# Patient Record
Sex: Male | Born: 1985 | Race: White | Hispanic: No | Marital: Married | Smoking: Current every day smoker
Health system: Southern US, Community
[De-identification: ages and names within clinical notes are randomized; demographics above are authoritative.]

## PROBLEM LIST (undated history)

## (undated) DIAGNOSIS — K56609 Unspecified intestinal obstruction, unspecified as to partial versus complete obstruction: Secondary | ICD-10-CM

## (undated) DIAGNOSIS — Z8739 Personal history of other diseases of the musculoskeletal system and connective tissue: Secondary | ICD-10-CM

## (undated) DIAGNOSIS — K529 Noninfective gastroenteritis and colitis, unspecified: Secondary | ICD-10-CM

## (undated) HISTORY — DX: Personal history of other diseases of the musculoskeletal system and connective tissue: Z87.39

## (undated) HISTORY — PX: CHOLECYSTECTOMY: SHX55

## (undated) HISTORY — PX: ABDOMINAL SURGERY: SHX537

## (undated) HISTORY — DX: Noninfective gastroenteritis and colitis, unspecified: K52.9

## (undated) HISTORY — PX: HERNIA REPAIR: SHX51

---

## 2013-06-30 ENCOUNTER — Other Ambulatory Visit (HOSPITAL_COMMUNITY): Payer: Self-pay | Admitting: Internal Medicine

## 2013-06-30 DIAGNOSIS — M549 Dorsalgia, unspecified: Secondary | ICD-10-CM

## 2013-07-02 ENCOUNTER — Ambulatory Visit (HOSPITAL_COMMUNITY): Payer: BC Managed Care – PPO

## 2013-08-26 ENCOUNTER — Ambulatory Visit (HOSPITAL_COMMUNITY): Payer: BC Managed Care – PPO

## 2013-10-27 ENCOUNTER — Encounter (HOSPITAL_COMMUNITY): Payer: Self-pay | Admitting: Emergency Medicine

## 2013-10-27 ENCOUNTER — Encounter (HOSPITAL_COMMUNITY): Payer: Self-pay

## 2013-10-27 ENCOUNTER — Inpatient Hospital Stay (HOSPITAL_COMMUNITY)
Admission: EM | Admit: 2013-10-27 | Discharge: 2013-10-31 | DRG: 390 | Disposition: A | Discharge status: Home / Self Care | Payer: BC Managed Care – PPO | Attending: Family Medicine | Admitting: Family Medicine

## 2013-10-27 ENCOUNTER — Other Ambulatory Visit (HOSPITAL_COMMUNITY): Payer: Self-pay | Admitting: Family Medicine

## 2013-10-27 ENCOUNTER — Ambulatory Visit (HOSPITAL_COMMUNITY)
Admission: RE | Admit: 2013-10-27 | Discharge: 2013-10-27 | Disposition: A | Discharge status: Home / Self Care | Payer: BC Managed Care – PPO | Source: Ambulatory Visit | Attending: Family Medicine | Admitting: Family Medicine

## 2013-10-27 DIAGNOSIS — D72829 Elevated white blood cell count, unspecified: Secondary | ICD-10-CM | POA: Diagnosis present

## 2013-10-27 DIAGNOSIS — M545 Low back pain, unspecified: Secondary | ICD-10-CM | POA: Diagnosis present

## 2013-10-27 DIAGNOSIS — R112 Nausea with vomiting, unspecified: Secondary | ICD-10-CM | POA: Diagnosis present

## 2013-10-27 DIAGNOSIS — R109 Unspecified abdominal pain: Secondary | ICD-10-CM

## 2013-10-27 DIAGNOSIS — Z9089 Acquired absence of other organs: Secondary | ICD-10-CM

## 2013-10-27 DIAGNOSIS — K56600 Partial intestinal obstruction, unspecified as to cause: Secondary | ICD-10-CM | POA: Diagnosis present

## 2013-10-27 DIAGNOSIS — G8929 Other chronic pain: Secondary | ICD-10-CM | POA: Diagnosis present

## 2013-10-27 DIAGNOSIS — K56609 Unspecified intestinal obstruction, unspecified as to partial versus complete obstruction: Principal | ICD-10-CM

## 2013-10-27 DIAGNOSIS — F17200 Nicotine dependence, unspecified, uncomplicated: Secondary | ICD-10-CM | POA: Diagnosis present

## 2013-10-27 LAB — CBC WITH DIFFERENTIAL/PLATELET
BASOS ABS: 0 10*3/uL (ref 0.0–0.1)
Basophils Relative: 0 % (ref 0–1)
Eosinophils Absolute: 0 10*3/uL (ref 0.0–0.7)
Eosinophils Relative: 0 % (ref 0–5)
HCT: 45.5 % (ref 39.0–52.0)
Hemoglobin: 15.5 g/dL (ref 13.0–17.0)
LYMPHS PCT: 4 % — AB (ref 12–46)
Lymphs Abs: 0.5 10*3/uL — ABNORMAL LOW (ref 0.7–4.0)
MCH: 30.2 pg (ref 26.0–34.0)
MCHC: 34.1 g/dL (ref 30.0–36.0)
MCV: 88.7 fL (ref 78.0–100.0)
Monocytes Absolute: 0.9 10*3/uL (ref 0.1–1.0)
Monocytes Relative: 7 % (ref 3–12)
NEUTROS ABS: 11.4 10*3/uL — AB (ref 1.7–7.7)
Neutrophils Relative %: 89 % — ABNORMAL HIGH (ref 43–77)
PLATELETS: 228 10*3/uL (ref 150–400)
RBC: 5.13 MIL/uL (ref 4.22–5.81)
RDW: 12.9 % (ref 11.5–15.5)
WBC: 12.8 10*3/uL — AB (ref 4.0–10.5)

## 2013-10-27 LAB — URINALYSIS, ROUTINE W REFLEX MICROSCOPIC
Bilirubin Urine: NEGATIVE
Glucose, UA: NEGATIVE mg/dL
KETONES UR: NEGATIVE mg/dL
Leukocytes, UA: NEGATIVE
NITRITE: NEGATIVE
Protein, ur: NEGATIVE mg/dL
Specific Gravity, Urine: 1.01 (ref 1.005–1.030)
Urobilinogen, UA: 0.2 mg/dL (ref 0.0–1.0)
pH: 6.5 (ref 5.0–8.0)

## 2013-10-27 LAB — BASIC METABOLIC PANEL
BUN: 19 mg/dL (ref 6–23)
CHLORIDE: 97 meq/L (ref 96–112)
CO2: 25 mEq/L (ref 19–32)
Calcium: 9.1 mg/dL (ref 8.4–10.5)
Creatinine, Ser: 0.98 mg/dL (ref 0.50–1.35)
GFR calc Af Amer: >90 mL/min (ref >90)
GFR calc non Af Amer: >90 mL/min (ref >90)
Glucose, Bld: 91 mg/dL (ref 70–99)
POTASSIUM: 4 meq/L (ref 3.7–5.3)
Sodium: 137 mEq/L (ref 137–147)

## 2013-10-27 LAB — URINE MICROSCOPIC-ADD ON

## 2013-10-27 LAB — LIPASE, BLOOD: LIPASE: 15 U/L (ref 11–59)

## 2013-10-27 MED ORDER — ONDANSETRON HCL 4 MG/2ML IJ SOLN
4.0000 mg | Freq: Four times a day (QID) | INTRAMUSCULAR | Status: DC | PRN
  Administered 2013-10-28 – 2013-10-29 (×5): 4 mg via INTRAVENOUS
  Filled 2013-10-27 (×5): qty 2

## 2013-10-27 MED ORDER — LORAZEPAM 2 MG/ML IJ SOLN
INTRAMUSCULAR | Status: AC
  Filled 2013-10-27: qty 1

## 2013-10-27 MED ORDER — HYDROMORPHONE HCL PF 1 MG/ML IJ SOLN
1.0000 mg | Freq: Once | INTRAMUSCULAR | Status: AC
  Administered 2013-10-27: 1 mg via INTRAVENOUS
  Filled 2013-10-27: qty 1

## 2013-10-27 MED ORDER — MORPHINE SULFATE 4 MG/ML IJ SOLN
4.0000 mg | INTRAMUSCULAR | Status: DC | PRN
  Administered 2013-10-27 – 2013-10-28 (×4): 4 mg via INTRAVENOUS
  Filled 2013-10-27 (×4): qty 1

## 2013-10-27 MED ORDER — METRONIDAZOLE IN NACL 5-0.79 MG/ML-% IV SOLN
500.0000 mg | Freq: Three times a day (TID) | INTRAVENOUS | Status: DC
  Administered 2013-10-27 – 2013-10-30 (×8): 500 mg via INTRAVENOUS
  Filled 2013-10-27 (×8): qty 100

## 2013-10-27 MED ORDER — HYDROMORPHONE HCL PF 1 MG/ML IJ SOLN: 1.0000 mg | INTRAMUSCULAR | Status: DC | PRN

## 2013-10-27 MED ORDER — KETOROLAC TROMETHAMINE 30 MG/ML IJ SOLN
30.0000 mg | Freq: Once | INTRAMUSCULAR | Status: AC
  Administered 2013-10-27: 30 mg via INTRAVENOUS
  Filled 2013-10-27: qty 1

## 2013-10-27 MED ORDER — ONDANSETRON HCL 4 MG PO TABS: 4.0000 mg | ORAL_TABLET | Freq: Four times a day (QID) | ORAL | Status: DC | PRN

## 2013-10-27 MED ORDER — SODIUM CHLORIDE 0.9 % IV SOLN
INTRAVENOUS | Status: AC
  Administered 2013-10-27: via INTRAVENOUS

## 2013-10-27 MED ORDER — IOHEXOL 300 MG/ML  SOLN
100.0000 mL | Freq: Once | INTRAMUSCULAR | Status: AC | PRN
  Administered 2013-10-27: 100 mL via INTRAVENOUS

## 2013-10-27 MED ORDER — HYDROMORPHONE HCL PF 1 MG/ML IJ SOLN
INTRAMUSCULAR | Status: AC
  Filled 2013-10-27: qty 1

## 2013-10-27 MED ORDER — SODIUM CHLORIDE 0.9 % IV BOLUS (SEPSIS)
1000.0000 mL | Freq: Once | INTRAVENOUS | Status: AC
  Administered 2013-10-27: 1000 mL via INTRAVENOUS

## 2013-10-27 MED ORDER — LORAZEPAM 2 MG/ML IJ SOLN: 1.0000 mg | Freq: Once | INTRAMUSCULAR | Status: DC

## 2013-10-27 MED ORDER — METRONIDAZOLE IN NACL 5-0.79 MG/ML-% IV SOLN: 500.0000 mg | Freq: Three times a day (TID) | INTRAVENOUS | Status: DC

## 2013-10-27 MED ORDER — HYDROMORPHONE HCL PF 1 MG/ML IJ SOLN
1.0000 mg | INTRAMUSCULAR | Status: DC | PRN
  Administered 2013-10-27 – 2013-10-28 (×3): 1 mg via INTRAVENOUS
  Filled 2013-10-27 (×2): qty 1

## 2013-10-27 MED ORDER — HEPARIN SODIUM (PORCINE) 5000 UNIT/ML IJ SOLN
5000.0000 [IU] | Freq: Three times a day (TID) | INTRAMUSCULAR | Status: DC
  Administered 2013-10-27 – 2013-10-29 (×5): 5000 [IU] via SUBCUTANEOUS
  Filled 2013-10-27 (×6): qty 1

## 2013-10-27 MED ORDER — ONDANSETRON HCL 4 MG/2ML IJ SOLN
4.0000 mg | Freq: Once | INTRAMUSCULAR | Status: AC
  Administered 2013-10-27: 4 mg via INTRAVENOUS
  Filled 2013-10-27: qty 2

## 2013-10-27 MED ORDER — CIPROFLOXACIN IN D5W 400 MG/200ML IV SOLN
400.0000 mg | Freq: Two times a day (BID) | INTRAVENOUS | Status: DC
  Administered 2013-10-27 – 2013-10-30 (×6): 400 mg via INTRAVENOUS
  Filled 2013-10-27 (×6): qty 200

## 2013-10-27 MED ORDER — SODIUM CHLORIDE 0.9 % IV SOLN
INTRAVENOUS | Status: DC
  Administered 2013-10-27 – 2013-10-29 (×4): via INTRAVENOUS
  Administered 2013-10-30: 50 mL/h via INTRAVENOUS
  Administered 2013-10-30 (×2): via INTRAVENOUS

## 2013-10-27 NOTE — ED Notes (Signed)
Patient reports waking with left flank pain and left abd pain. Per patient constant nausea and vomiting. Patient also reports diarrhea. Per patient was sent to hospital for CT. Per CT patient has partial small bowel obstruction and CT tech was instructed to check patient into ER. Patient given 100mg  demerol and 4mg  of zofran at PCP, per patient only brief relief from nausea and pain.

## 2013-10-27 NOTE — H&P (Addendum)
[facility] History and Physical  [name] IPJ:[id number] DOB: [birth year] DOA: 10/27/2013  Referring physician: ER. PCP: [name]., MD   Chief Complaint: Abdominal pain, nausea and vomiting.  HPI: [name] [name] is a 28 y.o. male  This is a 28 year old man who gives a 12-hour history of nausea, vomiting and abdominal pain mainly on the right side. This has also been associated with diarrhea today. He has had no fever. He describes that he had symptoms of the same approximately a month ago when he went to another hospital but was discharged from the emergency room. Since this time his bowels have not been opened on a regular basis and he still has had some degree of abdominal pain. He is a history of umbilical hernia repair as well as a cholecystectomy in the past.   Review of Systems:  Constitutional:  No weight loss, night sweats, Fevers, chills, fatigue.  HEENT:  No headaches, Difficulty swallowing,Tooth/dental problems,Sore throat,  No sneezing, itching, ear ache, nasal congestion, post nasal drip,  Cardio-vascular:  No chest pain, Orthopnea, PND, swelling in lower extremities, anasarca, dizziness, palpitations   Resp:  No shortness of breath with exertion or at rest. No excess mucus, no productive cough, No non-productive cough, No coughing up of blood.No change in color of mucus.No wheezing.No chest wall deformity  Skin:  no rash or lesions.  GU:  no dysuria, change in color of urine, no urgency or frequency. No flank pain.  Musculoskeletal:  No joint pain or swelling. No decreased range of motion. No back pain.  Psych:  No change in mood or affect. No depression or anxiety. No memory loss.   History reviewed. No pertinent past medical history. Past Surgical History  Procedure Laterality Date  . Cholecystectomy     Social History:  reports that he has been smoking Cigarettes.  He has a 7 pack-year smoking history. He has never used smokeless tobacco. He reports  that he does not drink alcohol or use illicit drugs.  No Known Allergies  History reviewed. No pertinent family history.   Prior to Admission medications   Medication Sig Start Date End Date Taking? Authorizing Provider  escitalopram (LEXAPRO) 20 MG tablet Take 20 mg by mouth daily.   Yes Historical Provider, MD  HYDROcodone-acetaminophen (NORCO) 10-325 MG per tablet Take 1 tablet by mouth every 6 (six) hours as needed for moderate pain or severe pain.   Yes Historical Provider, MD   Physical Exam: Filed Vitals:   10/27/13 2010  BP: 103/39  Pulse: 81  Temp:   Resp: 20    BP 103/39  Pulse 81  Temp(Src) 99 F (37.2 C) (Oral)  Resp 20  SpO2 97%  General:  Appears uncomfortable and in pain at rest. Eyes: PERRL, normal lids, irises & conjunctiva ENT: grossly normal hearing, lips & tongue Neck: no LAD, masses or thyromegaly Cardiovascular: RRR, no m/r/g. No LE edema. Telemetry: SR, no arrhythmias  Respiratory: CTA bilaterally, no w/r/r. Normal respiratory effort. Abdomen: Tender in the right mid and lower quadrants with percussion tenderness. The abdomen is slightly distended. Skin: no rash or induration seen on limited exam Musculoskeletal: grossly normal tone BUE/BLE Psychiatric: grossly normal mood and affect, speech fluent and appropriate Neurologic: grossly non-focal.          Labs on Admission:  Basic Metabolic Panel:  Recent Labs Lab 10/27/13 1837  NA 137  K 4.0  CL 97  CO2 25  GLUCOSE 91  BUN 19  CREATININE 0.98  CALCIUM  9.1      Recent Labs Lab 10/27/13 1837  LIPASE 15    CBC:  Recent Labs Lab 10/27/13 1837  WBC 12.8*  NEUTROABS 11.4*  HGB 15.5  HCT 45.5  MCV 88.7  PLT 228   :   Radiological Exams on Admission: Ct Abdomen Pelvis W Contrast  10/27/2013   CLINICAL DATA:  Right lower quadrant pain  EXAM: CT ABDOMEN AND PELVIS WITH CONTRAST  TECHNIQUE: Multidetector CT imaging of the abdomen and pelvis was performed using the standard  protocol following bolus administration of intravenous contrast.  CONTRAST:  147mL OMNIPAQUE IOHEXOL 300 MG/ML  SOLN  COMPARISON:  CT ABD-PELV W/ CM dated 01/03/2011; CT ABD-PELV W/ CM dated 04/18/2007  FINDINGS: The lung bases are clear.  The liver demonstrates no focal abnormality. There is no intrahepatic or extrahepatic biliary ductal dilatation. The gallbladder is surgically absent. The spleen demonstrates no focal abnormality. The kidneys, adrenal glands and pancreas are normal. The bladder is unremarkable.  There are air-fluid levels present within the small bowel and colon. There is the mild small bowel dilatation in the mid right abdomen measuring up to 3.4 cm in diameter involving the proximal jejunum. There is a kinked appearance of the small bowel loop which may be secondary to adhesions. There is gastric distension. There is a normal caliber appendix in the right lower quadrant without periappendiceal inflammatory changes. There is no pneumoperitoneum, pneumatosis, or portal venous gas. There is no abdominal or pelvic free fluid. There is no lymphadenopathy.  The abdominal aorta is normal in caliber.  There are no lytic or sclerotic osseous lesions.  IMPRESSION: 1. Air-fluid levels throughout the small bowel and colon with mild small bowel dilatation involving the proximal jejunum in the mid right abdomen measuring up to 3.4 cm with a kinked appearance. The appearance is concerning for a partial small bowel obstruction with possible concomitant ileus versus enterocolitis. .   Electronically Signed   By: Kathreen Devoid   On: 10/27/2013 17:04      Assessment/Plan   1. Abdominal pain probably related to his partial small bowel obstruction but this is concerning for an inflamed viscus. 2. Partial small bowel obstruction history of previous abdominal surgery.  Plan: 1. Admit to medical floor. 2. N.p.o. empirical intravenous antibiotics with ciprofloxacin and metronidazole. 3. Intravenous fluids and  intravenous analgesia as well as antiemetics as required. 4. Surgical consultation.  Further recommendations will depend on patient's hospital progress.  Code Status: Full code   Family Communication: Discuss plan with patient and patient's mother at the bedside.   Disposition Plan: Home in medically stable.  Time spent: 30 minutes.  Doree Albee [facility] [phone].

## 2013-10-27 NOTE — ED Provider Notes (Signed)
CSN: [id number]     Arrival date & time 10/27/13  1720 History   First MD Initiated Contact with Patient 10/27/13 1812     Chief Complaint  Patient presents with  . Flank Pain  . Abdominal Pain     (Consider location/radiation/quality/duration/timing/severity/associated sxs/prior Treatment) Patient is a 28 y.o. male presenting with flank pain and abdominal pain. The history is provided by the patient.  Flank Pain Associated symptoms include abdominal pain.  Abdominal Pain  He has had episodic pain for 3 weeks. The pain returned, this morning, and was associated with frequent episodes of vomiting. Emesis was yellow colored. He also has had frequent stooling today, brown in color, and thin. His abdominal pain is mostly right sided. He denies known sick contacts. He denies fever, chills, cough, shortness of breath, chest pain, weakness, or dizziness. He saw his PCP, earlier today, and was treated with IM medications. He was then sent to the hospital for a CT scan. The CT scan returned and was abnormal, so he was sent here for treatment. There are no other known modifying factors.  History reviewed. No pertinent past medical history. Past Surgical History  Procedure Laterality Date  . Cholecystectomy     History reviewed. No pertinent family history. History  Substance Use Topics  . Smoking status: Current Every Day Smoker -- 1.00 packs/day for 7 years    Types: Cigarettes  . Smokeless tobacco: Never Used  . Alcohol Use: No    Review of Systems  Gastrointestinal: Positive for abdominal pain.  Genitourinary: Positive for flank pain.  All other systems reviewed and are negative.      Allergies  Review of patient's allergies indicates no known allergies.  Home Medications   Current Outpatient Rx  Name  Route  Sig  Dispense  Refill  . escitalopram (LEXAPRO) 20 MG tablet   Oral   Take 20 mg by mouth daily.         Marland Kitchen HYDROcodone-acetaminophen (NORCO) 10-325 MG per tablet    Oral   Take 1 tablet by mouth every 6 (six) hours as needed for moderate pain or severe pain.          BP 103/39  Pulse 81  Temp(Src) 99 F (37.2 C) (Oral)  Resp 20  SpO2 97% Physical Exam  Nursing note and vitals reviewed. Constitutional: He is oriented to person, place, and time. He appears well-developed and well-nourished. He appears distressed (He is uncomfortable).  HENT:  Head: Normocephalic and atraumatic.  Right Ear: External ear normal.  Left Ear: External ear normal.  Eyes: Conjunctivae and EOM are normal. Pupils are equal, round, and reactive to light.  Neck: Normal range of motion and phonation normal. Neck supple.  Cardiovascular: Normal rate, regular rhythm, normal heart sounds and intact distal pulses.   Pulmonary/Chest: Effort normal and breath sounds normal. He exhibits no bony tenderness.  Abdominal: Soft. Normal appearance. He exhibits no mass. There is tenderness (Diffuse tenderness, increased in the right upper quadrant). There is no rebound and no guarding.  Hyperactive bowel sounds  Musculoskeletal: Normal range of motion.  Neurological: He is alert and oriented to person, place, and time. No cranial nerve deficit or sensory deficit. He exhibits normal muscle tone. Coordination normal.  Skin: Skin is warm, dry and intact.  Psychiatric: He has a normal mood and affect. His behavior is normal. Judgment and thought content normal.    ED Course  Procedures (including critical care time)  Medications  0.9 %  sodium  chloride infusion ( Intravenous New Bag/Given 10/27/13 1946)  sodium chloride 0.9 % bolus 1,000 mL (0 mLs Intravenous Stopped 10/27/13 1944)  ondansetron (ZOFRAN) injection 4 mg (4 mg Intravenous Given 10/27/13 1850)  HYDROmorphone (DILAUDID) injection 1 mg (1 mg Intravenous Given 10/27/13 1851)  HYDROmorphone (DILAUDID) injection 1 mg (1 mg Intravenous Given 10/27/13 1944)    Patient Vitals for the past 24 hrs:  BP Temp Temp src Pulse Resp SpO2   10/27/13 2010 103/39 mmHg - Other 81 20 97 %  10/27/13 1924 102/47 mmHg 99 F (37.2 C) Oral 95 - 100 %  10/27/13 1906 119/55 mmHg - - 79 18 100 %  10/27/13 1807 126/50 mmHg 98.1 F (36.7 C) Oral 91 20 100 %   1833- consultation with general surgery; Dr. Arnoldo Morale. He feels that since the patient is stooling that he will not require aggressive treatment, including surgery, at this time. He will see the patient on an as-needed basis.   8:33 PM Reevaluation with update and discussion. After initial assessment and treatment, an updated evaluation reveals he has required a second dose of analgesia. He only has transient relief with this. He has continued to have frequent stools here in the emergency department. His nausea is improved with Zofran, and he has not vomited since 3:30 today. Findings discussed with patient and his mother, all questions answered. Vidur Knust L   8:35 PM-Consult complete with hospitalist, Dr.  Anastasio Champion . Patient case explained and discussed. He agrees to admit patient for further evaluation and treatment. Call ended at [place] Reviewed  CBC WITH DIFFERENTIAL - Abnormal; Notable for the following:    WBC 12.8 (*)    Neutrophils Relative % 89 (*)    Neutro Abs 11.4 (*)    Lymphocytes Relative 4 (*)    Lymphs Abs 0.5 (*)    All other components within normal limits  URINALYSIS, ROUTINE W REFLEX MICROSCOPIC - Abnormal; Notable for the following:    Hgb urine dipstick SMALL (*)    All other components within normal limits  BASIC METABOLIC PANEL  LIPASE, BLOOD  URINE MICROSCOPIC-ADD ON   Imaging Review Ct Abdomen Pelvis W Contrast  10/27/2013   CLINICAL DATA:  Right lower quadrant pain  EXAM: CT ABDOMEN AND PELVIS WITH CONTRAST  TECHNIQUE: Multidetector CT imaging of the abdomen and pelvis was performed using the standard protocol following bolus administration of intravenous contrast.  CONTRAST:  187mL OMNIPAQUE IOHEXOL 300 MG/ML  SOLN  COMPARISON:  CT  ABD-PELV W/ CM dated 01/03/2011; CT ABD-PELV W/ CM dated 04/18/2007  FINDINGS: The lung bases are clear.  The liver demonstrates no focal abnormality. There is no intrahepatic or extrahepatic biliary ductal dilatation. The gallbladder is surgically absent. The spleen demonstrates no focal abnormality. The kidneys, adrenal glands and pancreas are normal. The bladder is unremarkable.  There are air-fluid levels present within the small bowel and colon. There is the mild small bowel dilatation in the mid right abdomen measuring up to 3.4 cm in diameter involving the proximal jejunum. There is a kinked appearance of the small bowel loop which may be secondary to adhesions. There is gastric distension. There is a normal caliber appendix in the right lower quadrant without periappendiceal inflammatory changes. There is no pneumoperitoneum, pneumatosis, or portal venous gas. There is no abdominal or pelvic free fluid. There is no lymphadenopathy.  The abdominal aorta is normal in caliber.  There are no lytic or sclerotic osseous lesions.  IMPRESSION: 1. Air-fluid levels  throughout the small bowel and colon with mild small bowel dilatation involving the proximal jejunum in the mid right abdomen measuring up to 3.4 cm with a kinked appearance. The appearance is concerning for a partial small bowel obstruction with possible concomitant ileus versus enterocolitis. .   Electronically Signed   By: Kathreen Devoid   On: 10/27/2013 17:04      MDM   Final diagnoses:  Partial small bowel obstruction  Abdominal pain    Abdominal pain, with vomiting and diarrhea; and CT evidence for partial SBO. No current indication for NG drainage. Persistent pain will require admission for further treatment in an observed setting; hospital.  Nursing Notes Reviewed/ Care Coordinated, and agree without changes. Applicable Imaging Reviewed.  Interpretation of Laboratory Data incorporated into ED treatment  Plan: admit    Richarda Blade, MD 10/28/13 1110

## 2013-10-28 ENCOUNTER — Observation Stay (HOSPITAL_COMMUNITY): Payer: BC Managed Care – PPO

## 2013-10-28 DIAGNOSIS — R112 Nausea with vomiting, unspecified: Secondary | ICD-10-CM

## 2013-10-28 DIAGNOSIS — R109 Unspecified abdominal pain: Secondary | ICD-10-CM

## 2013-10-28 DIAGNOSIS — K56609 Unspecified intestinal obstruction, unspecified as to partial versus complete obstruction: Secondary | ICD-10-CM

## 2013-10-28 LAB — COMPREHENSIVE METABOLIC PANEL
ALBUMIN: 3.1 g/dL — AB (ref 3.5–5.2)
ALK PHOS: 41 U/L (ref 39–117)
ALT: 27 U/L (ref 0–53)
AST: 26 U/L (ref 0–37)
BUN: 14 mg/dL (ref 6–23)
CO2: 28 mEq/L (ref 19–32)
Calcium: 7.5 mg/dL — ABNORMAL LOW (ref 8.4–10.5)
Chloride: 102 mEq/L (ref 96–112)
Creatinine, Ser: 0.98 mg/dL (ref 0.50–1.35)
GFR calc Af Amer: >90 mL/min (ref >90)
GFR calc non Af Amer: >90 mL/min (ref >90)
GLUCOSE: 89 mg/dL (ref 70–99)
POTASSIUM: 3.4 meq/L — AB (ref 3.7–5.3)
SODIUM: 139 meq/L (ref 137–147)
Total Bilirubin: 1.6 mg/dL — ABNORMAL HIGH (ref 0.3–1.2)
Total Protein: 6.2 g/dL (ref 6.0–8.3)

## 2013-10-28 LAB — CBC
HEMATOCRIT: 39.9 % (ref 39.0–52.0)
Hemoglobin: 13.3 g/dL (ref 13.0–17.0)
MCH: 29.9 pg (ref 26.0–34.0)
MCHC: 33.3 g/dL (ref 30.0–36.0)
MCV: 89.7 fL (ref 78.0–100.0)
Platelets: 187 10*3/uL (ref 150–400)
RBC: 4.45 MIL/uL (ref 4.22–5.81)
RDW: 13 % (ref 11.5–15.5)
WBC: 6.5 10*3/uL (ref 4.0–10.5)

## 2013-10-28 MED ORDER — DIPHENHYDRAMINE HCL 50 MG/ML IJ SOLN
25.0000 mg | Freq: Once | INTRAMUSCULAR | Status: AC
  Administered 2013-10-28: 25 mg via INTRAVENOUS
  Filled 2013-10-28: qty 1

## 2013-10-28 MED ORDER — HYDROMORPHONE HCL PF 1 MG/ML IJ SOLN
1.0000 mg | INTRAMUSCULAR | Status: DC | PRN
  Administered 2013-10-28 – 2013-10-29 (×18): 2 mg via INTRAVENOUS
  Administered 2013-10-29: 1 mg via INTRAVENOUS
  Administered 2013-10-30 (×3): 2 mg via INTRAVENOUS
  Administered 2013-10-30: 1 mg via INTRAVENOUS
  Filled 2013-10-28 (×2): qty 2
  Filled 2013-10-28: qty 1
  Filled 2013-10-28 (×17): qty 2
  Filled 2013-10-28: qty 1
  Filled 2013-10-28 (×2): qty 2

## 2013-10-28 MED ORDER — PANTOPRAZOLE SODIUM 40 MG IV SOLR
40.0000 mg | INTRAVENOUS | Status: DC
Start: 2013-10-28 — End: 2013-10-30
  Administered 2013-10-28 – 2013-10-29 (×2): 40 mg via INTRAVENOUS
  Filled 2013-10-28 (×2): qty 40

## 2013-10-28 MED ORDER — PHENOL 1.4 % MT LIQD
1.0000 | OROMUCOSAL | Status: DC | PRN
  Administered 2013-10-28: 1 via OROMUCOSAL
  Filled 2013-10-28: qty 177

## 2013-10-28 MED ORDER — INFLUENZA VAC SPLIT QUAD 0.5 ML IM SUSP
0.5000 mL | INTRAMUSCULAR | Status: DC
  Filled 2013-10-28: qty 0.5

## 2013-10-28 NOTE — Consult Note (Signed)
Referring Provider: No ref. provider found Primary Care Physician:  [name]., MD Primary Gastroenterologist:  Dr. Laural Golden  Reason for Consultation:  Abdominal pain nausea vomiting and abnormal abdominopelvic CT.  HPI:  History is provided by the patient and his wife and mother. Patient is 28 year old Caucasian male who was in usual state of health about a month ago when he developed diarrhea nausea and vomiting while at work. He went to the emergency room at [facility] in [facility]. He he got frustrated after having to wait for 6 hours and left without being seen. Over the next day or 2 he felt better and return to work. He has had intermittent diarrhea but no nausea vomiting abdominal pain melena or rectal bleeding. Yesterday morning he developed severe abdominal pain primarily in the right upper quadrant associated with nausea vomiting and diarrhea. He states pain is worse when he takes a deep breath or coughs. He had 3 loose stools. He did vomit scant amount of dark blood. He did not experience fever.  Initial blood work revealed mild leukocytosis with WBC of 12.8 and 89% neutrophils. Metabolic 7 and serum lipase were normal. Abdominal pelvic CT revealed air fluid levels involving small and large bowel and dilation of proximal small bowel; radiologist raised concern for a kink. Patient was begun on IV fluids analgesia antibiotics and IV antibiotics. However he has continued to complain of pain which is not controlled with pain medications. He feels the pain has gotten worse. NG tube was placed today so far he has not felt any better. His bowels have not moved since 10 PM last night and he also has not passed any flatus. Review of the systems is as follows ; He has not lost any weight since he had an episode one month ago. He does not take OTC NSAIDs. He has chronic low back pain for which she takes pain medications but not on daily basis. He has had a rash involving proximal thighs which  is being treated with topical medication.  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 He drinks alcohol occasionally and smokes cigarettes about a pack a day. History reviewed. No pertinent past medical history.  Past Surgical History  Procedure Laterality Date  . Cholecystectomy at age 57 for poorly functioning gallbladder by Dr. Anthony Sar.    Umbilical herniorrhaphy at age 57. He has chronic low back pain which started after the fell off the roof 5 or 6 years ago. His mother states he has spondylosis.  Prior to Admission medications   Medication Sig Start Date End Date Taking? Authorizing Provider  escitalopram (LEXAPRO) 20 MG tablet Take 20 mg by mouth daily.   Yes Historical Provider, MD  HYDROcodone-acetaminophen (NORCO) 10-325 MG per tablet Take 1 tablet by mouth every 6 (six) hours as needed for moderate pain or severe pain.   Yes Historical Provider, MD    Current Facility-Administered Medications  Medication Dose Route Frequency Provider Last Rate Last Dose  . 0.9 %  sodium chloride infusion   Intravenous Continuous Richarda Blade, MD 125 mL/hr at 10/27/13 1946    . ciprofloxacin (CIPRO) IVPB 400 mg  400 mg Intravenous Q12H Nimish C Gosrani, MD   400 mg at 10/28/13 0937  . heparin injection 5,000 Units  5,000 Units Subcutaneous 3 times per day Doree Albee, MD   5,000 Units at 10/28/13 1414  . HYDROmorphone (DILAUDID) injection 1-2 mg  1-2 mg Intravenous Q2H PRN Nita Sells, MD  2 mg at 10/28/13 1739  . LORazepam (ATIVAN) injection 1 mg  1 mg Intravenous Once Jeryl Columbia, NP      . metroNIDAZOLE (FLAGYL) IVPB 500 mg  500 mg Intravenous Q8H Nimish C Gosrani, MD   500 mg at 10/28/13 1415  . ondansetron (ZOFRAN) tablet 4 mg  4 mg Oral Q6H PRN Nimish C Gosrani, MD       Or  . ondansetron (ZOFRAN) injection 4 mg  4 mg Intravenous Q6H PRN Doree Albee, MD   4 mg at 10/28/13 0934  . phenol (CHLORASEPTIC) mouth spray 1 spray  1 spray Mouth/Throat PRN  Nita Sells, MD   1 spray at 10/28/13 1215    Allergies as of 10/27/2013  . (No Known Allergies)    History reviewed. No pertinent family history.  History   Social History  . Marital Status: Married    Spouse Name: N/A    Number of Children: N/A  . Years of Education: N/A   Occupational History  . Not on file.   Social History Main Topics  . Smoking status: Current Every Day Smoker -- 1.00 packs/day for 7 years    Types: Cigarettes  . Smokeless tobacco: Never Used  . Alcohol Use: No  . Drug Use: No  . Sexual Activity: Not on file   Other Topics Concern  . Not on file   Social History Narrative  . No narrative on file    Review of Systems: See HPI, otherwise normal ROS  Physical Exam: Temp:  [97.4 F (36.3 C)-99 F (37.2 C)] 97.4 F (36.3 C) (03/18 1427) Pulse Rate:  [66-95] 70 (03/18 1427) Resp:  [15-20] 20 (03/18 1427) BP: (99-126)/(39-55) 105/45 mmHg (03/18 1427) SpO2:  [95 %-100 %] 95 % (03/18 1427) Weight:  [200 lb 11.2 oz (91.037 kg)] 200 lb 11.2 oz (91.037 kg) (03/18 1057) Last BM Date: 10/27/13 Well-developed well-nourished Caucasian male who is alert and appears to be uncomfortable. He is coughing and spitting up thick saliva. NG tube is in place draining bilious fluid. Conjunctiva is pink. Sclerae nonicteric. Oropharyngeal mucosa is normal. No neck masses or thyromegaly noted. Cardiac exam with regular rhythm normal S1 and S2. No murmur or gallop noted. Lungs are clear to auscultation. Abdomen is full and symmetrical. Bowel sounds are hypoactive. Abdomen is soft with mild tenderness across cross lower abdomen and at LUQ. He has moderate tenderness with guarding in the right upper quadrant. No organomegaly or masses noted. He does not have clubbing or peripheral edema but he had maculopapular rash involving the medial aspect of both thighs.  Intake/Output from previous day: 03/17 0701 - 03/18 0700 In: 1000 [I.V.:1000] Out: -   Intake/Output this shift: Total I/O In: 300 [IV Piggyback:300] Out: 550 [Urine:550]  Lab Results:  Recent Labs  10/27/13 1837 10/28/13 0515  WBC 12.8* 6.5  HGB 15.5 13.3  HCT 45.5 39.9  PLT 228 187   BMET  Recent Labs  10/27/13 1837 10/28/13 0515  NA 137 139  K 4.0 3.4*  CL 97 102  CO2 25 28  GLUCOSE 91 89  BUN 19 14  CREATININE 0.98 0.98  CALCIUM 9.1 7.5*   LFT  Recent Labs  10/28/13 0515  PROT 6.2  ALBUMIN 3.1*  AST 26  ALT 27  ALKPHOS 41  BILITOT 1.6*   PT/INR No results found for this basename: LABPROT, INR,  in the last 72 hours Hepatitis Panel No results found for this basename: HEPBSAG, [place], HEPAIGM,  HEPBIGM,  in the last 72 hours  Studies/Results: Ct Abdomen Pelvis W Contrast  10/27/2013   CLINICAL DATA:  Right lower quadrant pain  EXAM: CT ABDOMEN AND PELVIS WITH CONTRAST  TECHNIQUE: Multidetector CT imaging of the abdomen and pelvis was performed using the standard protocol following bolus administration of intravenous contrast.  CONTRAST:  13mL OMNIPAQUE IOHEXOL 300 MG/ML  SOLN  COMPARISON:  CT ABD-PELV W/ CM dated 01/03/2011; CT ABD-PELV W/ CM dated 04/18/2007  FINDINGS: The lung bases are clear.  The liver demonstrates no focal abnormality. There is no intrahepatic or extrahepatic biliary ductal dilatation. The gallbladder is surgically absent. The spleen demonstrates no focal abnormality. The kidneys, adrenal glands and pancreas are normal. The bladder is unremarkable.  There are air-fluid levels present within the small bowel and colon. There is the mild small bowel dilatation in the mid right abdomen measuring up to 3.4 cm in diameter involving the proximal jejunum. There is a kinked appearance of the small bowel loop which may be secondary to adhesions. There is gastric distension. There is a normal caliber appendix in the right lower quadrant without periappendiceal inflammatory changes. There is no pneumoperitoneum, pneumatosis, or portal venous  gas. There is no abdominal or pelvic free fluid. There is no lymphadenopathy.  The abdominal aorta is normal in caliber.  There are no lytic or sclerotic osseous lesions.  IMPRESSION: 1. Air-fluid levels throughout the small bowel and colon with mild small bowel dilatation involving the proximal jejunum in the mid right abdomen measuring up to 3.4 cm with a kinked appearance. The appearance is concerning for a partial small bowel obstruction with possible concomitant ileus versus enterocolitis. .   Electronically Signed   By: Kathreen Devoid   On: 10/27/2013 17:04   Dg Abd 2 Views  10/28/2013   CLINICAL DATA:  Abdominal pain with nausea and vomiting  EXAM: ABDOMEN - 2 VIEW  COMPARISON:  October 27, 2013  FINDINGS: Supine and upright images were obtained. There remains multiple loops of mildly dilated small bowel with scattered air-fluid levels. Contrast does reach the colon and is largely within the colon currently. There is no free air. Liver is prominent. There are surgical clips in the gallbladder fossa region.  IMPRESSION: Bowel gas pattern remains concerning for a degree of bowel obstruction. Contrast reaches the colon, indicating that bowel obstruction is partial as opposed to complete. Liver is prominent. No free air.   Electronically Signed   By: Lowella Grip M.D.   On: 10/28/2013 08:40    Assessment; Patient is 28 year old Caucasian male who presents with acute onset of abdominal pain centered in the right upper quadrant associated with nausea and vomiting. He had an episode of nausea vomiting and diarrhea about a month ago for which he went to emergency room at [facility] and left after waiting for 6 hours. He has had intermittent diarrhea since then. Current symptoms began with pain yesterday morning leading to admission last night. Workup revealed dilated loops of small bowel centered in the right upper quadrant along with air-fluid levels in the colon and abdominal series from today reveal contrast in  the colon and dilated loops of small intestine. NG tube was placed earlier today but he has not experienced relief of pain. Abdominal exam reveals hypoactive bowel sounds soft abdomen with generalized tenderness but more so in the right upper quadrant with guarding. His presentation is suspicious for partial small bowel obstruction and I doubt that we're dealing with enterocolitis. I am concerned that  he has partial obstruction secondary to adhesions or could have internal herniation. Therefore surgical consultation will be requested. He also  experienced hematemesis yesterday which would appear to be most likely due to Mallory-Weiss tear.   Recommendations; Surgical consultation. I have contacted Dr. Aviva Signs and he will evaluate patient later this evening. Pantoprazole 40 mg IV every 24 hours. CBC and metabolic 7 in am.   LOS: 1 day   [name],NAJEEB U  10/28/2013, 5:42 PM

## 2013-10-28 NOTE — Consult Note (Signed)
Reason for Consult: Abdominal pain Referring Physician: [facility]  [name] [name] is an 28 y.o. male.  HPI: Patient is a 28 year old white male who presents with worsening abdominal pain and intermittent diarrhea. Please see Dr. Olevia Perches note concerning his history. I have been asked to see him as he continues to have abdominal pain requiring frequent narcotic dosing. An NG tube has been placed, but he states that his pain has not significantly improved. He has not had a bowel movement or flatus since being admitted to the hospital. He did have a followup KUB today which showed contrast from his previous CT scan to be within the colon. There was some air within the small bowel, but no worsening dilatation as compared to the CT scan done yesterday evening. His white blood cell count has normalized. He has had a previous laparoscopic cholecystectomy and umbilical herniorrhaphy in the remote past. He points to discomfort in the right upper quadrant of the abdomen.  History reviewed. No pertinent past medical history.  Past Surgical History  Procedure Laterality Date  . Cholecystectomy      History reviewed. No pertinent family history.  Social History:  reports that he has been smoking Cigarettes.  He has a 7 pack-year smoking history. He has never used smokeless tobacco. He reports that he does not drink alcohol or use illicit drugs.  Allergies: No Known Allergies  Medications: I have reviewed the patient's current medications.  Results for orders placed during the hospital encounter of 10/27/13 (from the past 48 hour(s))  CBC WITH DIFFERENTIAL     Status: Abnormal   Collection Time    10/27/13  6:37 PM      Result Value Ref Range   WBC 12.8 (*) 4.0 - 10.5 K/uL   RBC 5.13  4.22 - 5.81 MIL/uL   Hemoglobin 15.5  13.0 - 17.0 g/dL   HCT 45.5  39.0 - 52.0 %   MCV 88.7  78.0 - 100.0 fL   MCH 30.2  26.0 - 34.0 pg   MCHC 34.1  30.0 - 36.0 g/dL   RDW 12.9  11.5 - 15.5 %   Platelets 228   150 - 400 K/uL   Neutrophils Relative % 89 (*) 43 - 77 %   Neutro Abs 11.4 (*) 1.7 - 7.7 K/uL   Lymphocytes Relative 4 (*) 12 - 46 %   Lymphs Abs 0.5 (*) 0.7 - 4.0 K/uL   Monocytes Relative 7  3 - 12 %   Monocytes Absolute 0.9  0.1 - 1.0 K/uL   Eosinophils Relative 0  0 - 5 %   Eosinophils Absolute 0.0  0.0 - 0.7 K/uL   Basophils Relative 0  0 - 1 %   Basophils Absolute 0.0  0.0 - 0.1 K/uL  BASIC METABOLIC PANEL     Status: None   Collection Time    10/27/13  6:37 PM      Result Value Ref Range   Sodium 137  137 - 147 mEq/L   Potassium 4.0  3.7 - 5.3 mEq/L   Chloride 97  96 - 112 mEq/L   CO2 25  19 - 32 mEq/L   Glucose, Bld 91  70 - 99 mg/dL   BUN 19  6 - 23 mg/dL   Creatinine, Ser 0.98  0.50 - 1.35 mg/dL   Calcium 9.1  8.4 - 10.5 mg/dL   GFR calc non Af Amer >90  >90 mL/min   GFR calc Af Amer >90  >90 mL/min  Comment: (NOTE)     The eGFR has been calculated using the CKD EPI equation.     This calculation has not been validated in all clinical situations.     eGFR's persistently <90 mL/min signify possible Chronic Kidney     Disease.  LIPASE, BLOOD     Status: None   Collection Time    10/27/13  6:37 PM      Result Value Ref Range   Lipase 15  11 - 59 U/L  URINALYSIS, ROUTINE W REFLEX MICROSCOPIC     Status: Abnormal   Collection Time    10/27/13  7:28 PM      Result Value Ref Range   Color, Urine YELLOW  YELLOW   APPearance CLEAR  CLEAR   Specific Gravity, Urine 1.010  1.005 - 1.030   pH 6.5  5.0 - 8.0   Glucose, UA NEGATIVE  NEGATIVE mg/dL   Hgb urine dipstick SMALL (*) NEGATIVE   Bilirubin Urine NEGATIVE  NEGATIVE   Ketones, ur NEGATIVE  NEGATIVE mg/dL   Protein, ur NEGATIVE  NEGATIVE mg/dL   Urobilinogen, UA 0.2  0.0 - 1.0 mg/dL   Nitrite NEGATIVE  NEGATIVE   Leukocytes, UA NEGATIVE  NEGATIVE  URINE MICROSCOPIC-ADD ON     Status: None   Collection Time    10/27/13  7:28 PM      Result Value Ref Range   Squamous Epithelial / LPF RARE  RARE   WBC, UA 0-2   <3 WBC/hpf   RBC / HPF 0-2  <3 RBC/hpf   Bacteria, UA RARE  RARE  COMPREHENSIVE METABOLIC PANEL     Status: Abnormal   Collection Time    10/28/13  5:15 AM      Result Value Ref Range   Sodium 139  137 - 147 mEq/L   Potassium 3.4 (*) 3.7 - 5.3 mEq/L   Chloride 102  96 - 112 mEq/L   CO2 28  19 - 32 mEq/L   Glucose, Bld 89  70 - 99 mg/dL   BUN 14  6 - 23 mg/dL   Creatinine, Ser 0.98  0.50 - 1.35 mg/dL   Calcium 7.5 (*) 8.4 - 10.5 mg/dL   Total Protein 6.2  6.0 - 8.3 g/dL   Albumin 3.1 (*) 3.5 - 5.2 g/dL   AST 26  0 - 37 U/L   ALT 27  0 - 53 U/L   Alkaline Phosphatase 41  39 - 117 U/L   Total Bilirubin 1.6 (*) 0.3 - 1.2 mg/dL   GFR calc non Af Amer >90  >90 mL/min   GFR calc Af Amer >90  >90 mL/min   Comment: (NOTE)     The eGFR has been calculated using the CKD EPI equation.     This calculation has not been validated in all clinical situations.     eGFR's persistently <90 mL/min signify possible Chronic Kidney     Disease.  CBC     Status: None   Collection Time    10/28/13  5:15 AM      Result Value Ref Range   WBC 6.5  4.0 - 10.5 K/uL   RBC 4.45  4.22 - 5.81 MIL/uL   Hemoglobin 13.3  13.0 - 17.0 g/dL   HCT 39.9  39.0 - 52.0 %   MCV 89.7  78.0 - 100.0 fL   MCH 29.9  26.0 - 34.0 pg   MCHC 33.3  30.0 - 36.0 g/dL   RDW 13.0  11.5 - 15.5 %   Platelets 187  150 - 400 K/uL    Ct Abdomen Pelvis W Contrast  10/27/2013   CLINICAL DATA:  Right lower quadrant pain  EXAM: CT ABDOMEN AND PELVIS WITH CONTRAST  TECHNIQUE: Multidetector CT imaging of the abdomen and pelvis was performed using the standard protocol following bolus administration of intravenous contrast.  CONTRAST:  185m OMNIPAQUE IOHEXOL 300 MG/ML  SOLN  COMPARISON:  CT ABD-PELV W/ CM dated 01/03/2011; CT ABD-PELV W/ CM dated 04/18/2007  FINDINGS: The lung bases are clear.  The liver demonstrates no focal abnormality. There is no intrahepatic or extrahepatic biliary ductal dilatation. The gallbladder is surgically absent.  The spleen demonstrates no focal abnormality. The kidneys, adrenal glands and pancreas are normal. The bladder is unremarkable.  There are air-fluid levels present within the small bowel and colon. There is the mild small bowel dilatation in the mid right abdomen measuring up to 3.4 cm in diameter involving the proximal jejunum. There is a kinked appearance of the small bowel loop which may be secondary to adhesions. There is gastric distension. There is a normal caliber appendix in the right lower quadrant without periappendiceal inflammatory changes. There is no pneumoperitoneum, pneumatosis, or portal venous gas. There is no abdominal or pelvic free fluid. There is no lymphadenopathy.  The abdominal aorta is normal in caliber.  There are no lytic or sclerotic osseous lesions.  IMPRESSION: 1. Air-fluid levels throughout the small bowel and colon with mild small bowel dilatation involving the proximal jejunum in the mid right abdomen measuring up to 3.4 cm with a kinked appearance. The appearance is concerning for a partial small bowel obstruction with possible concomitant ileus versus enterocolitis. .   Electronically Signed   By: HKathreen Devoid  On: 10/27/2013 17:04   Dg Abd 2 Views  10/28/2013   CLINICAL DATA:  Abdominal pain with nausea and vomiting  EXAM: ABDOMEN - 2 VIEW  COMPARISON:  October 27, 2013  FINDINGS: Supine and upright images were obtained. There remains multiple loops of mildly dilated small bowel with scattered air-fluid levels. Contrast does reach the colon and is largely within the colon currently. There is no free air. Liver is prominent. There are surgical clips in the gallbladder fossa region.  IMPRESSION: Bowel gas pattern remains concerning for a degree of bowel obstruction. Contrast reaches the colon, indicating that bowel obstruction is partial as opposed to complete. Liver is prominent. No free air.   Electronically Signed   By: WLowella GripM.D.   On: 10/28/2013 08:40    ROS:  See chart Blood pressure 104/51, pulse 93, temperature 98.1 F (36.7 C), temperature source Oral, resp. rate 20, height 5' 10"  (1.778 m), weight 91.037 kg (200 lb 11.2 oz), SpO2 93.00%. Physical Exam: Pleasant white male who is resting comfortably but frequently will sleep do to narcotics. Abdomen is soft with nonspecific tenderness throughout the abdomen. I do not appreciate any distention. It was difficult to feel for any hepatosplenomegaly, masses, or hernias due to patient discomfort. He did not have a rigid abdomen to my examination.  Assessment/Plan: Impression: Abdominal pain of unknown etiology. He is not acting like a bowel obstruction secondary to adhesive disease. Contrast has gone through to the colon and even a partial bowel obstruction would not have ongoing abdominal pain with an NG tube in place in the bowel starting to decompress. Bowel compromise was not seen on the CT scan the abdomen and his white blood cell count has  normalized. This still appears to me to be an inflammatory process of the bowel. I did discuss his care with his family and Dr. Laural Golden. Should he not improve over the next 24-36 hours, an exploratory laparotomy may be indicated. He does not need acute surgical invention right now. I would like to see how his course progresses.  Sarkis Rhines A 10/28/2013, 9:22 PM

## 2013-10-28 NOTE — Progress Notes (Signed)
Note: This document was prepared with digital dictation and possible smart phrase technology. Any transcriptional errors that result from this process are unintentional.   [name] [name] TOI:[id number] DOB: Sep 03, 1985 DOA: 10/27/2013 PCP: [name]., MD  Brief narrative: 28 y/o ?, admitted 10/27/13 with nausea vomiting abdominal pain occurring since 10/26/13. States that he had an episode of this about a month ago at [facility] but state 8 hours in the emergency room went home and had no recurrence of nausea vomiting or diarrhea which he had initially. Known prior history of cholecystectomy 8 years ago 8338, and umbilical hernia surgery 2009 both at [facility]  Past medical history-As per Problem list Chart reviewed as  Consultants:  Conferred  with the general surgeon  Gastroenterology consult 3/18  Procedures:  CT scan abdomen and pelvis 3/17  Abdominal x-ray 3/18  Antibiotics:  Ciprofloxacin 3/17  Flagyl 3/17   Subjective  States 10/10 pain, however looks comfortable otherwise. Multiple family members at bedside insistent that he "be transfred for better care" and concerned about this being cancerous in origin as maternal GM had breast, colon, ovarian  NO CP but "cannot breathe' Wife relates has chronic LBP still under work-up and takes Percoet for this only occasionally     Objective    Interim History: None  Telemetry: None   Objective: Filed Vitals:   10/27/13 1924 10/27/13 2010 10/27/13 2201 10/28/13 0500  BP: 102/47 103/39 102/41 99/41  Pulse: 95 81 79 66  Temp: 99 F (37.2 C)  98.1 F (36.7 C) 98.1 F (36.7 C)  TempSrc: Oral Other (Comment)  Oral  Resp:  20 20 15   SpO2: 100% 97% 97% 100%    Intake/Output Summary (Last 24 hours) at 10/28/13 2505 Last data filed at 10/27/13 1944  Gross per 24 hour  Intake   1000 ml  Output      0 ml  Net   1000 ml    Exam:  General: EOMI, NCAT Cardiovascular: s1 s2 no m/r/g Respiratory: clear, no  added sound Abdomen: soft, BS+, Generlized tenderness in Lower quadrants of abdomen Skin no LE edemka Neuro Intact  Data Reviewed: Basic Metabolic Panel:  Recent Labs Lab 10/27/13 1837 10/28/13 0515  NA 137 139  K 4.0 3.4*  CL 97 102  CO2 25 28  GLUCOSE 91 89  BUN 19 14  CREATININE 0.98 0.98  CALCIUM 9.1 7.5*   Liver Function Tests:  Recent Labs Lab 10/28/13 0515  AST 26  ALT 27  ALKPHOS 41  BILITOT 1.6*  PROT 6.2  ALBUMIN 3.1*    Recent Labs Lab 10/27/13 1837  LIPASE 15   No results found for this basename: AMMONIA,  in the last 168 hours CBC:  Recent Labs Lab 10/27/13 1837 10/28/13 0515  WBC 12.8* 6.5  NEUTROABS 11.4*  --   HGB 15.5 13.3  HCT 45.5 39.9  MCV 88.7 89.7  PLT 228 187   Cardiac Enzymes: No results found for this basename: CKTOTAL, CKMB, CKMBINDEX, TROPONINI,  in the last 168 hours BNP: No components found with this basename: POCBNP,  CBG: No results found for this basename: GLUCAP,  in the last 168 hours  No results found for this or any previous visit (from the past 240 hour(s)).   Studies:              All Imaging reviewed and is as per above notation   Scheduled Meds: . sodium chloride   Intravenous STAT  . ciprofloxacin  400  mg Intravenous Q12H  . heparin  5,000 Units Subcutaneous 3 times per day  . LORazepam      . LORazepam  1 mg Intravenous Once  . metronidazole  500 mg Intravenous Q8H   Continuous Infusions: . sodium chloride 125 mL/hr at 10/27/13 1946     Assessment/Plan: 1. Partial SBO-confirmed on abdominal x-ray 3/18. Will need NG tube to decompress abdomen. Have explained this clearly to family. Differential diagnosis equal size potential Crohn's stricture vs. ulcerative colitis causing bimodal diarrhea and nausea vomiting. Will ask gastroenterology for assistance. I have discussed extensively with the general surgeon Dr. Arnoldo Morale patient's films and scans and currently there is no role for surgical intervention  at this stage. I have also clearly explained in detail to family [mother, aunt, wif at bedside that small bowel obstructions] may take 24-96 hours to resolve and if no symptom at resolution, only then would we consider surgical palliation.  I very much appreciate general surgeon input and appreciate in advance gastroenterology input-for now I will continue empiric ciprofloxacin + Flagyl, but without further white count this morning, feel that this may be overkill and may discontinue it tomorrow-labs in a.m. ordered, abdominal x-ray a.m. ordered 2. Chronic low back pain-I mentioned that there is a role for narcotic discuss the R/b/a opiates in terms of slowing down peristalsis -patient clearly understands and I will change his morphine to get out to see if this helps with   Code Status: Full  Family Communication: Discussed in detail at bedside with family , greater than 20 minutes  Disposition Plan: Inpatient   Verneita Griffes, MD  [facility] Pager [phone] 10/28/2013, 9:22 AM    LOS: 1 day

## 2013-10-28 NOTE — Care Management Utilization Note (Signed)
UR completed 

## 2013-10-28 NOTE — Care Management Note (Signed)
    Page 1 of 1   10/28/2013     1:17:30 PM   CARE MANAGEMENT NOTE 10/28/2013  Patient:  [name] [name]   Account Number:  [id number]  Date Initiated:  10/28/2013  Documentation initiated by:  Claretha Cooper  Subjective/Objective Assessment:   Pt admitted from home where he lives with his girlfriend. At DC, plans to return home. No HH/DME needs identified or anticipated.,     Action/Plan:   Anticipated DC Date:     Anticipated DC Plan:  HOME/SELF CARE         Choice offered to / List presented to:             Status of service:  Completed, signed off Medicare Important Message given?   (If response is "NO", the following Medicare IM given date fields will be blank) Date Medicare IM given:   Date Additional Medicare IM given:    Discharge Disposition:    Per UR Regulation:    If discussed at Long Length of Stay Meetings, dates discussed:    Comments:  10/28/13 Claretha Cooper RN BSN CM

## 2013-10-29 ENCOUNTER — Inpatient Hospital Stay (HOSPITAL_COMMUNITY): Payer: BC Managed Care – PPO

## 2013-10-29 LAB — CBC
HEMATOCRIT: 40.1 % (ref 39.0–52.0)
Hemoglobin: 13.3 g/dL (ref 13.0–17.0)
MCH: 30 pg (ref 26.0–34.0)
MCHC: 33.2 g/dL (ref 30.0–36.0)
MCV: 90.5 fL (ref 78.0–100.0)
Platelets: 203 10*3/uL (ref 150–400)
RBC: 4.43 MIL/uL (ref 4.22–5.81)
RDW: 12.7 % (ref 11.5–15.5)
WBC: 7.2 10*3/uL (ref 4.0–10.5)

## 2013-10-29 LAB — BASIC METABOLIC PANEL
BUN: 9 mg/dL (ref 6–23)
CO2: 27 meq/L (ref 19–32)
CREATININE: 1.06 mg/dL (ref 0.50–1.35)
Calcium: 8.3 mg/dL — ABNORMAL LOW (ref 8.4–10.5)
Chloride: 102 mEq/L (ref 96–112)
GFR calc Af Amer: >90 mL/min (ref >90)
GFR calc non Af Amer: >90 mL/min (ref >90)
GLUCOSE: 82 mg/dL (ref 70–99)
Potassium: 3.7 mEq/L (ref 3.7–5.3)
Sodium: 141 mEq/L (ref 137–147)

## 2013-10-29 MED ORDER — DIPHENHYDRAMINE HCL 25 MG PO CAPS
25.0000 mg | ORAL_CAPSULE | Freq: Once | ORAL | Status: AC
  Administered 2013-10-29: 25 mg via ORAL
  Filled 2013-10-29: qty 1

## 2013-10-29 MED ORDER — ACETAMINOPHEN 500 MG PO TABS
500.0000 mg | ORAL_TABLET | Freq: Four times a day (QID) | ORAL | Status: DC | PRN
  Administered 2013-10-29: 500 mg via ORAL
  Filled 2013-10-29: qty 1

## 2013-10-29 MED ORDER — NICOTINE 14 MG/24HR TD PT24
14.0000 mg | MEDICATED_PATCH | Freq: Every day | TRANSDERMAL | Status: DC
  Administered 2013-10-29 – 2013-10-31 (×3): 14 mg via TRANSDERMAL
  Filled 2013-10-29 (×3): qty 1

## 2013-10-29 NOTE — Progress Notes (Signed)
Subjective: Patient slightly more alert this morning. Nursing is recorded a bowel movement, although he does not remember. He complains about his NG tube hurting his throat.  Objective: Vital signs in last 24 hours: Temp:  [97.4 F (36.3 C)-98.1 F (36.7 C)] 97.9 F (36.6 C) (03/19 0611) Pulse Rate:  [70-95] 95 (03/19 0611) Resp:  [20] 20 (03/19 0611) BP: (104-116)/(42-51) 116/42 mmHg (03/19 0611) SpO2:  [93 %-95 %] 93 % (03/19 0611) Weight:  [91.037 kg (200 lb 11.2 oz)] 91.037 kg (200 lb 11.2 oz) (03/18 1057) Last BM Date: 10/27/13  Intake/Output from previous day: 03/18 0701 - 03/19 0700 In: 1612.5 [I.V.:1312.5; IV Piggyback:300] Out: 6644 [Urine:550; Emesis/NG output:975] Intake/Output this shift:    General appearance: cooperative GI: Soft, nondistended. No rigidity noted. While nonspecific tenderness to palpation along the right side of the abdomen.  Lab Results:   Recent Labs  10/28/13 0515 10/29/13 0519  WBC 6.5 7.2  HGB 13.3 13.3  HCT 39.9 40.1  PLT 187 203   BMET  Recent Labs  10/28/13 0515 10/29/13 0519  NA 139 141  K 3.4* 3.7  CL 102 102  CO2 28 27  GLUCOSE 89 82  BUN 14 9  CREATININE 0.98 1.06  CALCIUM 7.5* 8.3*   PT/INR No results found for this basename: LABPROT, INR,  in the last 72 hours  Studies/Results: Ct Abdomen Pelvis W Contrast  10/27/2013   CLINICAL DATA:  Right lower quadrant pain  EXAM: CT ABDOMEN AND PELVIS WITH CONTRAST  TECHNIQUE: Multidetector CT imaging of the abdomen and pelvis was performed using the standard protocol following bolus administration of intravenous contrast.  CONTRAST:  18mL OMNIPAQUE IOHEXOL 300 MG/ML  SOLN  COMPARISON:  CT ABD-PELV W/ CM dated 01/03/2011; CT ABD-PELV W/ CM dated 04/18/2007  FINDINGS: The lung bases are clear.  The liver demonstrates no focal abnormality. There is no intrahepatic or extrahepatic biliary ductal dilatation. The gallbladder is surgically absent. The spleen demonstrates no focal  abnormality. The kidneys, adrenal glands and pancreas are normal. The bladder is unremarkable.  There are air-fluid levels present within the small bowel and colon. There is the mild small bowel dilatation in the mid right abdomen measuring up to 3.4 cm in diameter involving the proximal jejunum. There is a kinked appearance of the small bowel loop which may be secondary to adhesions. There is gastric distension. There is a normal caliber appendix in the right lower quadrant without periappendiceal inflammatory changes. There is no pneumoperitoneum, pneumatosis, or portal venous gas. There is no abdominal or pelvic free fluid. There is no lymphadenopathy.  The abdominal aorta is normal in caliber.  There are no lytic or sclerotic osseous lesions.  IMPRESSION: 1. Air-fluid levels throughout the small bowel and colon with mild small bowel dilatation involving the proximal jejunum in the mid right abdomen measuring up to 3.4 cm with a kinked appearance. The appearance is concerning for a partial small bowel obstruction with possible concomitant ileus versus enterocolitis. .   Electronically Signed   By: Kathreen Devoid   On: 10/27/2013 17:04   Dg Abd 2 Views  10/28/2013   CLINICAL DATA:  Abdominal pain with nausea and vomiting  EXAM: ABDOMEN - 2 VIEW  COMPARISON:  October 27, 2013  FINDINGS: Supine and upright images were obtained. There remains multiple loops of mildly dilated small bowel with scattered air-fluid levels. Contrast does reach the colon and is largely within the colon currently. There is no free air. Liver is prominent. There  are surgical clips in the gallbladder fossa region.  IMPRESSION: Bowel gas pattern remains concerning for a degree of bowel obstruction. Contrast reaches the colon, indicating that bowel obstruction is partial as opposed to complete. Liver is prominent. No free air.   Electronically Signed   By: Lowella Grip M.D.   On: 10/28/2013 08:40   Dg Abd Acute W/chest  10/29/2013    CLINICAL DATA:  Followup small bowel obstruction  EXAM: ACUTE ABDOMEN SERIES (ABDOMEN 2 VIEW & CHEST 1 VIEW)  COMPARISON:  10/27/2013 and 10/28/2013  FINDINGS: NG tube in place with tip in the region of distal stomach or proximal duodenum. Cardiomediastinal silhouette is unremarkable. No acute infiltrate or pulmonary edema. Contrast material noted throughout the colon. Persistent mild gaseous distended small bowel loops mid abdomen suspicious for ileus or partial small bowel obstruction.  IMPRESSION: NG tube in place. Contrast material throughout the colon. Postcholecystectomy surgical clips. Persistent gaseous distended small bowel loops mid abdomen suspicious for ileus or partial bowel obstruction.   Electronically Signed   By: Lahoma Crocker M.D.   On: 10/29/2013 08:26    Anti-infectives: Anti-infectives   Start     Dose/Rate Route Frequency Ordered Stop   10/27/13 2230  metroNIDAZOLE (FLAGYL) IVPB 500 mg     500 mg 100 mL/hr over 60 Minutes Intravenous Every 8 hours 10/27/13 2157     10/27/13 2130  ciprofloxacin (CIPRO) IVPB 400 mg     400 mg 200 mL/hr over 60 Minutes Intravenous Every 12 hours 10/27/13 2128     10/27/13 2130  metroNIDAZOLE (FLAGYL) IVPB 500 mg  Status:  Discontinued     500 mg 100 mL/hr over 60 Minutes Intravenous Every 8 hours 10/27/13 2128 10/27/13 2157      Assessment/Plan: Impression: Abdominal pain of unknown etiology, slightly improved. Plan: Will remove NG tube and see how patient progresses. We'll reevaluate in next 24 hours as to whether he needs exploratory laparotomy.  LOS: 2 days    Lavante Toso A 10/29/2013

## 2013-10-29 NOTE — Progress Notes (Signed)
Pt ambulated in hallway with standby assist from wife. Pt ambulated approximately 100 feet. Tolerated well. Will continue to monitor and encourage ambulation.

## 2013-10-29 NOTE — Progress Notes (Signed)
Pt ambulated in hallway with standby assist from wife. Pt ambulated approximately 250 feet. Tolerated well. No complaints at this time.

## 2013-10-29 NOTE — Plan of Care (Signed)
Problem: Phase II Progression Outcomes Goal: Discharge plan established Outcome: Completed/Met Date Met:  10/29/13 D/C home at discharge with spouse.

## 2013-10-29 NOTE — Progress Notes (Signed)
Subjective; Patient feels better. He is tolerating sips of liquids. He has passed flatus multiple times. He also had BM this morning. He continues to complain of pain which is centered in the right upper quadrant. He states pain is not as intense as it was yesterday but it still hurts when he takes a deep breath or coughs. NG tube was removed earlier today.  Objective; BP 102/57  Pulse 83  Temp(Src) 98.3 F (36.8 C) (Oral)  Resp 20  Ht 5\' 10"  (1.778 m)  Wt 200 lb 11.2 oz (91.037 kg)  BMI 28.80 kg/m2  SpO2 93% Patient is alert and does not appear to be in acute distress like yesterday. Abdominal exam reveals 2 areas of ecchymosis one in each iliac fossa. Bowel sounds are normal. Abdomen is soft with generalized tenderness which is more pronounced in the right mid abdomen with guarding. No organomegaly or masses. No LE edema noted.  Lab data; WBC 7.2, H&H 13.3 and 40.1 and platelet count 203K. Serum sodium 141, potassium 3.7, protein 102, CO2 27, BUN 9, creatinine 1.06, glucose 82 and calcium 8.3. Acute abdominal series revealed contrast in the colon and prominent loops of proximal small bowel; he also appears to have left colonic diverticula.  Assessment; Abdominal pain felt to be secondary to partial small bowel obstruction. Sensation not typical for acute enteritis. He is on empiric antibiotic therapy and appears to be feeling better although he is still requiring pain medication frequently. He is also being followed by Dr. Aviva Signs of surgical service.  Recommendations; Repeat acute abdominal series in a.m.

## 2013-10-29 NOTE — Progress Notes (Signed)
Pt states [name] [name] does not want Dr. Arnoldo Morale to come back to see him or come to his room any more. Also states if [name] [name] does have to have surgery [name] [name] wants Dr. Romona Curls to perform it. William Hamburger RN

## 2013-10-29 NOTE — Plan of Care (Signed)
Problem: Phase II Progression Outcomes Goal: Progress activity as tolerated unless otherwise ordered Outcome: Completed/Met Date Met:  10/29/13 Pt ambulating in hallway with spouse multiple times this morning.

## 2013-10-29 NOTE — Progress Notes (Signed)
Pt ambulated in hallway, independently. Pt ambulated approximately 200 feet. No complaints.

## 2013-10-29 NOTE — Progress Notes (Signed)
Note: This document was prepared with digital dictation and possible smart phrase technology. Any transcriptional errors that result from this process are unintentional.   Hutch Rhett ZDG:[id number] DOB: 05/01/1986 DOA: 10/27/2013 PCP: [name]., MD  Brief narrative: 28 y/o ?, admitted 10/27/13 with nausea vomiting abdominal pain occurring since 10/26/13. States that he had an episode of this about a month ago at [facility] but state 8 hours in the emergency room went home and had no recurrence of nausea vomiting or diarrhea which he had initially. Known prior history of cholecystectomy 8 years ago 9518, and umbilical hernia surgery 2009 both at [facility]  Past medical history-As per Problem list Chart reviewed as  Consultants:  Surgery 3/18  Gastroenterology consult 3/18  Procedures:  CT scan abdomen and pelvis 3/17  Abdominal x-ray 3/18  Antibiotics:  Ciprofloxacin 3/17  Flagyl 3/17   Subjective   Doing fair. Last flatus. Ambulance No stool Tolerating sips and clears General surgery discontinued NG tube Patient feels better subsequently   Objective    Interim History: None  Telemetry: None   Objective: Filed Vitals:   10/28/13 1427 10/28/13 2036 10/29/13 0611 10/29/13 1441  BP: 105/45 104/51 116/42 102/57  Pulse: 70 93 95 83  Temp: 97.4 F (36.3 C) 98.1 F (36.7 C) 97.9 F (36.6 C) 98.3 F (36.8 C)  TempSrc:  Oral Oral Oral  Resp: 20 20 20 20   Height:      Weight:      SpO2: 95% 93% 93% 93%    Intake/Output Summary (Last 24 hours) at 10/29/13 1627 Last data filed at 10/29/13 1300  Gross per 24 hour  Intake 1752.5 ml  Output    975 ml  Net  777.5 ml    Exam:  General: EOMI, NCAT Cardiovascular: s1 s2 no m/r/g Respiratory: clear, no added sound Abdomen: soft, BS+, Generlized tenderness in Lower quadrants of abdomen Skin no LE edemka Neuro Intact  Data Reviewed: Basic Metabolic Panel:  Recent Labs Lab 10/27/13 1837  10/28/13 0515 10/29/13 0519  NA 137 139 141  K 4.0 3.4* 3.7  CL 97 102 102  CO2 25 28 27   GLUCOSE 91 89 82  BUN 19 14 9   CREATININE 0.98 0.98 1.06  CALCIUM 9.1 7.5* 8.3*   Liver Function Tests:  Recent Labs Lab 10/28/13 0515  AST 26  ALT 27  ALKPHOS 41  BILITOT 1.6*  PROT 6.2  ALBUMIN 3.1*    Recent Labs Lab 10/27/13 1837  LIPASE 15   No results found for this basename: AMMONIA,  in the last 168 hours CBC:  Recent Labs Lab 10/27/13 1837 10/28/13 0515 10/29/13 0519  WBC 12.8* 6.5 7.2  NEUTROABS 11.4*  --   --   HGB 15.5 13.3 13.3  HCT 45.5 39.9 40.1  MCV 88.7 89.7 90.5  PLT 228 187 203   Cardiac Enzymes: No results found for this basename: CKTOTAL, CKMB, CKMBINDEX, TROPONINI,  in the last 168 hours BNP: No components found with this basename: POCBNP,  CBG: No results found for this basename: GLUCAP,  in the last 168 hours  Recent Results (from the past 240 hour(s))  STOOL CULTURE     Status: None   Collection Time    10/27/13 10:25 PM      Result Value Ref Range Status   Specimen Description STOOL   Final   Special Requests NONE   Final   Culture     Final   Value: Culture reincubated for better  growth     Performed at [facility]   Report Status PENDING   Incomplete     Studies:              All Imaging reviewed and is as per above notation   Scheduled Meds: . ciprofloxacin  400 mg Intravenous Q12H  . heparin  5,000 Units Subcutaneous 3 times per day  . LORazepam  1 mg Intravenous Once  . metronidazole  500 mg Intravenous Q8H  . pantoprazole (PROTONIX) IV  40 mg Intravenous Q24H   Continuous Infusions: . sodium chloride 125 mL/hr at 10/29/13 0554     Assessment/Plan: 1. Partial SBO-confirmed on abdominal x-ray 3/18. Appreciate general surgery/gastroenterology input -hopefully this will improve without need for surgical intervention. continue empiric ciprofloxacin + Flagyl, overnight 2. Chronic low back pain-I mentioned that  there is a role for narcotic discuss the R/b/a opiates in terms of slowing down peristalsis -patient clearly understands and I will change his morphine to get out to see if this helps with   Code Status: Full  Family Communication: Discussed in detail at bedside with family Disposition Plan: Inpatient  15 minutes  Verneita Griffes, MD  [facility] Pager [phone] 10/29/2013, 4:27 PM    LOS: 2 days

## 2013-10-29 NOTE — Progress Notes (Signed)
Dr. Arnoldo Morale called me asking what the problem was and why the patient requested not to have him back on his case, I explained i was told it was due to him being too rough removing his NG tube. I then went in the room and asked the patient to clarify his issue with Dr. Arnoldo Morale and the patient stated last night when Dr. Arnoldo Morale came to see him he had said something's that upset his mother but could not remember what was said. This morning he said Dr. Arnoldo Morale was just irritating and sarcastic when asking how he felt and if he wanted his NG tube out and that he was rough and did not even let the patient know when he was pulling it out.These are all the the things the patient said he felt and his reason he would prefer Dr. Romona Curls if surgery has to take place. Elmo Putt RN

## 2013-10-30 ENCOUNTER — Inpatient Hospital Stay (HOSPITAL_COMMUNITY): Payer: BC Managed Care – PPO

## 2013-10-30 LAB — COMPREHENSIVE METABOLIC PANEL
ALT: 25 U/L (ref 0–53)
AST: 19 U/L (ref 0–37)
Albumin: 3.1 g/dL — ABNORMAL LOW (ref 3.5–5.2)
Alkaline Phosphatase: 40 U/L (ref 39–117)
BUN: 3 mg/dL — ABNORMAL LOW (ref 6–23)
CALCIUM: 8.2 mg/dL — AB (ref 8.4–10.5)
CO2: 30 meq/L (ref 19–32)
CREATININE: 0.83 mg/dL (ref 0.50–1.35)
Chloride: 106 mEq/L (ref 96–112)
GFR calc Af Amer: >90 mL/min (ref >90)
Glucose, Bld: 106 mg/dL — ABNORMAL HIGH (ref 70–99)
Potassium: 3.8 mEq/L (ref 3.7–5.3)
Sodium: 143 mEq/L (ref 137–147)
Total Bilirubin: 0.6 mg/dL (ref 0.3–1.2)
Total Protein: 6.2 g/dL (ref 6.0–8.3)

## 2013-10-30 LAB — CBC
HCT: 37.3 % — ABNORMAL LOW (ref 39.0–52.0)
Hemoglobin: 12.6 g/dL — ABNORMAL LOW (ref 13.0–17.0)
MCH: 30.1 pg (ref 26.0–34.0)
MCHC: 33.8 g/dL (ref 30.0–36.0)
MCV: 89 fL (ref 78.0–100.0)
Platelets: 203 10*3/uL (ref 150–400)
RBC: 4.19 MIL/uL — AB (ref 4.22–5.81)
RDW: 12.6 % (ref 11.5–15.5)
WBC: 4.5 10*3/uL (ref 4.0–10.5)

## 2013-10-30 LAB — DIFFERENTIAL
BASOS ABS: 0 10*3/uL (ref 0.0–0.1)
BASOS PCT: 0 % (ref 0–1)
EOS ABS: 0.1 10*3/uL (ref 0.0–0.7)
EOS PCT: 2 % (ref 0–5)
LYMPHS ABS: 0.9 10*3/uL (ref 0.7–4.0)
Lymphocytes Relative: 23 % (ref 12–46)
Monocytes Absolute: 0.5 10*3/uL (ref 0.1–1.0)
Monocytes Relative: 12 % (ref 3–12)
Neutro Abs: 2.6 10*3/uL (ref 1.7–7.7)
Neutrophils Relative %: 64 % (ref 43–77)

## 2013-10-30 MED ORDER — PANTOPRAZOLE SODIUM 40 MG PO TBEC
40.0000 mg | DELAYED_RELEASE_TABLET | Freq: Every day | ORAL | Status: DC
  Administered 2013-10-30 – 2013-10-31 (×2): 40 mg via ORAL
  Filled 2013-10-30 (×2): qty 1

## 2013-10-30 MED ORDER — HYDROMORPHONE HCL PF 1 MG/ML IJ SOLN
1.0000 mg | INTRAMUSCULAR | Status: DC | PRN
  Administered 2013-10-30: 1 mg via INTRAVENOUS
  Filled 2013-10-30: qty 1

## 2013-10-30 MED ORDER — AMOXICILLIN-POT CLAVULANATE 875-125 MG PO TABS
1.0000 | ORAL_TABLET | Freq: Two times a day (BID) | ORAL | Status: DC
  Administered 2013-10-30 – 2013-10-31 (×2): 1 via ORAL
  Filled 2013-10-30 (×2): qty 1

## 2013-10-30 MED ORDER — ESCITALOPRAM OXALATE 10 MG PO TABS
20.0000 mg | ORAL_TABLET | Freq: Every day | ORAL | Status: DC
  Administered 2013-10-30 – 2013-10-31 (×2): 20 mg via ORAL
  Filled 2013-10-30 (×2): qty 2

## 2013-10-30 MED ORDER — HYDROCODONE-ACETAMINOPHEN 10-325 MG PO TABS
1.0000 | ORAL_TABLET | Freq: Four times a day (QID) | ORAL | Status: DC | PRN
  Administered 2013-10-30: 1 via ORAL
  Filled 2013-10-30: qty 1

## 2013-10-30 NOTE — Progress Notes (Signed)
Subjective; Patient states he feels better. He continues to complain of constant pain but it has decreased in intensity. He he states he still needs pain medication which he is taking every 2-3 hours. He is passing flatus and his bowels have moved. He is not nauseated and is hungry. His diet has been advanced. He is waiting for his lunch.  Objective; BP 121/58  Pulse 72  Temp(Src) 98.2 F (36.8 C) (Oral)  Resp 20  Ht 5\' 10"  (1.778 m)  Wt 200 lb 11.2 oz (91.037 kg)  BMI 28.80 kg/m2  SpO2 94% Patient appears comfortable. Abdomen is full with normal bowel sounds. It is soft with mild generalized tenderness but no guarding noted today.  Lab data; WBC 4.5, H&H 12.6 and 37.3 platelet count 203K. Comprehensive chemistry panel is within normal limits except serum albumin of 3.1. Acute abdominal series reviewed. Some contrast is still present in the colon but small bowel is nondilated. Stool culture reveals no pathogens so far.  Assessment; Abdominal pain has improved with resolution of nausea and vomiting. Etiology felt to be partial small bowel obstruction or enteritis with atypical presentation. He remains on empiric IV antibiotic therapy. If he tolerates diet he will be switched to oral antibiotics which should be continued for a total of 7 days.  Recommendations; Change pantoprazole to oral route. Change antibiotics to oral route later today or in a.m. Will ask lab to do differential on CBC from this morning. Patient will need small bowel 4 through at a later date when he has fully recovered.

## 2013-10-30 NOTE — Progress Notes (Signed)
Note: This document was prepared with digital dictation and possible smart phrase technology. Any transcriptional errors that result from this process are unintentional.   [name] [name] PFX:[id number] DOB: 03/06/1986 DOA: 10/27/2013 PCP: [name]., MD  Brief narrative: 28 y/o ?, admitted 10/27/13 with nausea vomiting abdominal pain occurring since 10/26/13. States that he had an episode of this about a month ago at [facility] but state 8 hours in the emergency room went home and had no recurrence of nausea vomiting or diarrhea which he had initially. Known prior history of cholecystectomy 8 years ago 3299, and umbilical hernia surgery 2009 both at [facility] General surgery as well as gastroenterology were consulted to see the patient and patient did start passing flatus on 3/18 His diet was cautiously graduated 3/20 given resolution of symptoms on KUB DG acute abdominal series and we await full resolution of her symptoms  Past medical history-As per Problem list Chart reviewed as  Consultants:  Surgery 3/18  Gastroenterology consult 3/18  Procedures:  CT scan abdomen and pelvis 3/17  Abdominal x-ray 3/18  Antibiotics:  Ciprofloxacin 3/17  Flagyl 3/17   Subjective   Doing fair. Passing flatus No stool Tolerating sips and clears, chewing gum Encouraged to ambulate   Objective    Interim History: None  Telemetry: None   Objective: Filed Vitals:   10/29/13 0611 10/29/13 1441 10/29/13 2027 10/30/13 0417  BP: 116/42 102/57 117/47 121/58  Pulse: 95 83 69 72  Temp: 97.9 F (36.6 C) 98.3 F (36.8 C) 98.1 F (36.7 C) 98.2 F (36.8 C)  TempSrc: Oral Oral Axillary Oral  Resp: 20 20 20 20   Height:      Weight:      SpO2: 93% 93% 97% 94%    Intake/Output Summary (Last 24 hours) at 10/30/13 0959 Last data filed at 10/29/13 2029  Gross per 24 hour  Intake   2070 ml  Output      0 ml  Net   2070 ml    Exam:  General: EOMI, NCAT Cardiovascular: s1  s2 no m/r/g Respiratory: clear, no added sound Abdomen: soft, BS+, Generlized tenderness in Lower quadrants of abdomen Skin no LE edemka Neuro Intact  Data Reviewed: Basic Metabolic Panel:  Recent Labs Lab 10/27/13 1837 10/28/13 0515 10/29/13 0519 10/30/13 0528  NA 137 139 141 143  K 4.0 3.4* 3.7 3.8  CL 97 102 102 106  CO2 25 28 27 30   GLUCOSE 91 89 82 106*  BUN 19 14 9  3*  CREATININE 0.98 0.98 1.06 0.83  CALCIUM 9.1 7.5* 8.3* 8.2*   Liver Function Tests:  Recent Labs Lab 10/28/13 0515 10/30/13 0528  AST 26 19  ALT 27 25  ALKPHOS 41 40  BILITOT 1.6* 0.6  PROT 6.2 6.2  ALBUMIN 3.1* 3.1*    Recent Labs Lab 10/27/13 1837  LIPASE 15   No results found for this basename: AMMONIA,  in the last 168 hours CBC:  Recent Labs Lab 10/27/13 1837 10/28/13 0515 10/29/13 0519 10/30/13 0528  WBC 12.8* 6.5 7.2 4.5  NEUTROABS 11.4*  --   --   --   HGB 15.5 13.3 13.3 12.6*  HCT 45.5 39.9 40.1 37.3*  MCV 88.7 89.7 90.5 89.0  PLT 228 187 203 203   Cardiac Enzymes: No results found for this basename: CKTOTAL, CKMB, CKMBINDEX, TROPONINI,  in the last 168 hours BNP: No components found with this basename: POCBNP,  CBG: No results found for this basename: GLUCAP,  in  the last 168 hours  Recent Results (from the past 240 hour(s))  STOOL CULTURE     Status: None   Collection Time    10/27/13 10:25 PM      Result Value Ref Range Status   Specimen Description STOOL   Final   Special Requests NONE   Final   Culture     Final   Value: NO SUSPICIOUS COLONIES, CONTINUING TO HOLD     Note: REDUCED NORMAL FLORA PRESENT     Performed at [facility]   Report Status PENDING   Incomplete     Studies:              All Imaging reviewed and is as per above notation   Scheduled Meds: . ciprofloxacin  400 mg Intravenous Q12H  . heparin  5,000 Units Subcutaneous 3 times per day  . LORazepam  1 mg Intravenous Once  . metronidazole  500 mg Intravenous Q8H  . nicotine   14 mg Transdermal Daily  . pantoprazole (PROTONIX) IV  40 mg Intravenous Q24H   Continuous Infusions: . sodium chloride 125 mL/hr at 10/30/13 0414     Assessment/Plan: 1. Partial SBO-confirmed on abdominal x-ray 3/18. Appreciate general surgery/gastroenterology input -hopefully this will improve without need for surgical intervention. Discontinue empiric GI coverage for now given no fever no chills no other symptoms or signs. Graduated to full liquid diet and now subsequently healed may benefit from regular diet if he tolerates that. I have cut back his IV fluids to 50 cc an hour to promote diet 2. Chronic low back pain-I mentioned that there is a role for narcotic discuss the R/b/a opiates in terms of slowing down peristalsis -patient clearly understands my discussion this morning with them regarding scaling back his Dilantin to 3 hourly to see this promote peristalsis. He is encouraged to ambulate  Code Status: Full  Family Communication: Discussed in detail at bedside with family Disposition Plan: Inpatient  15 minutes  Verneita Griffes, MD  [facility] Pager [phone] 10/30/2013, 9:59 AM    LOS: 3 days

## 2013-10-30 NOTE — Progress Notes (Signed)
Dr. Verlon Au was notified of the patients request per night nurse report that he did not want Dr. Arnoldo Morale to be his attending surgeon/MD.  He request bradford for his services.  MD stated that he was aware of the night note documented and he would notify Dr. Romona Curls.  He also stated that he would only need the abdominal xray.

## 2013-10-30 NOTE — Progress Notes (Signed)
Diet was increased to mechanical soft diet due to he stated that he was able to tolerate the clears and was ready for actual food.  He had no c/o n/v or abdominal pain that was voiced after the clear liquid tray.

## 2013-10-30 NOTE — Progress Notes (Signed)
Notified Dr. Romona Curls of the consult.  He was currently in surgery.

## 2013-10-31 MED ORDER — AMOXICILLIN-POT CLAVULANATE 875-125 MG PO TABS: 1.0000 | ORAL_TABLET | Freq: Two times a day (BID) | ORAL | Status: DC

## 2013-10-31 MED ORDER — NICOTINE 14 MG/24HR TD PT24: 14.0000 mg | MEDICATED_PATCH | Freq: Every day | TRANSDERMAL | Status: DC

## 2013-10-31 MED ORDER — HYDROCODONE-ACETAMINOPHEN 10-325 MG PO TABS: 1.0000 | ORAL_TABLET | Freq: Four times a day (QID) | ORAL | Status: DC | PRN

## 2013-10-31 NOTE — Discharge Summary (Signed)
Physician Discharge Summary  [name] [name] WEX:[id number] DOB: [birth year] DOA: 10/27/2013  PCP: [name]., MD  Admit date: 10/27/2013 Discharge date: 10/31/2013  Time spent: 30 minutes  Recommendations for Outpatient Follow-up:  1. Please follow up with gastroenterology as has been recommended  2. Very limited amount of pain medicine prescribed-this will need followup with his primary care physician   Discharge Diagnoses:  Active Problems:   Partial small bowel obstruction   Abdominal pain   Discharge Condition: Good  Diet recommendation: Regular  Filed Weights   10/28/13 1057 10/31/13 0549  Weight: 91.037 kg (200 lb 11.2 oz) 89.812 kg (198 lb)    History of present illness:  28 y/o ?, admitted 10/27/13 with nausea vomiting abdominal pain occurring since 10/26/13. States that he had an episode of this about a month ago at [facility] but state 8 hours in the emergency room went home and had no recurrence of nausea vomiting or diarrhea which he had initially.  Known prior history of cholecystectomy 8 years ago 9381, and umbilical hernia surgery 2009 both at [facility].  General surgery as well as gastroenterology were consulted to see the patient and patient did start passing flatus on 3/18  His diet was cautiously graduated 3/20 given resolution of symptoms on KUB DG acute abdominal series and we await full resolution of her symptoms   Hospital Course:   1. Partial SBO-confirmed on abdominal x-ray 3/18. Appreciate general surgery/gastroenterology input .  Patient was slow to resolve but on discharge was tolerating full diet-opinion of GI was H&E need further workup in terms of why this occurred and outpatient appointment will be scheduled through Dr. Olevia Perches office. It was recommended that he continue a short course of X. for empiric coverage as he might have had an colitis as per GI and we will continue Augmentin until 3/26 [previously on Cipro Flagyl] 2. Chronic low back  pain-I mentioned that there is limited role for narcotic discuss the R/b/a opiates in terms of slowing down peristalsis -patient clearly understands my discussion with him.Marland Kitchen He was prescribed only limited amount of opiates on discharge and will need them prescribed by his primary care physician as an outpatient 3. Tobacco use-mentioned to quit-prescribed a patch on discharge 4. Possible bipolar-continue Celexa as an outpatient   Surgery 3/18  Gastroenterology consult 3/18 Procedures:  CT scan abdomen and pelvis 3/17  Abdominal x-ray 3/18 Antibiotics:  Ciprofloxacin 3/17 -3/20 Flagyl 3/17-3/20 Augmentin 3/21-3/26 stop date  Discharge Exam: Filed Vitals:   10/31/13 0549  BP: 106/50  Pulse: 52  Temp: 97.2 F (36.2 C)  Resp: 20    alert pleasant oriented  General:  EOMI, NCAT  Cardiovascular:  S1-S2 no murmur gallop  Respiratory:  clinically clear   Discharge Instructions  Discharge Orders   Future Orders Complete By Expires   Diet - low sodium heart healthy  As directed    Discharge instructions  As directed    Comments:     Follow-up c Dr. Laural Golden as an OP.  He might want to do some tests on you. Make sure you finish the Antibiotics given to you please Continue pain management with your regular MD-small amounts of this have been Rx to you   Increase activity slowly  As directed        Medication List         amoxicillin-clavulanate 875-125 MG per tablet  Commonly known as:  AUGMENTIN  Take 1 tablet by mouth every 12 (twelve) hours.  escitalopram 20 MG tablet  Commonly known as:  LEXAPRO  Take 20 mg by mouth daily.     HYDROcodone-acetaminophen 10-325 MG per tablet  Commonly known as:  NORCO  Take 1 tablet by mouth every 6 (six) hours as needed for moderate pain or severe pain.     nicotine 14 mg/24hr patch  Commonly known as:  NICODERM CQ - dosed in mg/24 hours  Place 1 patch (14 mg total) onto the skin daily.       No Known Allergies    The results  of significant diagnostics from this hospitalization (including imaging, microbiology, ancillary and laboratory) are listed below for reference.    Significant Diagnostic Studies: Ct Abdomen Pelvis W Contrast  10/27/2013   CLINICAL DATA:  Right lower quadrant pain  EXAM: CT ABDOMEN AND PELVIS WITH CONTRAST  TECHNIQUE: Multidetector CT imaging of the abdomen and pelvis was performed using the standard protocol following bolus administration of intravenous contrast.  CONTRAST:  150mL OMNIPAQUE IOHEXOL 300 MG/ML  SOLN  COMPARISON:  CT ABD-PELV W/ CM dated 01/03/2011; CT ABD-PELV W/ CM dated 04/18/2007  FINDINGS: The lung bases are clear.  The liver demonstrates no focal abnormality. There is no intrahepatic or extrahepatic biliary ductal dilatation. The gallbladder is surgically absent. The spleen demonstrates no focal abnormality. The kidneys, adrenal glands and pancreas are normal. The bladder is unremarkable.  There are air-fluid levels present within the small bowel and colon. There is the mild small bowel dilatation in the mid right abdomen measuring up to 3.4 cm in diameter involving the proximal jejunum. There is a kinked appearance of the small bowel loop which may be secondary to adhesions. There is gastric distension. There is a normal caliber appendix in the right lower quadrant without periappendiceal inflammatory changes. There is no pneumoperitoneum, pneumatosis, or portal venous gas. There is no abdominal or pelvic free fluid. There is no lymphadenopathy.  The abdominal aorta is normal in caliber.  There are no lytic or sclerotic osseous lesions.  IMPRESSION: 1. Air-fluid levels throughout the small bowel and colon with mild small bowel dilatation involving the proximal jejunum in the mid right abdomen measuring up to 3.4 cm with a kinked appearance. The appearance is concerning for a partial small bowel obstruction with possible concomitant ileus versus enterocolitis. .   Electronically Signed   By:  Kathreen Devoid   On: 10/27/2013 17:04   Dg Abd 2 Views  10/30/2013   CLINICAL DATA:  Abdominal pain  EXAM: ABDOMEN - 2 VIEW  COMPARISON:  10/29/2013  FINDINGS: A nasogastric catheter is been removed. Contrast material again is within the colon from prior CT examination. No obstructive changes are seen. No free air is noted. No abnormal mass is seen. Postsurgical changes are noted.  IMPRESSION: No acute abnormality seen.   Electronically Signed   By: Inez Catalina M.D.   On: 10/30/2013 08:32   Dg Abd 2 Views  10/28/2013   CLINICAL DATA:  Abdominal pain with nausea and vomiting  EXAM: ABDOMEN - 2 VIEW  COMPARISON:  October 27, 2013  FINDINGS: Supine and upright images were obtained. There remains multiple loops of mildly dilated small bowel with scattered air-fluid levels. Contrast does reach the colon and is largely within the colon currently. There is no free air. Liver is prominent. There are surgical clips in the gallbladder fossa region.  IMPRESSION: Bowel gas pattern remains concerning for a degree of bowel obstruction. Contrast reaches the colon, indicating that bowel obstruction is partial  as opposed to complete. Liver is prominent. No free air.   Electronically Signed   By: Lowella Grip M.D.   On: 10/28/2013 08:40   Dg Abd Acute W/chest  10/29/2013   CLINICAL DATA:  Followup small bowel obstruction  EXAM: ACUTE ABDOMEN SERIES (ABDOMEN 2 VIEW & CHEST 1 VIEW)  COMPARISON:  10/27/2013 and 10/28/2013  FINDINGS: NG tube in place with tip in the region of distal stomach or proximal duodenum. Cardiomediastinal silhouette is unremarkable. No acute infiltrate or pulmonary edema. Contrast material noted throughout the colon. Persistent mild gaseous distended small bowel loops mid abdomen suspicious for ileus or partial small bowel obstruction.  IMPRESSION: NG tube in place. Contrast material throughout the colon. Postcholecystectomy surgical clips. Persistent gaseous distended small bowel loops mid abdomen  suspicious for ileus or partial bowel obstruction.   Electronically Signed   By: Lahoma Crocker M.D.   On: 10/29/2013 08:26    Microbiology: Recent Results (from the past 240 hour(s))  STOOL CULTURE     Status: None   Collection Time    10/27/13 10:25 PM      Result Value Ref Range Status   Specimen Description STOOL   Final   Special Requests NONE   Final   Culture     Final   Value: NO SUSPICIOUS COLONIES, CONTINUING TO HOLD     Note: REDUCED NORMAL FLORA PRESENT     Performed at [facility]   Report Status PENDING   Incomplete     Labs: Basic Metabolic Panel:  Recent Labs Lab 10/27/13 1837 10/28/13 0515 10/29/13 0519 10/30/13 0528  NA 137 139 141 143  K 4.0 3.4* 3.7 3.8  CL 97 102 102 106  CO2 25 28 27 30   GLUCOSE 91 89 82 106*  BUN 19 14 9  3*  CREATININE 0.98 0.98 1.06 0.83  CALCIUM 9.1 7.5* 8.3* 8.2*   Liver Function Tests:  Recent Labs Lab 10/28/13 0515 10/30/13 0528  AST 26 19  ALT 27 25  ALKPHOS 41 40  BILITOT 1.6* 0.6  PROT 6.2 6.2  ALBUMIN 3.1* 3.1*    Recent Labs Lab 10/27/13 1837  LIPASE 15   No results found for this basename: AMMONIA,  in the last 168 hours CBC:  Recent Labs Lab 10/27/13 1837 10/28/13 0515 10/29/13 0519 10/30/13 0528  WBC 12.8* 6.5 7.2 4.5  NEUTROABS 11.4*  --   --  2.6  HGB 15.5 13.3 13.3 12.6*  HCT 45.5 39.9 40.1 37.3*  MCV 88.7 89.7 90.5 89.0  PLT 228 187 203 203   Cardiac Enzymes: No results found for this basename: CKTOTAL, CKMB, CKMBINDEX, TROPONINI,  in the last 168 hours BNP: BNP (last 3 results) No results found for this basename: PROBNP,  in the last 8760 hours CBG: No results found for this basename: GLUCAP,  in the last 168 hours     Signed:  Nita Sells  [facility] 10/31/2013, 10:31 AM

## 2013-10-31 NOTE — Progress Notes (Signed)
Discharge instructions and prescriptions given, verbalized understanding, out in stable condition ambulatory with staff. 

## 2013-11-01 LAB — STOOL CULTURE

## 2013-11-02 ENCOUNTER — Ambulatory Visit (HOSPITAL_COMMUNITY)
Admission: RE | Admit: 2013-11-02 | Discharge: 2013-11-02 | Disposition: A | Discharge status: Home / Self Care | Payer: BC Managed Care – PPO | Source: Ambulatory Visit | Attending: Family Medicine | Admitting: Family Medicine

## 2013-11-02 ENCOUNTER — Other Ambulatory Visit (HOSPITAL_COMMUNITY): Payer: Self-pay | Admitting: Family Medicine

## 2013-11-02 DIAGNOSIS — R109 Unspecified abdominal pain: Secondary | ICD-10-CM

## 2013-11-02 NOTE — Progress Notes (Signed)
UR chart review completed.  

## 2013-11-05 ENCOUNTER — Ambulatory Visit (INDEPENDENT_AMBULATORY_CARE_PROVIDER_SITE_OTHER): Payer: BC Managed Care – PPO | Admitting: Internal Medicine

## 2014-02-16 ENCOUNTER — Other Ambulatory Visit (HOSPITAL_COMMUNITY): Payer: Self-pay | Admitting: Family Medicine

## 2014-02-16 DIAGNOSIS — M25551 Pain in right hip: Secondary | ICD-10-CM

## 2015-02-06 ENCOUNTER — Inpatient Hospital Stay (HOSPITAL_COMMUNITY)
Admission: EM | Admit: 2015-02-06 | Discharge: 2015-02-09 | DRG: 390 | Disposition: A | Discharge status: Home / Self Care | Payer: 59 | Attending: Internal Medicine | Admitting: Internal Medicine

## 2015-02-06 ENCOUNTER — Emergency Department (HOSPITAL_COMMUNITY): Payer: 59

## 2015-02-06 ENCOUNTER — Encounter (HOSPITAL_COMMUNITY): Payer: Self-pay

## 2015-02-06 DIAGNOSIS — M545 Low back pain: Secondary | ICD-10-CM | POA: Diagnosis not present

## 2015-02-06 DIAGNOSIS — K56600 Partial intestinal obstruction, unspecified as to cause: Secondary | ICD-10-CM

## 2015-02-06 DIAGNOSIS — G8929 Other chronic pain: Secondary | ICD-10-CM | POA: Diagnosis present

## 2015-02-06 DIAGNOSIS — R109 Unspecified abdominal pain: Secondary | ICD-10-CM | POA: Diagnosis present

## 2015-02-06 DIAGNOSIS — K5669 Other intestinal obstruction: Secondary | ICD-10-CM | POA: Diagnosis not present

## 2015-02-06 DIAGNOSIS — R112 Nausea with vomiting, unspecified: Secondary | ICD-10-CM

## 2015-02-06 DIAGNOSIS — F17210 Nicotine dependence, cigarettes, uncomplicated: Secondary | ICD-10-CM | POA: Diagnosis present

## 2015-02-06 DIAGNOSIS — R197 Diarrhea, unspecified: Secondary | ICD-10-CM | POA: Diagnosis not present

## 2015-02-06 DIAGNOSIS — Z23 Encounter for immunization: Secondary | ICD-10-CM | POA: Diagnosis not present

## 2015-02-06 DIAGNOSIS — R1084 Generalized abdominal pain: Secondary | ICD-10-CM | POA: Diagnosis not present

## 2015-02-06 DIAGNOSIS — E860 Dehydration: Secondary | ICD-10-CM | POA: Diagnosis present

## 2015-02-06 DIAGNOSIS — K56609 Unspecified intestinal obstruction, unspecified as to partial versus complete obstruction: Secondary | ICD-10-CM

## 2015-02-06 DIAGNOSIS — I959 Hypotension, unspecified: Secondary | ICD-10-CM | POA: Diagnosis not present

## 2015-02-06 DIAGNOSIS — R1012 Left upper quadrant pain: Secondary | ICD-10-CM | POA: Diagnosis present

## 2015-02-06 DIAGNOSIS — K567 Ileus, unspecified: Secondary | ICD-10-CM | POA: Diagnosis present

## 2015-02-06 LAB — COMPREHENSIVE METABOLIC PANEL
ALBUMIN: 4.1 g/dL (ref 3.5–5.0)
ALT: 17 U/L (ref 17–63)
ANION GAP: 9 (ref 5–15)
AST: 20 U/L (ref 15–41)
Alkaline Phosphatase: 46 U/L (ref 38–126)
BUN: 14 mg/dL (ref 6–20)
CO2: 23 mmol/L (ref 22–32)
Calcium: 8 mg/dL — ABNORMAL LOW (ref 8.9–10.3)
Chloride: 107 mmol/L (ref 101–111)
Creatinine, Ser: 0.86 mg/dL (ref 0.61–1.24)
GFR calc Af Amer: >60 mL/min (ref >60)
GFR calc non Af Amer: >60 mL/min (ref >60)
Glucose, Bld: 95 mg/dL (ref 65–99)
Potassium: 3.6 mmol/L (ref 3.5–5.1)
Sodium: 139 mmol/L (ref 135–145)
Total Bilirubin: 1.5 mg/dL — ABNORMAL HIGH (ref 0.3–1.2)
Total Protein: 6.7 g/dL (ref 6.5–8.1)

## 2015-02-06 LAB — URINE MICROSCOPIC-ADD ON

## 2015-02-06 LAB — CBC WITH DIFFERENTIAL/PLATELET
BASOS ABS: 0 10*3/uL (ref 0.0–0.1)
Basophils Relative: 0 % (ref 0–1)
EOS ABS: 0 10*3/uL (ref 0.0–0.7)
Eosinophils Relative: 0 % (ref 0–5)
HCT: 44.7 % (ref 39.0–52.0)
HEMOGLOBIN: 15.3 g/dL (ref 13.0–17.0)
LYMPHS PCT: 3 % — AB (ref 12–46)
Lymphs Abs: 0.4 10*3/uL — ABNORMAL LOW (ref 0.7–4.0)
MCH: 30.4 pg (ref 26.0–34.0)
MCHC: 34.2 g/dL (ref 30.0–36.0)
MCV: 88.9 fL (ref 78.0–100.0)
MONOS PCT: 7 % (ref 3–12)
Monocytes Absolute: 0.9 10*3/uL (ref 0.1–1.0)
Neutro Abs: 11.1 10*3/uL — ABNORMAL HIGH (ref 1.7–7.7)
Neutrophils Relative %: 90 % — ABNORMAL HIGH (ref 43–77)
PLATELETS: 232 10*3/uL (ref 150–400)
RBC: 5.03 MIL/uL (ref 4.22–5.81)
RDW: 12.6 % (ref 11.5–15.5)
WBC: 12.4 10*3/uL — ABNORMAL HIGH (ref 4.0–10.5)

## 2015-02-06 LAB — URINALYSIS, ROUTINE W REFLEX MICROSCOPIC
Bilirubin Urine: NEGATIVE
GLUCOSE, UA: NEGATIVE mg/dL
Ketones, ur: NEGATIVE mg/dL
Leukocytes, UA: NEGATIVE
Nitrite: NEGATIVE
Protein, ur: NEGATIVE mg/dL
Urobilinogen, UA: 0.2 mg/dL (ref 0.0–1.0)
pH: 5 (ref 5.0–8.0)

## 2015-02-06 LAB — MRSA PCR SCREENING: MRSA BY PCR: POSITIVE — AB

## 2015-02-06 LAB — LACTIC ACID, PLASMA
LACTIC ACID, VENOUS: 0.6 mmol/L (ref 0.5–2.0)
Lactic Acid, Venous: 0.9 mmol/L (ref 0.5–2.0)
Lactic Acid, Venous: 0.9 mmol/L (ref 0.5–2.0)

## 2015-02-06 LAB — PROTIME-INR
INR: 1.16 (ref 0.00–1.49)
Prothrombin Time: 15 seconds (ref 11.6–15.2)

## 2015-02-06 LAB — CORTISOL: CORTISOL PLASMA: 13.7 ug/dL

## 2015-02-06 LAB — APTT: aPTT: 31 seconds (ref 24–37)

## 2015-02-06 LAB — LIPASE, BLOOD: Lipase: 20 U/L — ABNORMAL LOW (ref 22–51)

## 2015-02-06 LAB — PROCALCITONIN

## 2015-02-06 MED ORDER — SODIUM CHLORIDE 0.9 % IJ SOLN
3.0000 mL | Freq: Two times a day (BID) | INTRAMUSCULAR | Status: DC
  Administered 2015-02-06 – 2015-02-07 (×2): 3 mL via INTRAVENOUS

## 2015-02-06 MED ORDER — SODIUM CHLORIDE 0.9 % IV BOLUS (SEPSIS)
1000.0000 mL | Freq: Once | INTRAVENOUS | Status: AC
  Administered 2015-02-06: 1000 mL via INTRAVENOUS

## 2015-02-06 MED ORDER — ONDANSETRON HCL 4 MG/2ML IJ SOLN
4.0000 mg | Freq: Once | INTRAMUSCULAR | Status: AC
  Administered 2015-02-06: 4 mg via INTRAVENOUS

## 2015-02-06 MED ORDER — ONDANSETRON HCL 4 MG/2ML IJ SOLN
INTRAMUSCULAR | Status: AC
  Filled 2015-02-06: qty 2

## 2015-02-06 MED ORDER — PNEUMOCOCCAL VAC POLYVALENT 25 MCG/0.5ML IJ INJ
0.5000 mL | INJECTION | INTRAMUSCULAR | Status: AC
  Administered 2015-02-07: 0.5 mL via INTRAMUSCULAR
  Filled 2015-02-06: qty 0.5

## 2015-02-06 MED ORDER — PIPERACILLIN-TAZOBACTAM 3.375 G IVPB 30 MIN
3.3750 g | Freq: Once | INTRAVENOUS | Status: AC
  Administered 2015-02-06: 3.375 g via INTRAVENOUS
  Filled 2015-02-06: qty 50

## 2015-02-06 MED ORDER — ONDANSETRON HCL 4 MG/2ML IJ SOLN
4.0000 mg | Freq: Four times a day (QID) | INTRAMUSCULAR | Status: DC | PRN
  Administered 2015-02-06 – 2015-02-07 (×2): 4 mg via INTRAVENOUS
  Filled 2015-02-06 (×2): qty 2

## 2015-02-06 MED ORDER — SODIUM CHLORIDE 0.9 % IV SOLN
INTRAVENOUS | Status: DC
  Administered 2015-02-07 – 2015-02-08 (×3): via INTRAVENOUS

## 2015-02-06 MED ORDER — MORPHINE SULFATE 2 MG/ML IJ SOLN
2.0000 mg | INTRAMUSCULAR | Status: DC | PRN
  Administered 2015-02-06 (×2): 2 mg via INTRAVENOUS
  Filled 2015-02-06 (×2): qty 1

## 2015-02-06 MED ORDER — FENTANYL CITRATE (PF) 100 MCG/2ML IJ SOLN
INTRAMUSCULAR | Status: AC
  Administered 2015-02-06: 75 ug via INTRAVENOUS
  Filled 2015-02-06: qty 2

## 2015-02-06 MED ORDER — GERHARDT'S BUTT CREAM
TOPICAL_CREAM | Freq: Once | CUTANEOUS | Status: AC
  Administered 2015-02-06: 1 via TOPICAL
  Filled 2015-02-06: qty 1

## 2015-02-06 MED ORDER — FENTANYL CITRATE (PF) 100 MCG/2ML IJ SOLN
75.0000 ug | Freq: Once | INTRAMUSCULAR | Status: AC
Start: 2015-02-06 — End: 2015-02-06
  Administered 2015-02-06: 75 ug via INTRAVENOUS

## 2015-02-06 MED ORDER — CHLORHEXIDINE GLUCONATE CLOTH 2 % EX PADS
6.0000 | MEDICATED_PAD | Freq: Every day | CUTANEOUS | Status: DC
  Administered 2015-02-07 – 2015-02-08 (×2): 6 via TOPICAL

## 2015-02-06 MED ORDER — LIDOCAINE VISCOUS 2 % MT SOLN
15.0000 mL | Freq: Once | OROMUCOSAL | Status: AC
  Administered 2015-02-06: 15 mL via OROMUCOSAL
  Filled 2015-02-06: qty 15

## 2015-02-06 MED ORDER — FENTANYL CITRATE (PF) 100 MCG/2ML IJ SOLN
75.0000 ug | Freq: Once | INTRAMUSCULAR | Status: AC
  Administered 2015-02-06: 75 ug via INTRAVENOUS
  Filled 2015-02-06: qty 2

## 2015-02-06 MED ORDER — SODIUM CHLORIDE 0.9 % IV BOLUS (SEPSIS)
500.0000 mL | Freq: Once | INTRAVENOUS | Status: AC
  Administered 2015-02-07: 500 mL via INTRAVENOUS

## 2015-02-06 MED ORDER — ACETAMINOPHEN 650 MG RE SUPP: 650.0000 mg | Freq: Four times a day (QID) | RECTAL | Status: DC | PRN

## 2015-02-06 MED ORDER — ACETAMINOPHEN 325 MG PO TABS: 650.0000 mg | ORAL_TABLET | Freq: Four times a day (QID) | ORAL | Status: DC | PRN

## 2015-02-06 MED ORDER — ONDANSETRON HCL 4 MG PO TABS: 4.0000 mg | ORAL_TABLET | Freq: Four times a day (QID) | ORAL | Status: DC | PRN

## 2015-02-06 MED ORDER — FENTANYL CITRATE (PF) 100 MCG/2ML IJ SOLN
75.0000 ug | Freq: Once | INTRAMUSCULAR | Status: AC
  Administered 2015-02-06: 75 ug via INTRAVENOUS

## 2015-02-06 MED ORDER — KETOROLAC TROMETHAMINE 30 MG/ML IJ SOLN
30.0000 mg | Freq: Once | INTRAMUSCULAR | Status: AC
  Administered 2015-02-06: 30 mg via INTRAVENOUS
  Filled 2015-02-06: qty 1

## 2015-02-06 MED ORDER — FENTANYL CITRATE (PF) 100 MCG/2ML IJ SOLN
INTRAMUSCULAR | Status: AC
  Filled 2015-02-06: qty 2

## 2015-02-06 MED ORDER — MUPIROCIN 2 % EX OINT
1.0000 "application " | TOPICAL_OINTMENT | Freq: Two times a day (BID) | CUTANEOUS | Status: DC
  Administered 2015-02-07 – 2015-02-09 (×5): 1 via NASAL
  Filled 2015-02-06 (×2): qty 22

## 2015-02-06 MED ORDER — IOHEXOL 300 MG/ML  SOLN
100.0000 mL | Freq: Once | INTRAMUSCULAR | Status: AC | PRN
  Administered 2015-02-06: 100 mL via INTRAVENOUS

## 2015-02-06 MED ORDER — PIPERACILLIN-TAZOBACTAM 3.375 G IVPB
3.3750 g | Freq: Three times a day (TID) | INTRAVENOUS | Status: DC
  Administered 2015-02-06 – 2015-02-08 (×6): 3.375 g via INTRAVENOUS
  Filled 2015-02-06 (×9): qty 50

## 2015-02-06 MED ORDER — IOHEXOL 300 MG/ML  SOLN
25.0000 mL | Freq: Once | INTRAMUSCULAR | Status: AC | PRN
  Administered 2015-02-06: 25 mL via ORAL

## 2015-02-06 MED ORDER — ONDANSETRON HCL 4 MG/2ML IJ SOLN
4.0000 mg | Freq: Once | INTRAMUSCULAR | Status: DC
  Filled 2015-02-06: qty 2

## 2015-02-06 MED ORDER — MORPHINE SULFATE 2 MG/ML IJ SOLN
2.0000 mg | INTRAMUSCULAR | Status: DC | PRN
  Administered 2015-02-07 (×2): 2 mg via INTRAVENOUS
  Filled 2015-02-06 (×2): qty 1
  Filled 2015-02-06: qty 2

## 2015-02-06 NOTE — Progress Notes (Addendum)
ANTIBIOTIC CONSULT NOTE  Pharmacy Consult for Zosyn Indication: intra-abdominal infection  No Known Allergies  Patient Measurements: Height: 5\' 7"  (170.2 cm) Weight: 170 lb (77.111 kg) IBW/kg (Calculated) : 66.1  Vital Signs: Temp: 99.4 F (37.4 C) (06/26 1333) Temp Source: Oral (06/26 1333) BP: 101/43 mmHg (06/26 1307) Pulse Rate: 88 (06/26 1307) Intake/Output from previous day:   Intake/Output from this shift:    Labs:  Recent Labs  02/06/15 0950  WBC 12.4*  HGB 15.3  PLT 232  CREATININE 0.86   Estimated Creatinine Clearance: 119.6 mL/min (by C-G formula based on Cr of 0.86). No results for input(s): VANCOTROUGH, VANCOPEAK, VANCORANDOM, GENTTROUGH, GENTPEAK, GENTRANDOM, TOBRATROUGH, TOBRAPEAK, TOBRARND, AMIKACINPEAK, AMIKACINTROU, AMIKACIN in the last 72 hours.   Microbiology: No results found for this or any previous visit (from the past 720 hour(s)).  Anti-infectives    Start     Dose/Rate Route Frequency Ordered Stop   02/06/15 1415  piperacillin-tazobactam (ZOSYN) IVPB 3.375 g     3.375 g 100 mL/hr over 30 Minutes Intravenous  Once 02/06/15 1403        Assessment: 29 yo M with hx PSBO in March 2015 developed vomiting & diarrhea last night.  Concern for sepsis so empiric Zosyn was initiated.  Transferring to [facility] for [facility] consult.  He is afebrile with mild leukocytosis.  Lactic acid is elevated. Renal function is at patient's baseline.   Goal of Therapy:  Eradicate infection.  Plan:  Zosyn 3.375gm IV Q8h to be infused over 4hrs Monitor renal function and cx data   Biagio Borg 02/06/2015,2:09 PM  Addendum:  Patient has excellent renal function and it is unlikely that Zosyn dose will need further adjustment.  Rx will sign off.  V[employer] monitoring software will alert of a change in renal function that requires dose change in the event that this does occur.  Thank you, [employer], Pharm.D., BCPS Clinical Pharmacist Pager  [phone] 02/07/2015 1:58 PM

## 2015-02-06 NOTE — Progress Notes (Signed)
Pt is hypotensive systolic in the 57'Q. Rogue Bussing notified and gave order for a 1L bolus. Will give and continue to monitor.

## 2015-02-06 NOTE — ED Notes (Signed)
Dr Goodrich at bedside

## 2015-02-06 NOTE — Consult Note (Signed)
Reason for Consult:SBO diarrhea N/V since last night  Referring Physician: Thereasa Solo MD  [name] [name] is an 29 y.o. male.  HPI: Pt seen this AM att [facility] for diarrhea,  Nausea  and vomiting with left sided abdominal pain since last night.  Has a pSBO last year managed non operatively and lap chole in the past.  He has had 50 BM since last night according to the patient. No blood in his stool.  He states the diarrhea is predominant symptom.  Denies any sick contact or bad food.  CT report states possible ileus vs early SBO.  Less than 100 cc of NGT output recorded. Sent from [facility] due to soft BP.  N o blood in emesis now blood tinged with NGT.   History reviewed. No pertinent past medical history.  Past Surgical History  Procedure Laterality Date  . Cholecystectomy    . Abdominal surgery      Family History  Problem Relation Age of Onset  . Colon cancer      grandmother    Social History:  reports that he has been smoking Cigarettes.  He has a 3.5 pack-year smoking history. He has never used smokeless tobacco. He reports that he does not drink alcohol or use illicit drugs.  Allergies: No Known Allergies  Medications: I have reviewed the patient's current medications.  Results for orders placed or performed during the hospital encounter of 02/06/15 (from the past 48 hour(s))  CBC with Differential     Status: Abnormal   Collection Time: 02/06/15  9:50 AM  Result Value Ref Range   WBC 12.4 (H) 4.0 - 10.5 K/uL   RBC 5.03 4.22 - 5.81 MIL/uL   Hemoglobin 15.3 13.0 - 17.0 g/dL   HCT 44.7 39.0 - 52.0 %   MCV 88.9 78.0 - 100.0 fL   MCH 30.4 26.0 - 34.0 pg   MCHC 34.2 30.0 - 36.0 g/dL   RDW 12.6 11.5 - 15.5 %   Platelets 232 150 - 400 K/uL   Neutrophils Relative % 90 (H) 43 - 77 %   Neutro Abs 11.1 (H) 1.7 - 7.7 K/uL   Lymphocytes Relative 3 (L) 12 - 46 %   Lymphs Abs 0.4 (L) 0.7 - 4.0 K/uL   Monocytes Relative 7 3 - 12 %   Monocytes Absolute 0.9 0.1 - 1.0 K/uL   Eosinophils Relative 0 0 - 5  %   Eosinophils Absolute 0.0 0.0 - 0.7 K/uL   Basophils Relative 0 0 - 1 %   Basophils Absolute 0.0 0.0 - 0.1 K/uL  Comprehensive metabolic panel     Status: Abnormal   Collection Time: 02/06/15  9:50 AM  Result Value Ref Range   Sodium 139 135 - 145 mmol/L   Potassium 3.6 3.5 - 5.1 mmol/L   Chloride 107 101 - 111 mmol/L   CO2 23 22 - 32 mmol/L   Glucose, Bld 95 65 - 99 mg/dL   BUN 14 6 - 20 mg/dL   Creatinine, Ser 0.86 0.61 - 1.24 mg/dL   Calcium 8.0 (L) 8.9 - 10.3 mg/dL   Total Protein 6.7 6.5 - 8.1 g/dL   Albumin 4.1 3.5 - 5.0 g/dL   AST 20 15 - 41 U/L   ALT 17 17 - 63 U/L   Alkaline Phosphatase 46 38 - 126 U/L   Total Bilirubin 1.5 (H) 0.3 - 1.2 mg/dL   GFR calc non Af Amer >60 >60 mL/min   GFR calc Af Amer >60 >  60 mL/min    Comment: (NOTE) The eGFR has been calculated using the CKD EPI equation. This calculation has not been validated in all clinical situations. eGFR's persistently <60 mL/min signify possible Chronic Kidney Disease.    Anion gap 9 5 - 15  Lipase, blood     Status: Abnormal   Collection Time: 02/06/15  9:50 AM  Result Value Ref Range   Lipase 20 (L) 22 - 51 U/L  Urinalysis, Routine w reflex microscopic (not at [facility])     Status: Abnormal   Collection Time: 02/06/15 11:29 AM  Result Value Ref Range   Color, Urine YELLOW YELLOW   APPearance CLEAR CLEAR   Specific Gravity, Urine <1.005 (L) 1.005 - 1.030   pH 5.0 5.0 - 8.0   Glucose, UA NEGATIVE NEGATIVE mg/dL   Hgb urine dipstick SMALL (A) NEGATIVE   Bilirubin Urine NEGATIVE NEGATIVE   Ketones, ur NEGATIVE NEGATIVE mg/dL   Protein, ur NEGATIVE NEGATIVE mg/dL   Urobilinogen, UA 0.2 0.0 - 1.0 mg/dL   Nitrite NEGATIVE NEGATIVE   Leukocytes, UA NEGATIVE NEGATIVE  Urine microscopic-add on     Status: None   Collection Time: 02/06/15 11:29 AM  Result Value Ref Range   WBC, UA 0-2 <3 WBC/hpf   RBC / HPF 3-6 <3 RBC/hpf  Lactic acid, plasma     Status: None   Collection Time: 02/06/15  1:26 PM  Result  Value Ref Range   Lactic Acid, Venous 0.9 0.5 - 2.0 mmol/L  Procalcitonin     Status: None   Collection Time: 02/06/15  2:20 PM  Result Value Ref Range   Procalcitonin <0.10 ng/mL    Comment:        Interpretation: PCT (Procalcitonin) <= 0.5 ng/mL: Systemic infection (sepsis) is not likely. Local bacterial infection is possible. (NOTE)         ICU PCT Algorithm               Non ICU PCT Algorithm    ----------------------------     ------------------------------         PCT < 0.25 ng/mL                 PCT < 0.1 ng/mL     Stopping of antibiotics            Stopping of antibiotics       strongly encouraged.               strongly encouraged.    ----------------------------     ------------------------------       PCT level decrease by               PCT < 0.25 ng/mL       >= 80% from peak PCT       OR PCT 0.25 - 0.5 ng/mL          Stopping of antibiotics                                             encouraged.     Stopping of antibiotics           encouraged.    ----------------------------     ------------------------------       PCT level decrease by              PCT >= 0.25 ng/mL       <  80% from peak PCT        AND PCT >= 0.5 ng/mL            Continuin g antibiotics                                              encouraged.       Continuing antibiotics            encouraged.    ----------------------------     ------------------------------     PCT level increase compared          PCT > 0.5 ng/mL         with peak PCT AND          PCT >= 0.5 ng/mL             Escalation of antibiotics                                          strongly encouraged.      Escalation of antibiotics        strongly encouraged.   Protime-INR     Status: None   Collection Time: 02/06/15  2:20 PM  Result Value Ref Range   Prothrombin Time 15.0 11.6 - 15.2 seconds   INR 1.16 0.00 - 1.49  APTT     Status: None   Collection Time: 02/06/15  2:20 PM  Result Value Ref Range   aPTT 31 24 - 37 seconds     Dg Abd 1 View  02/06/2015   CLINICAL DATA:  NG tube placement  EXAM: ABDOMEN - 1 VIEW  COMPARISON:  Earlier same day; CT abdomen pelvis - earlier same day  FINDINGS: Interval placement of enteric tube with side port projecting over the expected location of the gastroesophageal junction.  Paucity of bowel gas without evidence of obstruction. No supine evidence of pneumoperitoneum. No definite pneumatosis or portal venous gas.  Excreted contrast is seen within the bilateral renal collecting systems and superior aspect of the bilateral ureters. Post cholecystectomy.  Limited visualization adjacent thorax is normal.  IMPRESSION: Side port projects over the expected location the gastroesophageal junction. Advancement at least 10 cm is recommended.   Electronically Signed   By: Sandi Mariscal M.D.   On: 02/06/2015 13:36   Ct Abdomen Pelvis W Contrast  02/06/2015   CLINICAL DATA:  Abdominal pain, nausea and vomiting.  Diarrhea  EXAM: CT ABDOMEN AND PELVIS WITH CONTRAST  TECHNIQUE: Multidetector CT imaging of the abdomen and pelvis was performed using the standard protocol following bolus administration of intravenous contrast.  CONTRAST:  26m OMNIPAQUE IOHEXOL 300 MG/ML SOLN, 1044mOMNIPAQUE IOHEXOL 300 MG/ML SOLN  COMPARISON:  10/27/2013  FINDINGS: Lower chest: The lung bases are clear. No pleural or pericardial effusion.  Hepatobiliary: There is no suspicious liver abnormality identified. Previous coli cystectomy. No biliary dilatation.  Pancreas: Negative  Spleen: Normal appearance of the spleen.  Adrenals/Urinary Tract: The adrenal glands are normal. Normal appearance of the kidneys. The urinary bladder appears within normal limits.  Stomach/Bowel: The stomach is within normal limits. Within the left upper quadrant there are several loops of small bowel that appear increased in caliber measuring up to 2.8 cm. There does not appear to be progression of enteric  contrast material beyond these bowel loops. The mid  and distal small bowel loops appear normal. No obstruction. Normal appearance of the colon. The appendix is visualized and appears normal.  Vascular/Lymphatic: Normal appearance of the abdominal aorta. No enlarged retroperitoneal or mesenteric adenopathy. No enlarged pelvic or inguinal lymph nodes.  Reproductive: Prostate gland and seminal vesicles are unremarkable.  Other: There is no ascites or focal fluid collections within the abdomen or pelvis. No areas of inflammation noted.  Musculoskeletal: Review of the visualized osseous structures is negative. No aggressive lytic or sclerotic bone lesions identified.  IMPRESSION: 1. Within the left upper quadrant of the abdomen there are several dilated loops of small bowel with fluid levels. Findings may reflect small bowel obstruction or focal ileus. 2. The appendix is visualized and appears normal.   Electronically Signed   By: Kerby Moors M.D.   On: 02/06/2015 11:20   Dg Abd Acute W/chest  02/06/2015   CLINICAL DATA:  Abdominal pain. Vomiting, diarrhea since last night.  EXAM: DG ABDOMEN ACUTE W/ 1V CHEST  COMPARISON:  11/02/2013  FINDINGS: Cardiomediastinal silhouette is within normal limits. Lungs are clear. There is no free intraperitoneal air beneath the diaphragm.  Supine erect views of the abdomen shows surgical clips in the right upper quadrant. There are mildly prominent loops of small bowel which contain air-fluid levels on the erect view. The small bowel folds appear slightly thickened. Gas within scratched of the visualized large bowel loops appear unremarkable.  IMPRESSION: 1.  No evidence for acute cardiopulmonary abnormality. 2. Small bowel obstruction with question of thickened small bowel folds. Consider further evaluation CT of the abdomen and pelvis.   Electronically Signed   By: Nolon Nations M.D.   On: 02/06/2015 10:00    Review of Systems  Constitutional: Positive for fever.  HENT: Negative.   Eyes: Negative.   Cardiovascular:  Negative for chest pain and palpitations.  Gastrointestinal: Positive for nausea, vomiting, abdominal pain and diarrhea. Negative for blood in stool.  Genitourinary: Negative.   Musculoskeletal: Negative for myalgias.  Skin: Negative for itching and rash.  Neurological: Negative.   Psychiatric/Behavioral: Negative.    Blood pressure 109/51, pulse 87, temperature 100.2 F (37.9 C), temperature source Oral, resp. rate 0, height $RemoveB'5\' 7"'TuQgPOFA$  (1.702 m), weight 82.2 kg (181 lb 3.5 oz), SpO2 98 %. Physical Exam  Constitutional: He is oriented to person, place, and time. He appears well-developed.  HENT:  Head: Normocephalic and atraumatic.  NGT in place   Eyes: Pupils are equal, round, and reactive to light. No scleral icterus.  Neck: Normal range of motion.  Cardiovascular: Normal rate.   Respiratory: Effort normal.  GI: Soft. He exhibits distension. He exhibits no mass. There is tenderness in the left upper quadrant. There is no rebound and no guarding.    Musculoskeletal: Normal range of motion.  Neurological: He is alert and oriented to person, place, and time.  Skin: Skin is warm and dry.  Psychiatric: He has a normal mood and affect. His behavior is normal. Judgment and thought content normal.    Assessment/Plan: Abdominal pain Diarrhea/ nausea / vomiting Doubt clinically he has a SBO at this point since diarrhea predominant symptom and after reviewing CT images this is a very soft call  Nothing much out NGT so will D/C.  Normal lactate and procalcitonin so I think he is more dehydrated from multiple BM 'S.  Would ask GI to see.  Will repeat films/  labs in AM, Would keep NPO  for now. No hx of bloody BM according to wife and patient.  Non acute surgical abdomen at this point. Will follow along.   Amberlin Utke A. 02/06/2015, 7:19 PM

## 2015-02-06 NOTE — ED Notes (Signed)
Patient left ED at this time with [facility]. 

## 2015-02-06 NOTE — ED Provider Notes (Signed)
CSN: [id number]     Arrival date & time 02/06/15  0913 History   First MD Initiated Contact with Patient 02/06/15 0920     Chief Complaint  Patient presents with  . Abdominal Pain     (Consider location/radiation/quality/duration/timing/severity/associated sxs/prior Treatment) The history is provided by the patient and the EMS personnel.   [name] [name] is a 29 y.o. male with a prior history of abdominal pain, nausea and vomiting requiring hospitalization last year with presumptive diagnosis of either colitis or small bowel obstruction which resolved without need for surgical intervention.  Last night he developed periumbilical pain which now radiates upward and toward the left quadrants, diarrhea described as bright green, non bloody, and 50+ episodes and now vomiting and worsened pain which woke him around 4 am today.  He reports generalized fatigue and weakness.  He denies fevers, chest pain, but does endorse deep inspiration makes his abdominal pain worse, making it difficult to breath.  He has had no medicines prior to arrival, until ems arrived and gave zofran per IV with improvement in vomiting.  He has had no recent antibiotics, no exposures to persons with similar symptoms.  He has no family history of IBD, but states Crohns was mentioned last year as a possible problem with no definitive diagnosis.  He has not followed with GI since his last episode described above.       History reviewed. No pertinent past medical history. Past Surgical History  Procedure Laterality Date  . Cholecystectomy    . Abdominal surgery     Family History  Problem Relation Age of Onset  . Colon cancer      grandmother   History  Substance Use Topics  . Smoking status: Current Every Day Smoker -- 0.50 packs/day for 7 years    Types: Cigarettes  . Smokeless tobacco: Never Used  . Alcohol Use: No    Review of Systems  Constitutional: Negative for fever.  HENT: Negative for congestion and sore  throat.   Eyes: Negative.   Respiratory: Negative for chest tightness and shortness of breath.   Cardiovascular: Negative for chest pain.  Gastrointestinal: Positive for nausea, vomiting, abdominal pain, diarrhea and abdominal distention. Negative for blood in stool.  Genitourinary: Negative.   Musculoskeletal: Negative for joint swelling, arthralgias and neck pain.  Skin: Negative.  Negative for rash and wound.  Neurological: Negative for dizziness, weakness, light-headedness, numbness and headaches.  Psychiatric/Behavioral: Negative.       Allergies  Review of patient's allergies indicates no known allergies.  Home Medications   Prior to Admission medications   Medication Sig Start Date End Date Taking? Authorizing Provider  amoxicillin-clavulanate (AUGMENTIN) 875-125 MG per tablet Take 1 tablet by mouth every 12 (twelve) hours. Patient not taking: Reported on 02/06/2015 10/31/13   Nita Sells, MD  HYDROcodone-acetaminophen (NORCO) 10-325 MG per tablet Take 1 tablet by mouth every 6 (six) hours as needed for moderate pain or severe pain. Patient not taking: Reported on 02/06/2015 10/31/13   Nita Sells, MD  nicotine (NICODERM CQ - DOSED IN MG/24 HOURS) 14 mg/24hr patch Place 1 patch (14 mg total) onto the skin daily. Patient not taking: Reported on 02/06/2015 10/31/13   Nita Sells, MD   BP 101/43 mmHg  Pulse 88  Temp(Src) 99.4 F (37.4 C) (Oral)  Resp 17  Ht 5\' 7"  (1.702 m)  Wt 170 lb (77.111 kg)  BMI 26.62 kg/m2  SpO2 97% Physical Exam  Constitutional: He appears well-developed and well-nourished.  HENT:  Head: Normocephalic and atraumatic.  Eyes: Conjunctivae are normal.  Neck: Normal range of motion.  Cardiovascular: Normal rate, regular rhythm, normal heart sounds and intact distal pulses.   Pulmonary/Chest: Effort normal and breath sounds normal. He has no wheezes.  Abdominal: Soft. He exhibits distension. Bowel sounds are decreased. There is  generalized tenderness. There is guarding. There is no CVA tenderness.  Musculoskeletal: Normal range of motion.  Neurological: He is alert.  Skin: Skin is warm and dry.  Psychiatric: He has a normal mood and affect.  Nursing note and vitals reviewed.   ED Course  Procedures (including critical care time) Labs Review Labs Reviewed  CBC WITH DIFFERENTIAL/PLATELET - Abnormal; Notable for the following:    WBC 12.4 (*)    Neutrophils Relative % 90 (*)    Neutro Abs 11.1 (*)    Lymphocytes Relative 3 (*)    Lymphs Abs 0.4 (*)    All other components within normal limits  COMPREHENSIVE METABOLIC PANEL - Abnormal; Notable for the following:    Calcium 8.0 (*)    Total Bilirubin 1.5 (*)    All other components within normal limits  LIPASE, BLOOD - Abnormal; Notable for the following:    Lipase 20 (*)    All other components within normal limits  URINALYSIS, ROUTINE W REFLEX MICROSCOPIC (NOT AT [facility]) - Abnormal; Notable for the following:    Specific Gravity, Urine <1.005 (*)    Hgb urine dipstick SMALL (*)    All other components within normal limits  CULTURE, BLOOD (ROUTINE X 2)  CULTURE, BLOOD (ROUTINE X 2)  CLOSTRIDIUM DIFFICILE BY PCR (NOT AT [facility])  URINE MICROSCOPIC-ADD ON  LACTIC ACID, PLASMA  LACTIC ACID, PLASMA  LACTIC ACID, PLASMA  PROCALCITONIN  PROTIME-INR  APTT  CORTISOL  GI PATHOGEN PANEL BY PCR, STOOL    Imaging Review Dg Abd 1 View  02/06/2015   CLINICAL DATA:  NG tube placement  EXAM: ABDOMEN - 1 VIEW  COMPARISON:  Earlier same day; CT abdomen pelvis - earlier same day  FINDINGS: Interval placement of enteric tube with side port projecting over the expected location of the gastroesophageal junction.  Paucity of bowel gas without evidence of obstruction. No supine evidence of pneumoperitoneum. No definite pneumatosis or portal venous gas.  Excreted contrast is seen within the bilateral renal collecting systems and superior aspect of the bilateral ureters. Post  cholecystectomy.  Limited visualization adjacent thorax is normal.  IMPRESSION: Side port projects over the expected location the gastroesophageal junction. Advancement at least 10 cm is recommended.   Electronically Signed   By: Sandi Mariscal M.D.   On: 02/06/2015 13:36   Ct Abdomen Pelvis W Contrast  02/06/2015   CLINICAL DATA:  Abdominal pain, nausea and vomiting.  Diarrhea  EXAM: CT ABDOMEN AND PELVIS WITH CONTRAST  TECHNIQUE: Multidetector CT imaging of the abdomen and pelvis was performed using the standard protocol following bolus administration of intravenous contrast.  CONTRAST:  42mL OMNIPAQUE IOHEXOL 300 MG/ML SOLN, 18mL OMNIPAQUE IOHEXOL 300 MG/ML SOLN  COMPARISON:  10/27/2013  FINDINGS: Lower chest: The lung bases are clear. No pleural or pericardial effusion.  Hepatobiliary: There is no suspicious liver abnormality identified. Previous coli cystectomy. No biliary dilatation.  Pancreas: Negative  Spleen: Normal appearance of the spleen.  Adrenals/Urinary Tract: The adrenal glands are normal. Normal appearance of the kidneys. The urinary bladder appears within normal limits.  Stomach/Bowel: The stomach is within normal limits. Within the left upper quadrant there are several loops of  small bowel that appear increased in caliber measuring up to 2.8 cm. There does not appear to be progression of enteric contrast material beyond these bowel loops. The mid and distal small bowel loops appear normal. No obstruction. Normal appearance of the colon. The appendix is visualized and appears normal.  Vascular/Lymphatic: Normal appearance of the abdominal aorta. No enlarged retroperitoneal or mesenteric adenopathy. No enlarged pelvic or inguinal lymph nodes.  Reproductive: Prostate gland and seminal vesicles are unremarkable.  Other: There is no ascites or focal fluid collections within the abdomen or pelvis. No areas of inflammation noted.  Musculoskeletal: Review of the visualized osseous structures is negative.  No aggressive lytic or sclerotic bone lesions identified.  IMPRESSION: 1. Within the left upper quadrant of the abdomen there are several dilated loops of small bowel with fluid levels. Findings may reflect small bowel obstruction or focal ileus. 2. The appendix is visualized and appears normal.   Electronically Signed   By: Kerby Moors M.D.   On: 02/06/2015 11:20   Dg Abd Acute W/chest  02/06/2015   CLINICAL DATA:  Abdominal pain. Vomiting, diarrhea since last night.  EXAM: DG ABDOMEN ACUTE W/ 1V CHEST  COMPARISON:  11/02/2013  FINDINGS: Cardiomediastinal silhouette is within normal limits. Lungs are clear. There is no free intraperitoneal air beneath the diaphragm.  Supine erect views of the abdomen shows surgical clips in the right upper quadrant. There are mildly prominent loops of small bowel which contain air-fluid levels on the erect view. The small bowel folds appear slightly thickened. Gas within scratched of the visualized large bowel loops appear unremarkable.  IMPRESSION: 1.  No evidence for acute cardiopulmonary abnormality. 2. Small bowel obstruction with question of thickened small bowel folds. Consider further evaluation CT of the abdomen and pelvis.   Electronically Signed   By: Nolon Nations M.D.   On: 02/06/2015 10:00     EKG Interpretation None       CRITICAL CARE Performed by: Evalee Jefferson Total critical care time: 45 Critical care time was exclusive of separately billable procedures and treating other patients. Critical care was necessary to treat or prevent imminent or life-threatening deterioration. Critical care was time spent personally by me on the following activities: development of treatment plan with patient and/or surrogate as well as nursing, discussions with consultants, evaluation of patient's response to treatment, examination of patient, obtaining history from patient or surrogate, ordering and performing treatments and interventions, ordering and review of  laboratory studies, ordering and review of radiographic studies, pulse oximetry and re-evaluation of patient's condition.  MDM   Final diagnoses:  Abdominal pain  SBO (small bowel obstruction)   Patients labs and/or radiological studies were reviewed and considered during the medical decision making and disposition process.  Results were also discussed with patient.   2:19 PM Spoke with Dr. Sarajane Jews, requests general surg consult prior to decision for admitting pt here at [facility].  Discussed with Dr. Marlou Starks, gen surg in [place], will be happy to consult in transfer to [facility], although may be appropriate for medical admission here.  Discussed with Dr. Sarajane Jews who will eval in ed and decide if will admit here vs transfer to [facility].   Evalee Jefferson, PA-C 02/06/15 [place], PA-C 02/06/15 1451

## 2015-02-06 NOTE — Progress Notes (Signed)
UR COMPLETED  

## 2015-02-06 NOTE — ED Notes (Signed)
Attempted to call report to [facility] 3S. RN to call back.

## 2015-02-06 NOTE — H&P (Signed)
History and Physical  [name] [name] [id number]:[id number] DOB: [birth year] DOA: 02/06/2015  Referring physician: Joanell Rising, PA in ED PCP: [name]., MD  Primary Gastroenterologist: Dr. Laural Golden  Chief Complaint: diarrhea  HPI:  29yom with no PMH presented with sudden onset voluminous diarrhea and marked abdominal pain. Initial evaluation notable for CT evidence of SBO vs ileus, hypotension despite volume resuscitation. EDP d/w with surgeon who recommended transfer to [facility].  History obtained from pt, wife and mother. 10/2013 hospitalized for partial SBO, seen by GI and surgery, treated with conservative management. No recurrent GI issues until yesterday.  Felt well yesterday, went golfing, ate normally. Suddenly developed non-bloody diarrhea 6/25 about 10 pm, voluminous, >30 times, several episodes of vomiting. Marked left-sided abd pain. No apparent aggravating or alleviating factors. Felt hot at home. No sick contacts.  In the emergency department SBP 80-110s after 3 liters NS. HR, RR WNL. No hypoxia. CMP with normal AG, modest elevation of T bili 1.5. WBC 12.4. CXR no acute disease, independently reviewed. AXR SBO. CT abd/pelvis with dilated SB loops, SBO vs ileus.  Review of Systems:  Negative for fever, visual changes, sore throat, rash, new muscle aches, chest pain, SOB, dysuria, bleeding.   History reviewed. No pertinent past medical history.  No medical problems  Past Surgical History  Procedure Laterality Date  . Cholecystectomy    . Abdominal surgery      Social History:  reports that he has been smoking Cigarettes.  He has a 7 pack-year smoking history. He has never used smokeless tobacco. He reports that he does not drink alcohol or use illicit drugs. lives with their spouse Self-care  No Known Allergies  Family History  Problem Relation Age of Onset  . Colon cancer      grandmother     Prior to Admission medications   Medication Sig Start Date End Date Taking?  Authorizing Provider  amoxicillin-clavulanate (AUGMENTIN) 875-125 MG per tablet Take 1 tablet by mouth every 12 (twelve) hours. Patient not taking: Reported on 02/06/2015 10/31/13   Nita Sells, MD  HYDROcodone-acetaminophen (NORCO) 10-325 MG per tablet Take 1 tablet by mouth every 6 (six) hours as needed for moderate pain or severe pain. Patient not taking: Reported on 02/06/2015 10/31/13   Nita Sells, MD  nicotine (NICODERM CQ - DOSED IN MG/24 HOURS) 14 mg/24hr patch Place 1 patch (14 mg total) onto the skin daily. Patient not taking: Reported on 02/06/2015 10/31/13   Nita Sells, MD   Physical Exam: Filed Vitals:   02/06/15 1033 02/06/15 1120 02/06/15 1128 02/06/15 1237  BP: 90/46  117/53 85/59  Pulse: 99 100 104 102  Temp:      TempSrc:      Resp:  16 18 18   Height:      Weight:      SpO2: 100% 100% 100% 98%   General: examined in ED. Appears ill, uncomfortable but not toxic Eyes: PERRL, normal lids, irises   ENT: grossly normal hearing, lips  Neck: no LAD, masses or thyromegaly Cardiovascular: RRR, no m/r/g. No LE edema. Respiratory: CTA bilaterally, no w/r/r. Normal respiratory effort. Abdomen: soft, nondistended, generalized tenderness Skin: no rash or induration noted Musculoskeletal: grossly normal tone BUE/BLE Psychiatric: grossly normal mood and affect, speech fluent and appropriate. Oriented to self, location, month, year Neurologic: grossly non-focal.  Wt Readings from Last 3 Encounters:  02/06/15 77.111 kg (170 lb)  10/31/13 89.812 kg (198 lb)    Labs on Admission:  Basic Metabolic Panel:  Recent Labs  Lab 02/06/15 0950  NA 139  K 3.6  CL 107  CO2 23  GLUCOSE 95  BUN 14  CREATININE 0.86  CALCIUM 8.0*    Liver Function Tests:  Recent Labs Lab 02/06/15 0950  AST 20  ALT 17  ALKPHOS 46  BILITOT 1.5*  PROT 6.7  ALBUMIN 4.1    Recent Labs Lab 02/06/15 0950  LIPASE 20*     CBC:  Recent Labs Lab 02/06/15 0950  WBC  12.4*  NEUTROABS 11.1*  HGB 15.3  HCT 44.7  MCV 88.9  PLT 232    Radiological Exams on Admission: Ct Abdomen Pelvis W Contrast  02/06/2015   CLINICAL DATA:  Abdominal pain, nausea and vomiting.  Diarrhea  EXAM: CT ABDOMEN AND PELVIS WITH CONTRAST  TECHNIQUE: Multidetector CT imaging of the abdomen and pelvis was performed using the standard protocol following bolus administration of intravenous contrast.  CONTRAST:  73mL OMNIPAQUE IOHEXOL 300 MG/ML SOLN, 197mL OMNIPAQUE IOHEXOL 300 MG/ML SOLN  COMPARISON:  10/27/2013  FINDINGS: Lower chest: The lung bases are clear. No pleural or pericardial effusion.  Hepatobiliary: There is no suspicious liver abnormality identified. Previous coli cystectomy. No biliary dilatation.  Pancreas: Negative  Spleen: Normal appearance of the spleen.  Adrenals/Urinary Tract: The adrenal glands are normal. Normal appearance of the kidneys. The urinary bladder appears within normal limits.  Stomach/Bowel: The stomach is within normal limits. Within the left upper quadrant there are several loops of small bowel that appear increased in caliber measuring up to 2.8 cm. There does not appear to be progression of enteric contrast material beyond these bowel loops. The mid and distal small bowel loops appear normal. No obstruction. Normal appearance of the colon. The appendix is visualized and appears normal.  Vascular/Lymphatic: Normal appearance of the abdominal aorta. No enlarged retroperitoneal or mesenteric adenopathy. No enlarged pelvic or inguinal lymph nodes.  Reproductive: Prostate gland and seminal vesicles are unremarkable.  Other: There is no ascites or focal fluid collections within the abdomen or pelvis. No areas of inflammation noted.  Musculoskeletal: Review of the visualized osseous structures is negative. No aggressive lytic or sclerotic bone lesions identified.  IMPRESSION: 1. Within the left upper quadrant of the abdomen there are several dilated loops of small bowel  with fluid levels. Findings may reflect small bowel obstruction or focal ileus. 2. The appendix is visualized and appears normal.   Electronically Signed   By: Kerby Moors M.D.   On: 02/06/2015 11:20   Dg Abd Acute W/chest  02/06/2015   CLINICAL DATA:  Abdominal pain. Vomiting, diarrhea since last night.  EXAM: DG ABDOMEN ACUTE W/ 1V CHEST  COMPARISON:  11/02/2013  FINDINGS: Cardiomediastinal silhouette is within normal limits. Lungs are clear. There is no free intraperitoneal air beneath the diaphragm.  Supine erect views of the abdomen shows surgical clips in the right upper quadrant. There are mildly prominent loops of small bowel which contain air-fluid levels on the erect view. The small bowel folds appear slightly thickened. Gas within scratched of the visualized large bowel loops appear unremarkable.  IMPRESSION: 1.  No evidence for acute cardiopulmonary abnormality. 2. Small bowel obstruction with question of thickened small bowel folds. Consider further evaluation CT of the abdomen and pelvis.   Electronically Signed   By: Nolon Nations M.D.   On: 02/06/2015 10:00    EKG: none   Principal Problem:   SBO (small bowel obstruction) Active Problems:   Abdominal pain   Hypotension   Nausea vomiting and  diarrhea   Assessment/Plan 1. Voluminous vomiting and diarrhea acute onset, no blood. No recent abx, no sick contacts. 2. PSBO vs ileus with correlating left-sided abdominal pain. H/o PSBO 10/2013. H/o cholecystectomy and umbilical hernia repair.  3. Possible sepsis, GI source vs simple volume depletion, BP borderline s/p 3L NS. Favor volume depletion. Lactic acid 0.9.    Appears ill but not toxic. SPB 90s with 3 liters IVF. Case was d/w general surgery Dr. Marlou Starks who requested transfer to [facility] for evaluation (to hospitalist service). [facility] will see in consult.  Check BC, cdiff, GI pathogen panel  Start broad spectrum abx after cultures  Further volume as needed  Transfer to [facility]  [facility] team 1 Dr. Thereasa Solo  Discussed test results, clinical impression and recs with wife and mother at bedside as well as pt. All agree with plan  Code Status: full code  DVT prophylaxis:SCDs Family Communication:  Disposition Plan/Anticipated LOS: admit, 2-3 days  Time spent: 60 minutes  Murray Hodgkins, MD  [facility] Pager [phone] 02/06/2015, 12:48 PM

## 2015-02-06 NOTE — ED Notes (Signed)
Ems reports pt had an intestinal blockage last year that didn't require surgery.  Reports started having abd pain and diarrhea last night, vomiting this morning.

## 2015-02-06 NOTE — ED Notes (Signed)
[facility] on way to transport patient. ETA 30 minutes. Family informed.

## 2015-02-06 NOTE — ED Notes (Signed)
Two loose/watery stools in last 20 minutes. Patient became dizzy with standing with bedside commode. Patient returned to bed. 2nd request for BM, patient used bedpan without difficulty.

## 2015-02-06 NOTE — Progress Notes (Signed)
[facility] hospitalist progress note. Chief complaint. Transfer note. History of present illness. This 29 year old male presented to [facility] with complaints of abdominal pain and diarrhea. Patient had a prior history of a small bowel obstruction treated with conservative management. Workup today suggested partial small bowel obstruction and patient transferred to [facility] per General surgery request. Patient has now arrived in transfer and I'm seeing him at bedside to ensure he remains clinically stable and that his orders transferred here appropriately. Patient complains of NG tube discomfort and abdominal pain. Physical exam. Vital signs. Temperature 100.2, pulse 97, respiration 26, blood pressure 109/51. O2 sats 98%. General appearance. Well-developed male who is alert and in no distress. Cardiac. Rate and rhythm regular. Lungs. Breath sounds clear and equal. Abdomen. Soft with hypotonic bowel sounds. Diffuse tenderness with palpation but no guarding. There is some rebound tenderness. Impression/plan. Problem #1. Partial small bowel obstruction. Patient is are even seen by surgery who feels that this is not an operative issue at this time. Surgery is discontinued NG tube and we'll be treating patient conservatively. Pain management appears somewhat inadequate and I have increased the patient's morphine dosing. Patient appears clinically stable post transfer and all orders appear to of transferred appropriately.

## 2015-02-06 NOTE — ED Provider Notes (Signed)
Medical screening examination/treatment/procedure(s) were conducted as a shared visit with non-physician practitioner(s) and myself.  I personally evaluated the patient during the encounter.   EKG Interpretation None      Pt is a 29 y.o. male with prior history of umbilical hernia repair, cholecystectomy and has had a previous small bowel obstruction in 2015 who presents emergency department with abdominal pain, nausea, vomiting and diarrhea. States this feels similar to his obstruction that he has had the past. He has been intermittently hypotensive in the ED but this will improve with IV fluids. No fever. CT confirms small bowel obstruction. Surgery has been consulted. Medicine to admit.  [place], DO 02/06/15 1503

## 2015-02-06 NOTE — ED Notes (Signed)
Patient requesting pain medication. Dr Sarajane Jews paged and informed. Awaiting orders.

## 2015-02-06 NOTE — ED Notes (Signed)
[facility] at bedside 

## 2015-02-07 LAB — COMPREHENSIVE METABOLIC PANEL
ALK PHOS: 36 U/L — AB (ref 38–126)
ALT: 29 U/L (ref 17–63)
ANION GAP: 5 (ref 5–15)
AST: 30 U/L (ref 15–41)
Albumin: 2.7 g/dL — ABNORMAL LOW (ref 3.5–5.0)
BUN: 7 mg/dL (ref 6–20)
CO2: 22 mmol/L (ref 22–32)
Calcium: 6.5 mg/dL — ABNORMAL LOW (ref 8.9–10.3)
Chloride: 112 mmol/L — ABNORMAL HIGH (ref 101–111)
Creatinine, Ser: 0.95 mg/dL (ref 0.61–1.24)
GFR calc Af Amer: >60 mL/min (ref >60)
GFR calc non Af Amer: >60 mL/min (ref >60)
GLUCOSE: 84 mg/dL (ref 65–99)
POTASSIUM: 3.1 mmol/L — AB (ref 3.5–5.1)
Sodium: 139 mmol/L (ref 135–145)
Total Bilirubin: 1.3 mg/dL — ABNORMAL HIGH (ref 0.3–1.2)
Total Protein: 4.9 g/dL — ABNORMAL LOW (ref 6.5–8.1)

## 2015-02-07 LAB — CLOSTRIDIUM DIFFICILE BY PCR: Toxigenic C. Difficile by PCR: NEGATIVE

## 2015-02-07 LAB — CBC
HCT: 36.6 % — ABNORMAL LOW (ref 39.0–52.0)
HEMOGLOBIN: 12.4 g/dL — AB (ref 13.0–17.0)
MCH: 29.9 pg (ref 26.0–34.0)
MCHC: 33.9 g/dL (ref 30.0–36.0)
MCV: 88.2 fL (ref 78.0–100.0)
Platelets: 161 10*3/uL (ref 150–400)
RBC: 4.15 MIL/uL — AB (ref 4.22–5.81)
RDW: 12.7 % (ref 11.5–15.5)
WBC: 6 10*3/uL (ref 4.0–10.5)

## 2015-02-07 MED ORDER — HYDROMORPHONE HCL 1 MG/ML IJ SOLN
1.0000 mg | INTRAMUSCULAR | Status: DC | PRN
  Administered 2015-02-07 – 2015-02-08 (×15): 1 mg via INTRAVENOUS
  Filled 2015-02-07 (×15): qty 1

## 2015-02-07 NOTE — Care Management Note (Signed)
Case Management Note  Patient Details  Name: [name] [name] MRN: [id number] Date of Birth: 10-17-1985  Subjective/Objective:                 Pt from home with wife,  admitted with c/o  diarrhea and  abdominal pain and hypotension.   Action/Plan: Return to home when medically stable. CM to f/u with d/c needs.  Expected Discharge Date:                  Expected Discharge Plan:  Home/Self Care  In-House Referral:     Discharge planning Services  CM Consult  Post Acute Care Choice:    Choice offered to:     DME Arranged:    DME Agency:     HH Arranged:    HH Agency:     Status of Service:  In process, will continue to follow  Medicare Important Message Given:    Date Medicare IM Given:    Medicare IM give by:    Date Additional Medicare IM Given:    Additional Medicare Important Message give by:     If discussed at [place] of Stay Meetings, dates discussed:    Additional Comments: Patient Contacts Saabir Blyth (Spouse)  [phone]  Sharin Mons, RN 02/07/2015, 12:10 PM

## 2015-02-07 NOTE — Progress Notes (Signed)
[name] [name] LXB:[id number] DOB: 04-Feb-1986 DOA: 02/06/2015 PCP: [name]., MD  Brief narrative: 29 y/o ?, admitted 10/27/13 with nausea vomiting abdominal pain occurring since 10/26/13. Known prior history of cholecystectomy 8 years ago 4163, and umbilical hernia surgery 2009 both at [place].  Represented to [facility] Ed and admitted to [facility] as no Gen surgery coverage c concerns for P SBO vs Infectious colitis  Past medical history-As per Problem list Chart reviewed as below-   Consultants:  Gen surgery  Procedures:  none  Antibiotics:  none   Subjective   Feels a little better. No other issues tolerating some po    Objective    Interim History:   Telemetry:    Objective: Filed Vitals:   02/07/15 1230 02/07/15 1400 02/07/15 1644 02/07/15 1705  BP: 106/41 103/45  123/53  Pulse: 61 56 72 66  Temp: 97.6 F (36.4 C)  98.1 F (36.7 C) 98.7 F (37.1 C)  TempSrc: Oral  Oral Oral  Resp: 17 19 17 18   Height:    5\' 10"  (1.778 m)  Weight:    82.555 kg (182 lb)  SpO2: 98% 96% 97% 96%    Intake/Output Summary (Last 24 hours) at 02/07/15 1727 Last data filed at 02/07/15 1600  Gross per 24 hour  Intake 3752.5 ml  Output    350 ml  Net 3402.5 ml    Exam:  General: eomi ncat Cardiovascular: s1 s2 no m/r/g Respiratory: cta b Abdomen: slightly tender Skin no le edema   Data Reviewed: Basic Metabolic Panel:  Recent Labs Lab 02/06/15 0950 02/07/15 0236  NA 139 139  K 3.6 3.1*  CL 107 112*  CO2 23 22  GLUCOSE 95 84  BUN 14 7  CREATININE 0.86 0.95  CALCIUM 8.0* 6.5*   Liver Function Tests:  Recent Labs Lab 02/06/15 0950 02/07/15 0236  AST 20 30  ALT 17 29  ALKPHOS 46 36*  BILITOT 1.5* 1.3*  PROT 6.7 4.9*  ALBUMIN 4.1 2.7*    Recent Labs Lab 02/06/15 0950  LIPASE 20*   No results for input(s): AMMONIA in the last 168 hours. CBC:  Recent Labs Lab 02/06/15 0950 02/07/15 0236  WBC 12.4* 6.0  NEUTROABS 11.1*  --   HGB 15.3  12.4*  HCT 44.7 36.6*  MCV 88.9 88.2  PLT 232 161   Cardiac Enzymes: No results for input(s): CKTOTAL, CKMB, CKMBINDEX, TROPONINI in the last 168 hours. BNP: Invalid input(s): POCBNP CBG: No results for input(s): GLUCAP in the last 168 hours.  Recent Results (from the past 240 hour(s))  Culture, blood (routine x 2)     Status: None (Preliminary result)   Collection Time: 02/06/15  2:20 PM  Result Value Ref Range Status   Specimen Description RIGHT ANTECUBITAL  Final   Special Requests   Final    BOTTLES DRAWN AEROBIC AND ANAEROBIC AEB 10CC ANA 10CC   Culture NO GROWTH < 24 HOURS  Final   Report Status PENDING  Incomplete  Culture, blood (routine x 2)     Status: None (Preliminary result)   Collection Time: 02/06/15  2:26 PM  Result Value Ref Range Status   Specimen Description BLOOD LEFT HAND  Final   Special Requests   Final    BOTTLES DRAWN AEROBIC AND ANAEROBIC AEB 8CC ANA 10CC   Culture NO GROWTH < 24 HOURS  Final   Report Status PENDING  Incomplete  MRSA PCR Screening     Status: Abnormal   Collection Time:  02/06/15  7:23 PM  Result Value Ref Range Status   MRSA by PCR POSITIVE (A) NEGATIVE Final    Comment:        The GeneXpert MRSA Assay (FDA approved for NASAL specimens only), is one component of a comprehensive MRSA colonization surveillance program. It is not intended to diagnose MRSA infection nor to guide or monitor treatment for MRSA infections. RESULT CALLED TO, READ BACK BY AND VERIFIED WITH: WORK,W RN 2103 02/06/15 MITCHELL,L   Clostridium Difficile by PCR (not at [facility])     Status: None   Collection Time: 02/07/15  1:40 PM  Result Value Ref Range Status   C difficile by pcr NEGATIVE NEGATIVE Final     Studies:              All Imaging reviewed and is as per above notation   Scheduled Meds: . Chlorhexidine Gluconate Cloth  6 each Topical Q0600  . mupirocin ointment  1 application Nasal BID  . piperacillin-tazobactam (ZOSYN)  IV  3.375 g  Intravenous 3 times per day  . sodium chloride  3 mL Intravenous Q12H   Continuous Infusions: . sodium chloride 150 mL/hr at 02/07/15 1600     Assessment/Plan:   1. Partial SBO-appreciate gen surg input-contacted GI Dr. Olevia Perches office.we will obtain records in am from there.  He has had a work-up that is allegedly neg for crohn;s etc and presented in almost the exact same way a year ago when i took care of him at [facility]-continue Zosyn for now 2. Chronic low back pain-I mentioned that there is limited role for narcotic discuss the R/b/a opiates in terms of slowing down peristalsis -patient clearly understands my discussion with him. 3. Tobacco use-mentioned to quit-prescribed a patch on discharge 4. Possible bipolar   Code Status: full Family Communication:  D/w mothe rbedside Disposition Plan:  inpatient   Verneita Griffes, MD  [facility] Pager [phone] 02/07/2015, 5:27 PM    LOS: 1 day

## 2015-02-07 NOTE — Progress Notes (Signed)
Subjective: Patient still with episodes of LUQ abdominal pain Mild nausea earlier this morning - no vomiting No more diarrhea since admission - awaiting sample for stool studies Wants to have something to drink  Objective: Vital signs in last 24 hours: Temp:  [98 F (36.7 C)-100.2 F (37.9 C)] 98.4 F (36.9 C) (06/27 0412) Pulse Rate:  [61-104] 65 (06/27 0600) Resp:  [0-26] 14 (06/27 0600) BP: (84-121)/(31-67) 98/41 mmHg (06/27 0600) SpO2:  [93 %-100 %] 97 % (06/27 0600) Weight:  [77.111 kg (170 lb)-82.2 kg (181 lb 3.5 oz)] 82.2 kg (181 lb 3.5 oz) (06/26 1700) Last BM Date: 02/06/15  Intake/Output from previous day: 06/26 0701 - 06/27 0700 In: 2252.5 [I.V.:1602.5; IV Piggyback:650] Out: 450 [Urine:350; Emesis/NG output:100] Intake/Output this shift:    General appearance: alert, cooperative and no distress Resp: clear to auscultation bilaterally Cardio: regular rate and rhythm, S1, S2 normal, no murmur, click, rub or gallop GI: mildly distended; hypoactive bowel sounds; moderate LUQ tenderness without peritoneal signs  Lab Results:   Recent Labs  02/06/15 0950 02/07/15 0236  WBC 12.4* 6.0  HGB 15.3 12.4*  HCT 44.7 36.6*  PLT 232 161   BMET  Recent Labs  02/06/15 0950 02/07/15 0236  NA 139 139  K 3.6 3.1*  CL 107 112*  CO2 23 22  GLUCOSE 95 84  BUN 14 7  CREATININE 0.86 0.95  CALCIUM 8.0* 6.5*   PT/INR  Recent Labs  02/06/15 1420  LABPROT 15.0  INR 1.16   ABG No results for input(s): PHART, HCO3 in the last 72 hours.  Invalid input(s): PCO2, PO2  Studies/Results: Dg Abd 1 View  02/06/2015   CLINICAL DATA:  NG tube placement  EXAM: ABDOMEN - 1 VIEW  COMPARISON:  Earlier same day; CT abdomen pelvis - earlier same day  FINDINGS: Interval placement of enteric tube with side port projecting over the expected location of the gastroesophageal junction.  Paucity of bowel gas without evidence of obstruction. No supine evidence of pneumoperitoneum. No  definite pneumatosis or portal venous gas.  Excreted contrast is seen within the bilateral renal collecting systems and superior aspect of the bilateral ureters. Post cholecystectomy.  Limited visualization adjacent thorax is normal.  IMPRESSION: Side port projects over the expected location the gastroesophageal junction. Advancement at least 10 cm is recommended.   Electronically Signed   By: Sandi Mariscal M.D.   On: 02/06/2015 13:36   Ct Abdomen Pelvis W Contrast  02/06/2015   CLINICAL DATA:  Abdominal pain, nausea and vomiting.  Diarrhea  EXAM: CT ABDOMEN AND PELVIS WITH CONTRAST  TECHNIQUE: Multidetector CT imaging of the abdomen and pelvis was performed using the standard protocol following bolus administration of intravenous contrast.  CONTRAST:  40mL OMNIPAQUE IOHEXOL 300 MG/ML SOLN, 114mL OMNIPAQUE IOHEXOL 300 MG/ML SOLN  COMPARISON:  10/27/2013  FINDINGS: Lower chest: The lung bases are clear. No pleural or pericardial effusion.  Hepatobiliary: There is no suspicious liver abnormality identified. Previous coli cystectomy. No biliary dilatation.  Pancreas: Negative  Spleen: Normal appearance of the spleen.  Adrenals/Urinary Tract: The adrenal glands are normal. Normal appearance of the kidneys. The urinary bladder appears within normal limits.  Stomach/Bowel: The stomach is within normal limits. Within the left upper quadrant there are several loops of small bowel that appear increased in caliber measuring up to 2.8 cm. There does not appear to be progression of enteric contrast material beyond these bowel loops. The mid and distal small bowel loops appear normal. No obstruction.  Normal appearance of the colon. The appendix is visualized and appears normal.  Vascular/Lymphatic: Normal appearance of the abdominal aorta. No enlarged retroperitoneal or mesenteric adenopathy. No enlarged pelvic or inguinal lymph nodes.  Reproductive: Prostate gland and seminal vesicles are unremarkable.  Other: There is no  ascites or focal fluid collections within the abdomen or pelvis. No areas of inflammation noted.  Musculoskeletal: Review of the visualized osseous structures is negative. No aggressive lytic or sclerotic bone lesions identified.  IMPRESSION: 1. Within the left upper quadrant of the abdomen there are several dilated loops of small bowel with fluid levels. Findings may reflect small bowel obstruction or focal ileus. 2. The appendix is visualized and appears normal.   Electronically Signed   By: Kerby Moors M.D.   On: 02/06/2015 11:20   Dg Abd Acute W/chest  02/06/2015   CLINICAL DATA:  Abdominal pain. Vomiting, diarrhea since last night.  EXAM: DG ABDOMEN ACUTE W/ 1V CHEST  COMPARISON:  11/02/2013  FINDINGS: Cardiomediastinal silhouette is within normal limits. Lungs are clear. There is no free intraperitoneal air beneath the diaphragm.  Supine erect views of the abdomen shows surgical clips in the right upper quadrant. There are mildly prominent loops of small bowel which contain air-fluid levels on the erect view. The small bowel folds appear slightly thickened. Gas within scratched of the visualized large bowel loops appear unremarkable.  IMPRESSION: 1.  No evidence for acute cardiopulmonary abnormality. 2. Small bowel obstruction with question of thickened small bowel folds. Consider further evaluation CT of the abdomen and pelvis.   Electronically Signed   By: Nolon Nations M.D.   On: 02/06/2015 10:00    Anti-infectives: Anti-infectives    Start     Dose/Rate Route Frequency Ordered Stop   02/06/15 2200  piperacillin-tazobactam (ZOSYN) IVPB 3.375 g     3.375 g 12.5 mL/hr over 240 Minutes Intravenous 3 times per day 02/06/15 1432     02/06/15 1415  piperacillin-tazobactam (ZOSYN) IVPB 3.375 g     3.375 g 100 mL/hr over 30 Minutes Intravenous  Once 02/06/15 1403 02/06/15 1553      Assessment/Plan: s/p * No surgery found * PSBO vs. ileus from infectious causes (gastroenteritis)  No acute  surgical indications Would ask GI to see the patient Awaiting stool studies   LOS: 1 day    Yeni Jiggetts K. 02/07/2015

## 2015-02-08 ENCOUNTER — Inpatient Hospital Stay (HOSPITAL_COMMUNITY): Payer: 59

## 2015-02-08 LAB — COMPREHENSIVE METABOLIC PANEL
ALBUMIN: 3.1 g/dL — AB (ref 3.5–5.0)
ALK PHOS: 43 U/L (ref 38–126)
ALT: 23 U/L (ref 17–63)
ANION GAP: 3 — AB (ref 5–15)
AST: 23 U/L (ref 15–41)
CO2: 28 mmol/L (ref 22–32)
CREATININE: 0.97 mg/dL (ref 0.61–1.24)
Calcium: 7.7 mg/dL — ABNORMAL LOW (ref 8.9–10.3)
Chloride: 108 mmol/L (ref 101–111)
GFR calc non Af Amer: >60 mL/min (ref >60)
GLUCOSE: 80 mg/dL (ref 65–99)
Potassium: 3.2 mmol/L — ABNORMAL LOW (ref 3.5–5.1)
Sodium: 139 mmol/L (ref 135–145)
Total Bilirubin: 1.1 mg/dL (ref 0.3–1.2)
Total Protein: 5.2 g/dL — ABNORMAL LOW (ref 6.5–8.1)

## 2015-02-08 LAB — CBC
HCT: 35.6 % — ABNORMAL LOW (ref 39.0–52.0)
Hemoglobin: 12.1 g/dL — ABNORMAL LOW (ref 13.0–17.0)
MCH: 29.6 pg (ref 26.0–34.0)
MCHC: 34 g/dL (ref 30.0–36.0)
MCV: 87 fL (ref 78.0–100.0)
PLATELETS: 188 10*3/uL (ref 150–400)
RBC: 4.09 MIL/uL — ABNORMAL LOW (ref 4.22–5.81)
RDW: 12.5 % (ref 11.5–15.5)
WBC: 5.9 10*3/uL (ref 4.0–10.5)

## 2015-02-08 MED ORDER — ZOLPIDEM TARTRATE 5 MG PO TABS
5.0000 mg | ORAL_TABLET | Freq: Every evening | ORAL | Status: DC | PRN
  Administered 2015-02-08: 5 mg via ORAL
  Filled 2015-02-08: qty 1

## 2015-02-08 MED ORDER — AMOXICILLIN-POT CLAVULANATE 875-125 MG PO TABS
1.0000 | ORAL_TABLET | Freq: Two times a day (BID) | ORAL | Status: DC
  Administered 2015-02-08 – 2015-02-09 (×2): 1 via ORAL
  Filled 2015-02-08 (×3): qty 1

## 2015-02-08 MED ORDER — OXYCODONE-ACETAMINOPHEN 5-325 MG PO TABS
1.0000 | ORAL_TABLET | ORAL | Status: DC | PRN
  Administered 2015-02-08 (×2): 1 via ORAL
  Administered 2015-02-09 (×2): 2 via ORAL
  Filled 2015-02-08 (×4): qty 2
  Filled 2015-02-08: qty 1

## 2015-02-08 MED ORDER — AMOXICILLIN-POT CLAVULANATE ER 1000-62.5 MG PO TB12: 2.0000 | ORAL_TABLET | Freq: Two times a day (BID) | ORAL | Status: DC

## 2015-02-08 MED ORDER — POTASSIUM CHLORIDE CRYS ER 20 MEQ PO TBCR
40.0000 meq | EXTENDED_RELEASE_TABLET | Freq: Every day | ORAL | Status: DC
  Administered 2015-02-08 – 2015-02-09 (×2): 40 meq via ORAL
  Filled 2015-02-08 (×3): qty 2

## 2015-02-08 NOTE — Progress Notes (Signed)
[name] [name] NFA:[id number] DOB: 04/21/1986 DOA: 02/06/2015 PCP: [name]., MD  Brief narrative: 29 y/o ?, admitted 10/27/13 with nausea vomiting abdominal pain occurring since 10/26/13. Known prior history of cholecystectomy 8 years ago 4696, and umbilical hernia surgery 2009 both at [place].  Represented to [facility] and admitted to [facility] as no Gen surgery coverage c concerns for P SBO vs Infectious colitis  Past medical history-As per Problem list Chart reviewed as below-   Consultants:  Gen surgery  Procedures:  none  Antibiotics:  none   Subjective   Feels a little better. Small stool ambulating pain ? wants to eat    Objective    Interim History:   Telemetry:    Objective: Filed Vitals:   02/07/15 1705 02/07/15 2207 02/08/15 0446 02/08/15 1404  BP: 123/53 124/45 117/46 122/56  Pulse: 66 86 82 70  Temp: 98.7 F (37.1 C) 99.3 F (37.4 C) 100.1 F (37.8 C) 98.5 F (36.9 C)  TempSrc: Oral Oral  Oral  Resp: 18 20 16 16   Height: 5\' 10"  (1.778 m)     Weight: 82.555 kg (182 lb)     SpO2: 96% 97% 94% 95%    Intake/Output Summary (Last 24 hours) at 02/08/15 1535 Last data filed at 02/08/15 1300  Gross per 24 hour  Intake 3147.5 ml  Output   1575 ml  Net 1572.5 ml    Exam:  General: eomi ncat Cardiovascular: s1 s2 no m/r/g Respiratory: cta b Abdomen: slightly tender, ga Skin no le edema   Data Reviewed: Basic Metabolic Panel:  Recent Labs Lab 02/06/15 0950 02/07/15 0236 02/08/15 1300  NA 139 139 139  K 3.6 3.1* 3.2*  CL 107 112* 108  CO2 23 22 28   GLUCOSE 95 84 80  BUN 14 7 <5*  CREATININE 0.86 0.95 0.97  CALCIUM 8.0* 6.5* 7.7*   Liver Function Tests:  Recent Labs Lab 02/06/15 0950 02/07/15 0236 02/08/15 1300  AST 20 30 23   ALT 17 29 23   ALKPHOS 46 36* 43  BILITOT 1.5* 1.3* 1.1  PROT 6.7 4.9* 5.2*  ALBUMIN 4.1 2.7* 3.1*    Recent Labs Lab 02/06/15 0950  LIPASE 20*   No results for input(s): AMMONIA in the  last 168 hours. CBC:  Recent Labs Lab 02/06/15 0950 02/07/15 0236 02/08/15 1300  WBC 12.4* 6.0 5.9  NEUTROABS 11.1*  --   --   HGB 15.3 12.4* 12.1*  HCT 44.7 36.6* 35.6*  MCV 88.9 88.2 87.0  PLT 232 161 188   Cardiac Enzymes: No results for input(s): CKTOTAL, CKMB, CKMBINDEX, TROPONINI in the last 168 hours. BNP: Invalid input(s): POCBNP CBG: No results for input(s): GLUCAP in the last 168 hours.  Recent Results (from the past 240 hour(s))  Culture, blood (routine x 2)     Status: None (Preliminary result)   Collection Time: 02/06/15  2:20 PM  Result Value Ref Range Status   Specimen Description BLOOD RIGHT ANTECUBITAL  Final   Special Requests BOTTLES DRAWN AEROBIC AND ANAEROBIC 10CC EACH  Final   Culture NO GROWTH 2 DAYS  Final   Report Status PENDING  Incomplete  Culture, blood (routine x 2)     Status: None (Preliminary result)   Collection Time: 02/06/15  2:26 PM  Result Value Ref Range Status   Specimen Description BLOOD LEFT HAND  Final   Special Requests   Final    BOTTLES DRAWN AEROBIC AND ANAEROBIC AEB=8CC ANA=10CC   Culture NO GROWTH 2  DAYS  Final   Report Status PENDING  Incomplete  MRSA PCR Screening     Status: Abnormal   Collection Time: 02/06/15  7:23 PM  Result Value Ref Range Status   MRSA by PCR POSITIVE (A) NEGATIVE Final    Comment:        The GeneXpert MRSA Assay (FDA approved for NASAL specimens only), is one component of a comprehensive MRSA colonization surveillance program. It is not intended to diagnose MRSA infection nor to guide or monitor treatment for MRSA infections. RESULT CALLED TO, READ BACK BY AND VERIFIED WITH: WORK,W RN 2103 02/06/15 MITCHELL,L   Clostridium Difficile by PCR (not at [facility])     Status: None   Collection Time: 02/07/15  1:40 PM  Result Value Ref Range Status   C difficile by pcr NEGATIVE NEGATIVE Final     Studies:              All Imaging reviewed and is as per above notation   Scheduled Meds: .  Chlorhexidine Gluconate Cloth  6 each Topical Q0600  . mupirocin ointment  1 application Nasal BID  . piperacillin-tazobactam (ZOSYN)  IV  3.375 g Intravenous 3 times per day  . potassium chloride  40 mEq Oral Daily  . sodium chloride  3 mL Intravenous Q12H   Continuous Infusions: . sodium chloride 150 mL/hr at 02/08/15 1013     Assessment/Plan:   1. Partial SBO-appreciate gen surg input-contacted GI Dr. Laural Golden who states patient never followed up in office-graduate to soft diet by PM.   IV abx to PO abx and IV to PO pain meds on 6/28.  AXR 6/27 still shows some focal dilatation.   2. Chronic low back pain-I mentioned that there is limited role for narcotic discuss the R/b/a opiates in terms of slowing down peristalsis -patient clearly understands my discussion with him. 3. Tobacco use-mentioned to quit-prescribed a patch on discharge 4. Possible bipolar   Code Status: full Family Communication:  D/w mothe rbedside Disposition Plan:  inpatient   Verneita Griffes, MD  [facility] Pager [phone] 02/08/2015, 3:35 PM    LOS: 2 days

## 2015-02-08 NOTE — Progress Notes (Signed)
Patient ID: Delton Stelle, male   DOB: [birth year], 29 y.o.   MRN: [id number]    Subjective: Pt walking.  No nausea.  Passing lots of flatus.  Had a very small BM yesterday.  Still hurts some in the LUQ.  Objective: Vital signs in last 24 hours: Temp:  [97.6 F (36.4 C)-100.1 F (37.8 C)] 100.1 F (37.8 C) (06/28 0446) Pulse Rate:  [56-86] 82 (06/28 0446) Resp:  [10-20] 16 (06/28 0446) BP: (102-124)/(38-57) 117/46 mmHg (06/28 0446) SpO2:  [93 %-99 %] 94 % (06/28 0446) Weight:  [82.555 kg (182 lb)] 82.555 kg (182 lb) (06/27 1705) Last BM Date: 02/08/15  Intake/Output from previous day: 06/27 0701 - 06/28 0700 In: 4427.5 [P.O.:900; I.V.:3427.5; IV Piggyback:100] Out: 4098 [Urine:1575] Intake/Output this shift:    PE: Abd: soft, mild diffuse tenderness, but greatest in LUQ, +BS, ND Heart: regular  Lab Results:   Recent Labs  02/06/15 0950 02/07/15 0236  WBC 12.4* 6.0  HGB 15.3 12.4*  HCT 44.7 36.6*  PLT 232 161   BMET  Recent Labs  02/06/15 0950 02/07/15 0236  NA 139 139  K 3.6 3.1*  CL 107 112*  CO2 23 22  GLUCOSE 95 84  BUN 14 7  CREATININE 0.86 0.95  CALCIUM 8.0* 6.5*   PT/INR  Recent Labs  02/06/15 1420  LABPROT 15.0  INR 1.16   CMP     Component Value Date/Time   NA 139 02/07/2015 0236   K 3.1* 02/07/2015 0236   CL 112* 02/07/2015 0236   CO2 22 02/07/2015 0236   GLUCOSE 84 02/07/2015 0236   BUN 7 02/07/2015 0236   CREATININE 0.95 02/07/2015 0236   CALCIUM 6.5* 02/07/2015 0236   PROT 4.9* 02/07/2015 0236   ALBUMIN 2.7* 02/07/2015 0236   AST 30 02/07/2015 0236   ALT 29 02/07/2015 0236   ALKPHOS 36* 02/07/2015 0236   BILITOT 1.3* 02/07/2015 0236   GFRNONAA >60 02/07/2015 0236   GFRAA >60 02/07/2015 0236   Lipase     Component Value Date/Time   LIPASE 20* 02/06/2015 0950       Studies/Results: Dg Abd 1 View  02/06/2015   CLINICAL DATA:  NG tube placement  EXAM: ABDOMEN - 1 VIEW  COMPARISON:  Earlier same day; CT abdomen pelvis -  earlier same day  FINDINGS: Interval placement of enteric tube with side port projecting over the expected location of the gastroesophageal junction.  Paucity of bowel gas without evidence of obstruction. No supine evidence of pneumoperitoneum. No definite pneumatosis or portal venous gas.  Excreted contrast is seen within the bilateral renal collecting systems and superior aspect of the bilateral ureters. Post cholecystectomy.  Limited visualization adjacent thorax is normal.  IMPRESSION: Side port projects over the expected location the gastroesophageal junction. Advancement at least 10 cm is recommended.   Electronically Signed   By: Sandi Mariscal M.D.   On: 02/06/2015 13:36   Ct Abdomen Pelvis W Contrast  02/06/2015   CLINICAL DATA:  Abdominal pain, nausea and vomiting.  Diarrhea  EXAM: CT ABDOMEN AND PELVIS WITH CONTRAST  TECHNIQUE: Multidetector CT imaging of the abdomen and pelvis was performed using the standard protocol following bolus administration of intravenous contrast.  CONTRAST:  55mL OMNIPAQUE IOHEXOL 300 MG/ML SOLN, 178mL OMNIPAQUE IOHEXOL 300 MG/ML SOLN  COMPARISON:  10/27/2013  FINDINGS: Lower chest: The lung bases are clear. No pleural or pericardial effusion.  Hepatobiliary: There is no suspicious liver abnormality identified. Previous coli cystectomy. No biliary dilatation.  Pancreas: Negative  Spleen: Normal appearance of the spleen.  Adrenals/Urinary Tract: The adrenal glands are normal. Normal appearance of the kidneys. The urinary bladder appears within normal limits.  Stomach/Bowel: The stomach is within normal limits. Within the left upper quadrant there are several loops of small bowel that appear increased in caliber measuring up to 2.8 cm. There does not appear to be progression of enteric contrast material beyond these bowel loops. The mid and distal small bowel loops appear normal. No obstruction. Normal appearance of the colon. The appendix is visualized and appears normal.   Vascular/Lymphatic: Normal appearance of the abdominal aorta. No enlarged retroperitoneal or mesenteric adenopathy. No enlarged pelvic or inguinal lymph nodes.  Reproductive: Prostate gland and seminal vesicles are unremarkable.  Other: There is no ascites or focal fluid collections within the abdomen or pelvis. No areas of inflammation noted.  Musculoskeletal: Review of the visualized osseous structures is negative. No aggressive lytic or sclerotic bone lesions identified.  IMPRESSION: 1. Within the left upper quadrant of the abdomen there are several dilated loops of small bowel with fluid levels. Findings may reflect small bowel obstruction or focal ileus. 2. The appendix is visualized and appears normal.   Electronically Signed   By: Kerby Moors M.D.   On: 02/06/2015 11:20   Dg Abd Acute W/chest  02/08/2015   CLINICAL DATA:  History of tobacco use, vomiting and diarrhea for 3 days  EXAM: DG ABDOMEN ACUTE W/ 1V CHEST  COMPARISON:  02/06/2015  FINDINGS: Cardiac shadow is within normal limits. Previously seen nasogastric catheter has been removed. The lungs are clear without focal infiltrate.  Scattered contrast is noted throughout the colon consistent with the recent CT examination. There remain some mildly dilated loops of small bowel in the left upper quadrant. No other focal abnormality is seen. Changes of prior cholecystectomy are noted.  IMPRESSION: Persistent mild small bowel dilatation left upper quadrant. No other focal abnormality is noted.   Electronically Signed   By: Inez Catalina M.D.   On: 02/08/2015 07:50   Dg Abd Acute W/chest  02/06/2015   CLINICAL DATA:  Abdominal pain. Vomiting, diarrhea since last night.  EXAM: DG ABDOMEN ACUTE W/ 1V CHEST  COMPARISON:  11/02/2013  FINDINGS: Cardiomediastinal silhouette is within normal limits. Lungs are clear. There is no free intraperitoneal air beneath the diaphragm.  Supine erect views of the abdomen shows surgical clips in the right upper quadrant.  There are mildly prominent loops of small bowel which contain air-fluid levels on the erect view. The small bowel folds appear slightly thickened. Gas within scratched of the visualized large bowel loops appear unremarkable.  IMPRESSION: 1.  No evidence for acute cardiopulmonary abnormality. 2. Small bowel obstruction with question of thickened small bowel folds. Consider further evaluation CT of the abdomen and pelvis.   Electronically Signed   By: Nolon Nations M.D.   On: 02/06/2015 10:00    Anti-infectives: Anti-infectives    Start     Dose/Rate Route Frequency Ordered Stop   02/06/15 2200  piperacillin-tazobactam (ZOSYN) IVPB 3.375 g     3.375 g 12.5 mL/hr over 240 Minutes Intravenous 3 times per day 02/06/15 1432     02/06/15 1415  piperacillin-tazobactam (ZOSYN) IVPB 3.375 g     3.375 g 100 mL/hr over 30 Minutes Intravenous  Once 02/06/15 1403 02/06/15 1553       Assessment/Plan  1. Abdominal pain -? Ileus secondary to GE vs PSBO -patient is passing flatus and films show contrast  in his colon.  He does still have a few loops that are slightly dilated in the LUQ.  He is hungry.  Will try clear liquids today and see how he does. -all stool pathogen studies are still pending.   LOS: 2 days    [place] E 02/08/2015, 9:17 AM Pager: [phone]

## 2015-02-09 DIAGNOSIS — K567 Ileus, unspecified: Secondary | ICD-10-CM | POA: Diagnosis present

## 2015-02-09 LAB — GI PATHOGEN PANEL BY PCR, STOOL
C difficile toxin A/B: NOT DETECTED
Campylobacter by PCR: NOT DETECTED
Cryptosporidium by PCR: NOT DETECTED
E COLI (STEC): NOT DETECTED
E COLI 0157 BY PCR: NOT DETECTED
E coli (ETEC) LT/ST: NOT DETECTED
G LAMBLIA BY PCR: NOT DETECTED
Norovirus GI/GII: NOT DETECTED
Rotavirus A by PCR: NOT DETECTED
Salmonella by PCR: NOT DETECTED
Shigella by PCR: NOT DETECTED

## 2015-02-09 LAB — CBC WITH DIFFERENTIAL/PLATELET
BASOS ABS: 0 10*3/uL (ref 0.0–0.1)
Basophils Relative: 0 % (ref 0–1)
EOS PCT: 2 % (ref 0–5)
Eosinophils Absolute: 0.1 10*3/uL (ref 0.0–0.7)
HCT: 36.8 % — ABNORMAL LOW (ref 39.0–52.0)
Hemoglobin: 12.4 g/dL — ABNORMAL LOW (ref 13.0–17.0)
LYMPHS ABS: 1.2 10*3/uL (ref 0.7–4.0)
Lymphocytes Relative: 27 % (ref 12–46)
MCH: 29.2 pg (ref 26.0–34.0)
MCHC: 33.7 g/dL (ref 30.0–36.0)
MCV: 86.8 fL (ref 78.0–100.0)
Monocytes Absolute: 0.5 10*3/uL (ref 0.1–1.0)
Monocytes Relative: 12 % (ref 3–12)
Neutro Abs: 2.6 10*3/uL (ref 1.7–7.7)
Neutrophils Relative %: 59 % (ref 43–77)
PLATELETS: 186 10*3/uL (ref 150–400)
RBC: 4.24 MIL/uL (ref 4.22–5.81)
RDW: 12.8 % (ref 11.5–15.5)
WBC: 4.5 10*3/uL (ref 4.0–10.5)

## 2015-02-09 NOTE — Care Management Note (Signed)
Case Management Note  Patient Details  Name: Rhett Mutschler MRN: [id number] Date of Birth: 1986-05-03  Subjective/Objective:    Patient able to tolerate diet, patient will dc home today.                Action/Plan:   Expected Discharge Date:                  Expected Discharge Plan:  Home/Self Care  In-House Referral:     Discharge planning Services  CM Consult  Post Acute Care Choice:    Choice offered to:     DME Arranged:    DME Agency:     HH Arranged:    [place] Agency:     Status of Service:  Completed, signed off  Medicare Important Message Given:    Date Medicare IM Given:    Medicare IM give by:    Date Additional Medicare IM Given:    Additional Medicare Important Message give by:     If discussed at [place] of Stay Meetings, dates discussed:    Additional Comments:  Zenon Mayo, RN 02/09/2015, 11:43 AM

## 2015-02-09 NOTE — Discharge Summary (Signed)
Physician Discharge Summary  [name] [name] XAJ:[id number] DOB: [birth year] DOA: 02/06/2015  PCP: [name]., MD  Admit date: 02/06/2015 Discharge date: 02/09/2015  Time spent: 45 minutes  Recommendations for Outpatient Follow-up:  1. PCP Dr.[name] in 1 week 2. GI Dr.[name] in 2-3weeks  Discharge Diagnoses:    PSBO vs Ileus   Abdominal pain   Hypotension   Nausea vomiting and diarrhea   Ileus   Discharge Condition: stable  Diet recommendation: bland diet advance as tolerated  Filed Weights   02/06/15 0917 02/06/15 1700 02/07/15 1705  Weight: 77.111 kg (170 lb) 82.2 kg (181 lb 3.5 oz) 82.555 kg (182 lb)    History of present illness:  Chief Complaint: diarrhea  HPI:  29yom with no PMH presented with sudden onset voluminous diarrhea and marked abdominal pain. Initial evaluation notable for CT evidence of SBO vs ileus, hypotension despite volume resuscitation.  History obtained from pt, wife and mother. 10/2013 hospitalized for partial SBO, seen by GI and surgery, treated with conservative management. No recurrent GI issues until yesterday. Felt well yesterday, went golfing, ate normally. Suddenly developed non-bloody diarrhea 6/25 about 10 pm, voluminous, >30 times, several episodes of vomiting. Marked left-sided abd pain. No apparent aggravating or alleviating factors. Felt hot at home. No sick contacts.   Hospital Course:  1. Ileus-post infectious vs Partial SBO -. seen by General surgery, improved with supportive care, conservative management nothing by mouth, NG decompression, IV fluids etc., At this point is tolerating soft diet. - Was also briefly on IV antibiotic, subsequently stopped due to no other signs of ongoing infectious process. -He is advised to follow-up with his cast for neurologist Dr. Laural Golden 2. Chronic low back pain-advised stopping narcotics due to #1  3. Tobacco use-mentioned to quit-prescribed a patch on discharge  Consultations:  CCS  Discharge  Exam: Filed Vitals:   02/09/15 0620  BP:   Pulse: 56  Temp:   Resp:     General: AAOx3 Cardiovascular: S1S2/RRR Respiratory: CTAB  Discharge Instructions   Discharge Instructions    Discharge instructions    Complete by:  As directed   Soft bland diet, advance  As tolerated     Increase activity slowly    Complete by:  As directed           Current Discharge Medication List    CONTINUE these medications which have NOT CHANGED   Details  nicotine (NICODERM CQ - DOSED IN MG/24 HOURS) 14 mg/24hr patch Place 1 patch (14 mg total) onto the skin daily. Qty: 28 patch, Refills: 0      STOP taking these medications     amoxicillin-clavulanate (AUGMENTIN) 875-125 MG per tablet      HYDROcodone-acetaminophen (NORCO) 10-325 MG per tablet        No Known Allergies Follow-up Information    Follow up with [name]., MD. Schedule an appointment as soon as possible for a visit in 1 week.   Specialty:  Internal Medicine   Contact information:   [address] [place] [address] [phone]        The results of significant diagnostics from this hospitalization (including imaging, microbiology, ancillary and laboratory) are listed below for reference.    Significant Diagnostic Studies: Dg Abd 1 View  02/06/2015   CLINICAL DATA:  NG tube placement  EXAM: ABDOMEN - 1 VIEW  COMPARISON:  Earlier same day; CT abdomen pelvis - earlier same day  FINDINGS: Interval placement of enteric tube with side port projecting over the  expected location of the gastroesophageal junction.  Paucity of bowel gas without evidence of obstruction. No supine evidence of pneumoperitoneum. No definite pneumatosis or portal venous gas.  Excreted contrast is seen within the bilateral renal collecting systems and superior aspect of the bilateral ureters. Post cholecystectomy.  Limited visualization adjacent thorax is normal.  IMPRESSION: Side port projects over the expected location the  gastroesophageal junction. Advancement at least 10 cm is recommended.   Electronically Signed   By: Sandi Mariscal M.D.   On: 02/06/2015 13:36   Ct Abdomen Pelvis W Contrast  02/06/2015   CLINICAL DATA:  Abdominal pain, nausea and vomiting.  Diarrhea  EXAM: CT ABDOMEN AND PELVIS WITH CONTRAST  TECHNIQUE: Multidetector CT imaging of the abdomen and pelvis was performed using the standard protocol following bolus administration of intravenous contrast.  CONTRAST:  95mL OMNIPAQUE IOHEXOL 300 MG/ML SOLN, 138mL OMNIPAQUE IOHEXOL 300 MG/ML SOLN  COMPARISON:  10/27/2013  FINDINGS: Lower chest: The lung bases are clear. No pleural or pericardial effusion.  Hepatobiliary: There is no suspicious liver abnormality identified. Previous coli cystectomy. No biliary dilatation.  Pancreas: Negative  Spleen: Normal appearance of the spleen.  Adrenals/Urinary Tract: The adrenal glands are normal. Normal appearance of the kidneys. The urinary bladder appears within normal limits.  Stomach/Bowel: The stomach is within normal limits. Within the left upper quadrant there are several loops of small bowel that appear increased in caliber measuring up to 2.8 cm. There does not appear to be progression of enteric contrast material beyond these bowel loops. The mid and distal small bowel loops appear normal. No obstruction. Normal appearance of the colon. The appendix is visualized and appears normal.  Vascular/Lymphatic: Normal appearance of the abdominal aorta. No enlarged retroperitoneal or mesenteric adenopathy. No enlarged pelvic or inguinal lymph nodes.  Reproductive: Prostate gland and seminal vesicles are unremarkable.  Other: There is no ascites or focal fluid collections within the abdomen or pelvis. No areas of inflammation noted.  Musculoskeletal: Review of the visualized osseous structures is negative. No aggressive lytic or sclerotic bone lesions identified.  IMPRESSION: 1. Within the left upper quadrant of the abdomen there are  several dilated loops of small bowel with fluid levels. Findings may reflect small bowel obstruction or focal ileus. 2. The appendix is visualized and appears normal.   Electronically Signed   By: Kerby Moors M.D.   On: 02/06/2015 11:20   Dg Abd Acute W/chest  02/08/2015   CLINICAL DATA:  History of tobacco use, vomiting and diarrhea for 3 days  EXAM: DG ABDOMEN ACUTE W/ 1V CHEST  COMPARISON:  02/06/2015  FINDINGS: Cardiac shadow is within normal limits. Previously seen nasogastric catheter has been removed. The lungs are clear without focal infiltrate.  Scattered contrast is noted throughout the colon consistent with the recent CT examination. There remain some mildly dilated loops of small bowel in the left upper quadrant. No other focal abnormality is seen. Changes of prior cholecystectomy are noted.  IMPRESSION: Persistent mild small bowel dilatation left upper quadrant. No other focal abnormality is noted.   Electronically Signed   By: Inez Catalina M.D.   On: 02/08/2015 07:50   Dg Abd Acute W/chest  02/06/2015   CLINICAL DATA:  Abdominal pain. Vomiting, diarrhea since last night.  EXAM: DG ABDOMEN ACUTE W/ 1V CHEST  COMPARISON:  11/02/2013  FINDINGS: Cardiomediastinal silhouette is within normal limits. Lungs are clear. There is no free intraperitoneal air beneath the diaphragm.  Supine erect views of the abdomen shows  surgical clips in the right upper quadrant. There are mildly prominent loops of small bowel which contain air-fluid levels on the erect view. The small bowel folds appear slightly thickened. Gas within scratched of the visualized large bowel loops appear unremarkable.  IMPRESSION: 1.  No evidence for acute cardiopulmonary abnormality. 2. Small bowel obstruction with question of thickened small bowel folds. Consider further evaluation CT of the abdomen and pelvis.   Electronically Signed   By: Nolon Nations M.D.   On: 02/06/2015 10:00    Microbiology: Recent Results (from the past  240 hour(s))  Culture, blood (routine x 2)     Status: None (Preliminary result)   Collection Time: 02/06/15  2:20 PM  Result Value Ref Range Status   Specimen Description BLOOD RIGHT ANTECUBITAL  Final   Special Requests BOTTLES DRAWN AEROBIC AND ANAEROBIC 10CC EACH  Final   Culture NO GROWTH 3 DAYS  Final   Report Status PENDING  Incomplete  Culture, blood (routine x 2)     Status: None (Preliminary result)   Collection Time: 02/06/15  2:26 PM  Result Value Ref Range Status   Specimen Description BLOOD LEFT HAND  Final   Special Requests   Final    BOTTLES DRAWN AEROBIC AND ANAEROBIC AEB=8CC ANA=10CC   Culture NO GROWTH 3 DAYS  Final   Report Status PENDING  Incomplete  MRSA PCR Screening     Status: Abnormal   Collection Time: 02/06/15  7:23 PM  Result Value Ref Range Status   MRSA by PCR POSITIVE (A) NEGATIVE Final    Comment:        The GeneXpert MRSA Assay (FDA approved for NASAL specimens only), is one component of a comprehensive MRSA colonization surveillance program. It is not intended to diagnose MRSA infection nor to guide or monitor treatment for MRSA infections. RESULT CALLED TO, READ BACK BY AND VERIFIED WITH: WORK,W RN 2103 02/06/15 MITCHELL,L   Clostridium Difficile by PCR (not at [facility])     Status: None   Collection Time: 02/07/15  1:40 PM  Result Value Ref Range Status   C difficile by pcr NEGATIVE NEGATIVE Final     Labs: Basic Metabolic Panel:  Recent Labs Lab 02/06/15 0950 02/07/15 0236 02/08/15 1300  NA 139 139 139  K 3.6 3.1* 3.2*  CL 107 112* 108  CO2 23 22 28   GLUCOSE 95 84 80  BUN 14 7 <5*  CREATININE 0.86 0.95 0.97  CALCIUM 8.0* 6.5* 7.7*   Liver Function Tests:  Recent Labs Lab 02/06/15 0950 02/07/15 0236 02/08/15 1300  AST 20 30 23   ALT 17 29 23   ALKPHOS 46 36* 43  BILITOT 1.5* 1.3* 1.1  PROT 6.7 4.9* 5.2*  ALBUMIN 4.1 2.7* 3.1*    Recent Labs Lab 02/06/15 0950  LIPASE 20*   No results for input(s): AMMONIA in the  last 168 hours. CBC:  Recent Labs Lab 02/06/15 0950 02/07/15 0236 02/08/15 1300 02/09/15 0508  WBC 12.4* 6.0 5.9 4.5  NEUTROABS 11.1*  --   --  2.6  HGB 15.3 12.4* 12.1* 12.4*  HCT 44.7 36.6* 35.6* 36.8*  MCV 88.9 88.2 87.0 86.8  PLT 232 161 188 186   Cardiac Enzymes: No results for input(s): CKTOTAL, CKMB, CKMBINDEX, TROPONINI in the last 168 hours. BNP: BNP (last 3 results) No results for input(s): BNP in the last 8760 hours.  ProBNP (last 3 results) No results for input(s): PROBNP in the last 8760 hours.  CBG: No results for  input(s): GLUCAP in the last 168 hours.     SignedDomenic Polite  [facility] 02/09/2015, 11:20 AM

## 2015-02-09 NOTE — Progress Notes (Signed)
Patient ID: [name] [name], male   DOB: 11-10-1985, 29 y.o.   MRN: [id number]    Subjective: Pt doing better.  Tolerating a regular diet.  +BM.  Still with pain but less  Objective: Vital signs in last 24 hours: Temp:  [98 F (36.7 C)-98.5 F (36.9 C)] 98.2 F (36.8 C) (06/29 0601) Pulse Rate:  [42-70] 56 (06/29 0620) Resp:  [14-17] 14 (06/29 0601) BP: (100-125)/(55-56) 100/55 mmHg (06/29 0601) SpO2:  [93 %-99 %] 93 % (06/29 0601) Last BM Date: 02/08/15  Intake/Output from previous day: 06/28 0701 - 06/29 0700 In: 1840.8 [P.O.:942; I.V.:898.8] Out: 1675 [Urine:1675] Intake/Output this shift:    PE: Abd: soft, less tender, but still present, +BS, ND  Lab Results:   Recent Labs  02/08/15 1300 02/09/15 0508  WBC 5.9 4.5  HGB 12.1* 12.4*  HCT 35.6* 36.8*  PLT 188 186   BMET  Recent Labs  02/07/15 0236 02/08/15 1300  NA 139 139  K 3.1* 3.2*  CL 112* 108  CO2 22 28  GLUCOSE 84 80  BUN 7 <5*  CREATININE 0.95 0.97  CALCIUM 6.5* 7.7*   PT/INR  Recent Labs  02/06/15 1420  LABPROT 15.0  INR 1.16   CMP     Component Value Date/Time   NA 139 02/08/2015 1300   K 3.2* 02/08/2015 1300   CL 108 02/08/2015 1300   CO2 28 02/08/2015 1300   GLUCOSE 80 02/08/2015 1300   BUN <5* 02/08/2015 1300   CREATININE 0.97 02/08/2015 1300   CALCIUM 7.7* 02/08/2015 1300   PROT 5.2* 02/08/2015 1300   ALBUMIN 3.1* 02/08/2015 1300   AST 23 02/08/2015 1300   ALT 23 02/08/2015 1300   ALKPHOS 43 02/08/2015 1300   BILITOT 1.1 02/08/2015 1300   GFRNONAA >60 02/08/2015 1300   GFRAA >60 02/08/2015 1300   Lipase     Component Value Date/Time   LIPASE 20* 02/06/2015 0950       Studies/Results: Dg Abd Acute W/chest  02/08/2015   CLINICAL DATA:  History of tobacco use, vomiting and diarrhea for 3 days  EXAM: DG ABDOMEN ACUTE W/ 1V CHEST  COMPARISON:  02/06/2015  FINDINGS: Cardiac shadow is within normal limits. Previously seen nasogastric catheter has been removed. The lungs are  clear without focal infiltrate.  Scattered contrast is noted throughout the colon consistent with the recent CT examination. There remain some mildly dilated loops of small bowel in the left upper quadrant. No other focal abnormality is seen. Changes of prior cholecystectomy are noted.  IMPRESSION: Persistent mild small bowel dilatation left upper quadrant. No other focal abnormality is noted.   Electronically Signed   By: Inez Catalina M.D.   On: 02/08/2015 07:50    Anti-infectives: Anti-infectives    Start     Dose/Rate Route Frequency Ordered Stop   02/08/15 2200  amoxicillin-clavulanate (AUGMENTIN) 875-125 MG per tablet 1 tablet     1 tablet Oral Every 12 hours 02/08/15 1557     02/08/15 1545  amoxicillin-clavulanate (AUGMENTIN XR) 1000-62.5 MG per 12 hr tablet 2 tablet  Status:  Discontinued     2 tablet Oral Every 12 hours 02/08/15 1538 02/08/15 1557   02/06/15 2200  piperacillin-tazobactam (ZOSYN) IVPB 3.375 g  Status:  Discontinued     3.375 g 12.5 mL/hr over 240 Minutes Intravenous 3 times per day 02/06/15 1432 02/08/15 1538   02/06/15 1415  piperacillin-tazobactam (ZOSYN) IVPB 3.375 g     3.375 g 100 mL/hr over 30 Minutes  Intravenous  Once 02/06/15 1403 02/06/15 1553       Assessment/Plan  1. Abdominal pain -? Ileus secondary to GE vs PSBO -tolerating a regular diet.  No surgical indications -patient wanting to go home.  Surgically he is stable, will defer to medicine for that decision.  We will sign off.   LOS: 3 days    Dariyon Urquilla E 02/09/2015, 8:54 AM Pager: [phone]

## 2015-02-09 NOTE — Progress Notes (Signed)
Pt. Received discharge instructions with mom at bedside. Educated pt. On follow-up appointments. Removed IV. Gave pt. 2 tabs or percocet for 6/10 pain. No new skin issues noted. All questions answered. No further needs noted at this time.

## 2015-02-12 LAB — CULTURE, BLOOD (ROUTINE X 2)
CULTURE: NO GROWTH
Culture: NO GROWTH

## 2015-03-08 ENCOUNTER — Telehealth (INDEPENDENT_AMBULATORY_CARE_PROVIDER_SITE_OTHER): Payer: Self-pay | Admitting: *Deleted

## 2015-03-08 NOTE — Telephone Encounter (Signed)
Left a message for [name], wife, to return the call to schedule a hospital f/u for her husband on 02/16/15 and 02/18/15. (We had received a fax paper from her asking our office to please give her a call) Spoke with her on 02/21/15 to offered an apt and [name] [name] returned the call to say he could not get off work. Called [name] [name] on 03/07/15 to offer an apt the afternoon and he could not get off work. Patient said he would call back and never heard anything more.

## 2015-04-06 ENCOUNTER — Other Ambulatory Visit (HOSPITAL_COMMUNITY): Payer: Self-pay | Admitting: Family Medicine

## 2015-04-06 DIAGNOSIS — M25532 Pain in left wrist: Secondary | ICD-10-CM

## 2015-10-06 ENCOUNTER — Encounter (HOSPITAL_COMMUNITY): Payer: Self-pay | Admitting: Emergency Medicine

## 2015-10-06 ENCOUNTER — Emergency Department (HOSPITAL_COMMUNITY)
Admission: EM | Admit: 2015-10-06 | Discharge: 2015-10-06 | Disposition: A | Discharge status: Home / Self Care | Payer: Self-pay | Attending: Emergency Medicine | Admitting: Emergency Medicine

## 2015-10-06 ENCOUNTER — Emergency Department (HOSPITAL_COMMUNITY)
Admission: EM | Admit: 2015-10-06 | Discharge: 2015-10-06 | Discharge status: Against Medical Advice | Payer: Medicaid Other | Attending: Emergency Medicine | Admitting: Emergency Medicine

## 2015-10-06 ENCOUNTER — Emergency Department (HOSPITAL_COMMUNITY): Payer: Self-pay

## 2015-10-06 DIAGNOSIS — R197 Diarrhea, unspecified: Secondary | ICD-10-CM | POA: Insufficient documentation

## 2015-10-06 DIAGNOSIS — Z8719 Personal history of other diseases of the digestive system: Secondary | ICD-10-CM | POA: Insufficient documentation

## 2015-10-06 DIAGNOSIS — R1013 Epigastric pain: Secondary | ICD-10-CM | POA: Insufficient documentation

## 2015-10-06 DIAGNOSIS — Z79899 Other long term (current) drug therapy: Secondary | ICD-10-CM | POA: Insufficient documentation

## 2015-10-06 DIAGNOSIS — R109 Unspecified abdominal pain: Secondary | ICD-10-CM | POA: Insufficient documentation

## 2015-10-06 DIAGNOSIS — Z9049 Acquired absence of other specified parts of digestive tract: Secondary | ICD-10-CM | POA: Insufficient documentation

## 2015-10-06 DIAGNOSIS — R0789 Other chest pain: Secondary | ICD-10-CM | POA: Insufficient documentation

## 2015-10-06 DIAGNOSIS — F17210 Nicotine dependence, cigarettes, uncomplicated: Secondary | ICD-10-CM | POA: Insufficient documentation

## 2015-10-06 HISTORY — DX: Unspecified intestinal obstruction, unspecified as to partial versus complete obstruction: K56.609

## 2015-10-06 LAB — COMPREHENSIVE METABOLIC PANEL
ALT: 19 U/L (ref 17–63)
AST: 24 U/L (ref 15–41)
Albumin: 4.5 g/dL (ref 3.5–5.0)
Alkaline Phosphatase: 39 U/L (ref 38–126)
Anion gap: 8 (ref 5–15)
BUN: 10 mg/dL (ref 6–20)
CHLORIDE: 102 mmol/L (ref 101–111)
CO2: 27 mmol/L (ref 22–32)
Calcium: 8.9 mg/dL (ref 8.9–10.3)
Creatinine, Ser: 0.97 mg/dL (ref 0.61–1.24)
GFR calc non Af Amer: >60 mL/min (ref >60)
Glucose, Bld: 99 mg/dL (ref 65–99)
POTASSIUM: 3.7 mmol/L (ref 3.5–5.1)
SODIUM: 137 mmol/L (ref 135–145)
Total Bilirubin: 0.6 mg/dL (ref 0.3–1.2)
Total Protein: 7.5 g/dL (ref 6.5–8.1)

## 2015-10-06 LAB — CBC
HCT: 44.4 % (ref 39.0–52.0)
Hemoglobin: 15 g/dL (ref 13.0–17.0)
MCH: 30.1 pg (ref 26.0–34.0)
MCHC: 33.8 g/dL (ref 30.0–36.0)
MCV: 89.2 fL (ref 78.0–100.0)
Platelets: 296 10*3/uL (ref 150–400)
RBC: 4.98 MIL/uL (ref 4.22–5.81)
RDW: 12.8 % (ref 11.5–15.5)
WBC: 7.3 10*3/uL (ref 4.0–10.5)

## 2015-10-06 LAB — LIPASE, BLOOD: LIPASE: 26 U/L (ref 11–51)

## 2015-10-06 MED ORDER — IOHEXOL 300 MG/ML  SOLN
100.0000 mL | Freq: Once | INTRAMUSCULAR | Status: AC | PRN
  Administered 2015-10-06: 100 mL via INTRAVENOUS

## 2015-10-06 MED ORDER — SUCRALFATE 1 GM/10ML PO SUSP: 1.0000 g | Freq: Three times a day (TID) | ORAL | Status: DC

## 2015-10-06 MED ORDER — SODIUM CHLORIDE 0.9 % IV BOLUS (SEPSIS)
1000.0000 mL | Freq: Once | INTRAVENOUS | Status: AC
  Administered 2015-10-06: 1000 mL via INTRAVENOUS

## 2015-10-06 MED ORDER — OMEPRAZOLE 20 MG PO CPDR: 20.0000 mg | DELAYED_RELEASE_CAPSULE | Freq: Every day | ORAL | Status: DC

## 2015-10-06 MED ORDER — IOHEXOL 300 MG/ML  SOLN: 100.0000 mL | Freq: Once | INTRAMUSCULAR | Status: DC | PRN

## 2015-10-06 MED ORDER — FENTANYL CITRATE (PF) 100 MCG/2ML IJ SOLN
50.0000 ug | INTRAMUSCULAR | Status: DC | PRN
  Administered 2015-10-06 (×2): 50 ug via INTRAVENOUS
  Filled 2015-10-06: qty 2

## 2015-10-06 MED ORDER — GI COCKTAIL ~~LOC~~
30.0000 mL | Freq: Once | ORAL | Status: AC
  Administered 2015-10-06: 30 mL via ORAL
  Filled 2015-10-06: qty 30

## 2015-10-06 NOTE — ED Notes (Signed)
Patient transported to CT scan . 

## 2015-10-06 NOTE — ED Notes (Addendum)
Pt sent by dr. Gerarda Fraction reports he has had two small bowel obstructions (1 year ago and 1.5 years ago).  Pt having diarrhea, hypoactive bowel sounds, epigastric pain.  Pt took demerol and zofran at dr. Nolon Rod office.

## 2015-10-06 NOTE — ED Notes (Signed)
EDP explained possible cause of his chest pain , discharge plan explained to pt. By EDP.
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
## 2015-10-06 NOTE — ED Provider Notes (Signed)
CSN: [id number]     Arrival date & time 10/06/15  1940 History   First MD Initiated Contact with Patient 10/06/15 1955     Chief Complaint  Patient presents with  . Abdominal Pain     (Consider location/radiation/quality/duration/timing/severity/associated sxs/prior Treatment) Patient is a 30 y.o. male presenting with abdominal pain. The history is provided by the patient.  Abdominal Pain Pain location:  Epigastric Pain quality: aching and sharp   Pain radiates to:  Does not radiate Pain severity:  Moderate Onset quality:  Gradual Duration:  1 day Timing:  Constant Progression:  Unchanged Chronicity:  Recurrent Context: previous surgery (remotely)   Context: not alcohol use and not suspicious food intake   Context comment:  Diarrhea Relieved by:  Nothing Worsened by:  Nothing tried Ineffective treatments:  None tried Associated symptoms: diarrhea   Associated symptoms: no chest pain, no fever, no hematemesis and no vomiting   Risk factors comment:  H/o prior ileus/SBO   Past Medical History  Diagnosis Date  . Small bowel obstruction [facility])    Past Surgical History  Procedure Laterality Date  . Cholecystectomy    . Abdominal surgery    . Hernia repair     Family History  Problem Relation Age of Onset  . Colon cancer      grandmother   Social History  Substance Use Topics  . Smoking status: Current Every Day Smoker -- 0.00 packs/day for 7 years    Types: Cigarettes  . Smokeless tobacco: Never Used  . Alcohol Use: 0.0 oz/week    0 Standard drinks or equivalent per week    Review of Systems  Constitutional: Negative for fever.  Cardiovascular: Negative for chest pain.  Gastrointestinal: Positive for abdominal pain and diarrhea. Negative for vomiting and hematemesis.  All other systems reviewed and are negative.     Allergies  Review of patient's allergies indicates no known allergies.  Home Medications   Prior to Admission medications   Medication Sig  Start Date End Date Taking? Authorizing Provider  gabapentin (NEURONTIN) 300 MG capsule Take 300 mg by mouth 3 (three) times daily.   Yes Historical Provider, MD  nicotine (NICODERM CQ - DOSED IN MG/24 HOURS) 14 mg/24hr patch Place 1 patch (14 mg total) onto the skin daily. Patient not taking: Reported on 02/06/2015 10/31/13   Nita Sells, MD   BP 110/48 mmHg  Pulse 61  Temp(Src) 98.5 F (36.9 C) (Oral)  Resp 16  Ht 5\' 9"  (1.753 m)  Wt 180 lb (81.647 kg)  BMI 26.57 kg/m2  SpO2 97% Physical Exam  Constitutional: He is oriented to person, place, and time. He appears well-developed and well-nourished. No distress.  HENT:  Head: Normocephalic and atraumatic.  Eyes: Conjunctivae are normal.  Neck: Neck supple. No tracheal deviation present.  Cardiovascular: Normal rate and regular rhythm.   Pulmonary/Chest: Effort normal. No respiratory distress.  Abdominal: Soft. He exhibits no distension. There is tenderness (epigastric). There is no rebound and no guarding.  Neurological: He is alert and oriented to person, place, and time.  Skin: Skin is warm and dry.  Psychiatric: He has a normal mood and affect.    ED Course  Procedures (including critical care time) Labs Review Labs Reviewed - No data to display  Imaging Review Ct Abdomen Pelvis W Contrast  10/06/2015  CLINICAL DATA:  Abdominal pain starting today. EXAM: CT ABDOMEN AND PELVIS WITH CONTRAST TECHNIQUE: Multidetector CT imaging of the abdomen and pelvis was performed using the standard  protocol following bolus administration of intravenous contrast. CONTRAST:  100 cc Omnipaque 300 COMPARISON:  02/06/2015 FINDINGS: Lower chest:  Normal Hepatobiliary: No focal abnormality within the liver parenchyma. Gallbladder surgically absent. No intrahepatic or extrahepatic biliary dilation. Pancreas: No focal mass lesion. No dilatation of the main duct. No intraparenchymal cyst. No peripancreatic edema. Spleen: No splenomegaly. No focal  mass lesion. Adrenals/Urinary Tract: No adrenal nodule or mass. Kidneys have normal imaging features bilaterally. No evidence for hydroureter. The urinary bladder appears normal for the degree of distention. Stomach/Bowel: Stomach is nondistended. No gastric wall thickening. No evidence of outlet obstruction. Duodenum is normally positioned as is the ligament of Treitz. No small bowel wall thickening. No small bowel dilatation. The terminal ileum is normal. The appendix is normal. No gross colonic mass. No colonic wall thickening. No substantial diverticular change. Vascular/Lymphatic: No abdominal aortic aneurysm. No abdominal atherosclerotic calcification. There is no gastrohepatic or hepatoduodenal ligament lymphadenopathy. No intraperitoneal or retroperitoneal lymphadenopathy. No pelvic sidewall lymphadenopathy. Reproductive: The prostate gland and seminal vesicles have normal imaging features. Other: No intraperitoneal free fluid. Musculoskeletal: Bone windows reveal no worrisome lytic or sclerotic osseous lesions. IMPRESSION: No acute findings in the abdomen or pelvis. Specifically, no findings to explain the patient's history of pain. Electronically Signed   By: Misty Stanley M.D.   On: 10/06/2015 22:48   I have personally reviewed and evaluated these images and lab results as part of my medical decision-making.   EKG Interpretation None      MDM   Final diagnoses:  Epigastric pain  Diarrhea, unspecified type    30 y.o. male presents with diarrhea x1 day with epigastric pain that is similar to prior episode of SBO. Had labs performed at [facility] earlier but left d/t wait time, those were wnl. AFVSS during ED course. Given IVF for losses from diarrhea. CT performed here shows no obstruction, no acute findings. Pain is all epigastric and pt is s/p remote chole so doubt biliary pathology. Given GI cocktail and felt chest burning as side effect. Will start on PPI and carafate for presumed  gastritis of unclear etiology. Pt to f/u with PCP and refer to GI for endoscopy on OP basis if symptoms persist. Plan to follow up with PCP as needed and return precautions discussed for worsening or new concerning symptoms.     Leo Grosser, MD 10/07/15 [phone]

## 2015-10-06 NOTE — Discharge Instructions (Signed)

## 2015-10-06 NOTE — ED Notes (Addendum)
Pt. reports upper abdominal pain with nausea and diarrhea onset this morning , denies emesis , no fever or chills. Seen by PCP this morning suspects SBO sent to [facility] for CT scan but left AMA. Blood tests done at [facility].

## 2015-10-06 NOTE — ED Notes (Signed)
Called pt spouse to offer to hold bed if pt came back.  Pt spouse reported that he was already en route to [place] in ambulance.  Pt spouse reports that demerol that was taken in office today has worn off.

## 2015-10-10 MED ORDER — IOHEXOL 300 MG/ML  SOLN
100.0000 mL | Freq: Once | INTRAMUSCULAR | Status: AC | PRN
  Administered 2015-10-06: 100 mL via INTRAVENOUS

## 2016-06-20 ENCOUNTER — Ambulatory Visit (INDEPENDENT_AMBULATORY_CARE_PROVIDER_SITE_OTHER): Payer: Medicaid Other | Admitting: Orthopaedic Surgery

## 2016-06-20 ENCOUNTER — Encounter (INDEPENDENT_AMBULATORY_CARE_PROVIDER_SITE_OTHER): Payer: Self-pay | Admitting: Orthopaedic Surgery

## 2016-06-20 ENCOUNTER — Ambulatory Visit (INDEPENDENT_AMBULATORY_CARE_PROVIDER_SITE_OTHER): Payer: Self-pay | Admitting: Orthopaedic Surgery

## 2016-06-20 DIAGNOSIS — G8929 Other chronic pain: Secondary | ICD-10-CM

## 2016-06-20 DIAGNOSIS — M545 Low back pain, unspecified: Secondary | ICD-10-CM

## 2016-06-20 DIAGNOSIS — M25551 Pain in right hip: Secondary | ICD-10-CM

## 2016-06-20 NOTE — Progress Notes (Signed)
Office Visit Note   Patient: [name] [name]           Date of Birth: 01/12/1986           MRN: [id number] Visit Date: 06/20/2016              Requested by: Redmond School, MD [address] [place], [place] [address] PCP: [name]., MD   Assessment & Plan: Visit Diagnoses:  1. Pain of right hip joint   2. Chronic midline low back pain without sciatica     Plan: [name] [name] is 30 years old and is here in the office for evaluation of right hip pain. He has had increasing pain over the last 1-2 months without history of injury or trauma. He does have a history of low back pain with prior injections. However he feels the that this is not related to his back. The pain is localized in the right groin with referred "burning" into the right thigh. 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 He was told at one time that he might need hip replacements by the time he is about 21. Dr. Gerarda Fraction refers him for further evaluation. He has been on chronic Percocet and gabapentin.  I think his symptoms are related to his right hip. From a diagnostic and therapeutic standpoint I think an intra-articular cortisone injection would be worthwhile. I discussed this in detail with [name] [name] and we'll see him back after we've obtained the the injection  Follow-Up Instructions: Return for right hip mpain F/U.   Orders:  Orders Placed This Encounter  Procedures  . Ambulatory referral to Interventional Radiology   No orders of the defined types were placed in this encounter.     Procedures: No procedures performed   Clinical Data: No additional findings.   Subjective: No chief complaint on file.   Pt has history of leg Perth's disease. Right hip pain for 5 months MRI performed and results in room. Labral tear and lumbar disc protrusion Pt alternates percocet and tramadol    Review of Systems  Constitutional: Negative.   HENT: Negative.   Eyes: Negative.   Respiratory:  Negative.   Cardiovascular: Negative.   Gastrointestinal: Negative.   Endocrine: Negative.   Genitourinary: Negative.   Musculoskeletal: Negative.   Skin: Negative.   Allergic/Immunologic: Negative.   Neurological: Negative.   Hematological: Negative.   Psychiatric/Behavioral: Negative.      Objective: Vital Signs: BP 130/80   Pulse 64   Ht 5\' 9"  (1.753 m)   Wt 180 lb (81.6 kg)   BMI 26.58 kg/m   Physical Exam  Ortho Exam exam he has pain with internal and external rotation of his right hip. He experiences a "click" the same motion. There is no back pain or localized pain on the lateral aspect of his hip. He denies knee pain.  Specialty Comments:  No specialty comments available.  Imaging: No results found.[name] ordered an MRI scan of his lumbar spine I have reviewed these on the PACS system. 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 PMFS History:. Patient Active Problem List   Diagnosis Date Noted  . Ileus ([place]) 02/09/2015  . Hypotension 02/06/2015  . Nausea vomiting and diarrhea 02/06/2015  . Partial small bowel obstruction 10/27/2013  . Abdominal pain 10/27/2013   Past Medical History:  Diagnosis Date  . History of Legg-Calve-Perthes disease   . Small bowel obstruction  Family History  Problem Relation Age of Onset  . Colon cancer      grandmother    Past Surgical History:  Procedure Laterality Date  . ABDOMINAL SURGERY    . CHOLECYSTECTOMY    . HERNIA REPAIR     Social History   Occupational History  . Not on file.   Social History Main Topics  . Smoking status: Current Every Day Smoker    Packs/day: 0.00    Years: 7.00    Types: Cigarettes  . Smokeless tobacco: Never Used  . Alcohol use 0.0 oz/week  . Drug use: No  . Sexual activity: Not on file

## 2016-06-26 ENCOUNTER — Other Ambulatory Visit: Payer: Self-pay | Admitting: *Deleted

## 2016-06-26 DIAGNOSIS — M25551 Pain in right hip: Secondary | ICD-10-CM

## 2016-07-27 ENCOUNTER — Telehealth (INDEPENDENT_AMBULATORY_CARE_PROVIDER_SITE_OTHER): Payer: Self-pay | Admitting: Orthopaedic Surgery

## 2016-07-27 NOTE — Telephone Encounter (Signed)
Called [place] office and Baldo Ash will schedule at [place].

## 2016-07-27 NOTE — Telephone Encounter (Signed)
Patient's wife called about an injection that the patient was supposed to be scheduled for and they have not heard anything yet. Please advise.

## 2016-08-13 HISTORY — PX: HIP ARTHROSCOPY: SUR88

## 2017-07-17 ENCOUNTER — Emergency Department (HOSPITAL_COMMUNITY): Payer: 59

## 2017-07-17 ENCOUNTER — Other Ambulatory Visit: Payer: Self-pay

## 2017-07-17 ENCOUNTER — Encounter (HOSPITAL_COMMUNITY): Payer: Self-pay | Admitting: Emergency Medicine

## 2017-07-17 ENCOUNTER — Inpatient Hospital Stay (HOSPITAL_COMMUNITY)
Admission: EM | Admit: 2017-07-17 | Discharge: 2017-07-20 | DRG: 391 | Disposition: A | Discharge status: Home / Self Care | Payer: 59 | Attending: Internal Medicine | Admitting: Internal Medicine

## 2017-07-17 DIAGNOSIS — R197 Diarrhea, unspecified: Secondary | ICD-10-CM

## 2017-07-17 DIAGNOSIS — K529 Noninfective gastroenteritis and colitis, unspecified: Principal | ICD-10-CM | POA: Diagnosis present

## 2017-07-17 DIAGNOSIS — R112 Nausea with vomiting, unspecified: Secondary | ICD-10-CM | POA: Diagnosis present

## 2017-07-17 DIAGNOSIS — R933 Abnormal findings on diagnostic imaging of other parts of digestive tract: Secondary | ICD-10-CM | POA: Diagnosis not present

## 2017-07-17 DIAGNOSIS — R1013 Epigastric pain: Secondary | ICD-10-CM

## 2017-07-17 DIAGNOSIS — Z800 Family history of malignant neoplasm of digestive organs: Secondary | ICD-10-CM

## 2017-07-17 DIAGNOSIS — E860 Dehydration: Secondary | ICD-10-CM | POA: Diagnosis not present

## 2017-07-17 DIAGNOSIS — F17210 Nicotine dependence, cigarettes, uncomplicated: Secondary | ICD-10-CM | POA: Diagnosis present

## 2017-07-17 DIAGNOSIS — Z720 Tobacco use: Secondary | ICD-10-CM | POA: Diagnosis not present

## 2017-07-17 DIAGNOSIS — Z79899 Other long term (current) drug therapy: Secondary | ICD-10-CM | POA: Diagnosis not present

## 2017-07-17 DIAGNOSIS — K631 Perforation of intestine (nontraumatic): Secondary | ICD-10-CM

## 2017-07-17 DIAGNOSIS — K561 Intussusception: Secondary | ICD-10-CM | POA: Diagnosis present

## 2017-07-17 DIAGNOSIS — E876 Hypokalemia: Secondary | ICD-10-CM | POA: Diagnosis not present

## 2017-07-17 DIAGNOSIS — G8929 Other chronic pain: Secondary | ICD-10-CM | POA: Diagnosis present

## 2017-07-17 DIAGNOSIS — R109 Unspecified abdominal pain: Secondary | ICD-10-CM | POA: Diagnosis not present

## 2017-07-17 LAB — COMPREHENSIVE METABOLIC PANEL
ALBUMIN: 4.6 g/dL (ref 3.5–5.0)
ALT: 18 U/L (ref 17–63)
ANION GAP: 8 (ref 5–15)
AST: 22 U/L (ref 15–41)
Alkaline Phosphatase: 51 U/L (ref 38–126)
BILIRUBIN TOTAL: 1.3 mg/dL — AB (ref 0.3–1.2)
BUN: 11 mg/dL (ref 6–20)
CO2: 25 mmol/L (ref 22–32)
Calcium: 9.4 mg/dL (ref 8.9–10.3)
Chloride: 106 mmol/L (ref 101–111)
Creatinine, Ser: 0.9 mg/dL (ref 0.61–1.24)
GFR calc Af Amer: >60 mL/min (ref >60)
GFR calc non Af Amer: >60 mL/min (ref >60)
GLUCOSE: 107 mg/dL — AB (ref 65–99)
POTASSIUM: 3.5 mmol/L (ref 3.5–5.1)
SODIUM: 139 mmol/L (ref 135–145)
TOTAL PROTEIN: 7.9 g/dL (ref 6.5–8.1)

## 2017-07-17 LAB — CBC WITH DIFFERENTIAL/PLATELET
Basophils Absolute: 0 10*3/uL (ref 0.0–0.1)
Basophils Relative: 0 %
EOS PCT: 1 %
Eosinophils Absolute: 0 10*3/uL (ref 0.0–0.7)
HCT: 45.1 % (ref 39.0–52.0)
Hemoglobin: 15.1 g/dL (ref 13.0–17.0)
LYMPHS PCT: 19 %
Lymphs Abs: 1.4 10*3/uL (ref 0.7–4.0)
MCH: 30 pg (ref 26.0–34.0)
MCHC: 33.5 g/dL (ref 30.0–36.0)
MCV: 89.5 fL (ref 78.0–100.0)
MONO ABS: 0.7 10*3/uL (ref 0.1–1.0)
MONOS PCT: 9 %
Neutro Abs: 5.5 10*3/uL (ref 1.7–7.7)
Neutrophils Relative %: 71 %
Platelets: 327 10*3/uL (ref 150–400)
RBC: 5.04 MIL/uL (ref 4.22–5.81)
RDW: 13 % (ref 11.5–15.5)
WBC: 7.8 10*3/uL (ref 4.0–10.5)

## 2017-07-17 LAB — MRSA PCR SCREENING: MRSA by PCR: POSITIVE — AB

## 2017-07-17 LAB — MAGNESIUM: MAGNESIUM: 1.9 mg/dL (ref 1.7–2.4)

## 2017-07-17 LAB — LIPASE, BLOOD: Lipase: 31 U/L (ref 11–51)

## 2017-07-17 MED ORDER — ONDANSETRON HCL 4 MG/2ML IJ SOLN
4.0000 mg | Freq: Once | INTRAMUSCULAR | Status: AC
  Administered 2017-07-17: 4 mg via INTRAVENOUS
  Filled 2017-07-17: qty 2

## 2017-07-17 MED ORDER — HYDROMORPHONE HCL 1 MG/ML IJ SOLN
INTRAMUSCULAR | Status: AC
  Filled 2017-07-17: qty 1

## 2017-07-17 MED ORDER — SODIUM CHLORIDE 0.9 % IV SOLN
INTRAVENOUS | Status: DC
  Administered 2017-07-17 – 2017-07-19 (×7): via INTRAVENOUS

## 2017-07-17 MED ORDER — HYDROMORPHONE HCL 1 MG/ML IJ SOLN
1.0000 mg | Freq: Once | INTRAMUSCULAR | Status: AC
  Administered 2017-07-17: 1 mg via INTRAVENOUS

## 2017-07-17 MED ORDER — ONDANSETRON HCL 4 MG PO TABS: 4.0000 mg | ORAL_TABLET | Freq: Four times a day (QID) | ORAL | Status: DC | PRN

## 2017-07-17 MED ORDER — SODIUM CHLORIDE 0.9 % IV BOLUS (SEPSIS)
1000.0000 mL | Freq: Once | INTRAVENOUS | Status: AC
  Administered 2017-07-17: 1000 mL via INTRAVENOUS

## 2017-07-17 MED ORDER — DEXTROSE 5 % IV SOLN
2.0000 g | Freq: Once | INTRAVENOUS | Status: AC
  Administered 2017-07-17: 2 g via INTRAVENOUS
  Filled 2017-07-17: qty 2

## 2017-07-17 MED ORDER — HYDROMORPHONE HCL 1 MG/ML IJ SOLN
1.0000 mg | INTRAMUSCULAR | Status: DC | PRN
  Administered 2017-07-17 (×3): 1 mg via INTRAVENOUS
  Filled 2017-07-17 (×3): qty 1

## 2017-07-17 MED ORDER — IOPAMIDOL (ISOVUE-300) INJECTION 61%
100.0000 mL | Freq: Once | INTRAVENOUS | Status: AC | PRN
  Administered 2017-07-17: 100 mL via INTRAVENOUS

## 2017-07-17 MED ORDER — NICOTINE 14 MG/24HR TD PT24
14.0000 mg | MEDICATED_PATCH | Freq: Every day | TRANSDERMAL | Status: DC
  Administered 2017-07-17 – 2017-07-19 (×3): 14 mg via TRANSDERMAL
  Filled 2017-07-17 (×4): qty 1

## 2017-07-17 MED ORDER — METRONIDAZOLE IN NACL 5-0.79 MG/ML-% IV SOLN
500.0000 mg | Freq: Three times a day (TID) | INTRAVENOUS | Status: DC
  Administered 2017-07-17 – 2017-07-20 (×9): 500 mg via INTRAVENOUS
  Filled 2017-07-17 (×9): qty 100

## 2017-07-17 MED ORDER — DEXTROSE 5 % IV SOLN
2.0000 g | INTRAVENOUS | Status: DC
  Administered 2017-07-18 – 2017-07-20 (×3): 2 g via INTRAVENOUS
  Filled 2017-07-17 (×5): qty 2

## 2017-07-17 MED ORDER — MORPHINE SULFATE (PF) 2 MG/ML IV SOLN
2.0000 mg | INTRAVENOUS | Status: DC | PRN
  Administered 2017-07-17 – 2017-07-18 (×7): 2 mg via INTRAVENOUS
  Filled 2017-07-17 (×7): qty 1

## 2017-07-17 MED ORDER — FAMOTIDINE IN NACL 20-0.9 MG/50ML-% IV SOLN
20.0000 mg | Freq: Two times a day (BID) | INTRAVENOUS | Status: DC
  Administered 2017-07-17 (×2): 20 mg via INTRAVENOUS
  Filled 2017-07-17 (×2): qty 50

## 2017-07-17 MED ORDER — METRONIDAZOLE IN NACL 5-0.79 MG/ML-% IV SOLN
500.0000 mg | Freq: Once | INTRAVENOUS | Status: AC
  Administered 2017-07-17: 500 mg via INTRAVENOUS
  Filled 2017-07-17: qty 100

## 2017-07-17 MED ORDER — PROCHLORPERAZINE EDISYLATE 5 MG/ML IJ SOLN
5.0000 mg | Freq: Four times a day (QID) | INTRAMUSCULAR | Status: DC | PRN
  Administered 2017-07-17 – 2017-07-20 (×5): 5 mg via INTRAVENOUS
  Filled 2017-07-17 (×5): qty 2

## 2017-07-17 MED ORDER — FENTANYL CITRATE (PF) 100 MCG/2ML IJ SOLN
50.0000 ug | Freq: Once | INTRAMUSCULAR | Status: AC
  Administered 2017-07-17: 50 ug via INTRAVENOUS
  Filled 2017-07-17: qty 2

## 2017-07-17 MED ORDER — ONDANSETRON HCL 4 MG/2ML IJ SOLN
4.0000 mg | Freq: Four times a day (QID) | INTRAMUSCULAR | Status: DC | PRN
  Administered 2017-07-17 – 2017-07-19 (×4): 4 mg via INTRAVENOUS
  Filled 2017-07-17 (×4): qty 2

## 2017-07-17 NOTE — ED Triage Notes (Signed)
Pt c/o upper abd pain with nausea. Pt states he tried to use the restroom with no results.

## 2017-07-17 NOTE — ED Provider Notes (Signed)
[facility] EMERGENCY DEPARTMENT Provider Note   CSN: [id number] Arrival date & time: 07/17/17  0228  Time seen 02:40 AM   History   Chief Complaint Chief Complaint  Patient presents with  . Abdominal Pain    HPI [name] [name] is a 31 y.o. male.  HPI patient reports during the night on December 2 he started having upper abdominal pain that is sharp.  He states it comes and goes but today it has been constant and it waxes and wanes.  He denies nausea or vomiting but states he is having diarrhea.  It started yesterday and today he had about 10 episodes of watery diarrhea.  He also states he has a diffuse headache that he describes as sharp and achy and states it makes him feel like he needs to drink anything he is dehydrated.  He denies fever, feeling dizzy or lightheaded.  He states he is having decreased urinary output.  He states drinking or trying to eat makes his abdominal pain worse, walking makes it feel a little bit better.  He states it feels like when he had small bowel obstruction in the past.  He states he has had a cholecystectomy and an umbilical hernia repair.  He states he feels like his abdomen is bloated.  Patient's wife is also in the ED with a different complaint.  PCP Redmond School, MD   Past Medical History:  Diagnosis Date  . History of Legg-Calve-Perthes disease   . Small bowel obstruction [facility])     Patient Active Problem List   Diagnosis Date Noted  . Ileus ([place]) 02/09/2015  . Hypotension 02/06/2015  . Nausea vomiting and diarrhea 02/06/2015  . Partial small bowel obstruction ([place]) 10/27/2013  . Abdominal pain 10/27/2013    Past Surgical History:  Procedure Laterality Date  . ABDOMINAL SURGERY    . CHOLECYSTECTOMY    . HERNIA REPAIR         Home Medications    Prior to Admission medications   Medication Sig Start Date End Date Taking? Authorizing Provider  gabapentin (NEURONTIN) 300 MG capsule Take 300 mg by mouth 3 (three) times daily.     [provider]  nicotine (NICODERM CQ - DOSED IN MG/24 HOURS) 14 mg/24hr patch Place 1 patch (14 mg total) onto the skin daily. Patient not taking: Reported on 06/20/2016 10/31/13   Nita Sells, MD  omeprazole (PRILOSEC) 20 MG capsule Take 1 capsule (20 mg total) by mouth daily. Patient not taking: Reported on 06/20/2016 10/06/15   Leo Grosser, MD  oxyCODONE-acetaminophen (PERCOCET) 7.5-325 MG tablet Take 1 tablet by mouth every 4 (four) hours as needed for severe pain.    [provider]  sucralfate (CARAFATE) 1 GM/10ML suspension Take 10 mLs (1 g total) by mouth 4 (four) times daily -  with meals and at bedtime. Patient not taking: Reported on 06/20/2016 10/06/15   Leo Grosser, MD    Family History Family History  Problem Relation Age of Onset  . Colon cancer Unknown        grandmother    Social History Social History   Tobacco Use  . Smoking status: Current Every Day Smoker    Packs/day: 0.00    Years: 7.00    Pack years: 0.00    Types: Cigarettes  . Smokeless tobacco: Never Used  Substance Use Topics  . Alcohol use: Yes    Alcohol/week: 0.0 oz  . Drug use: No  employed Smokes 1 ppd Drinks rarely  Allergies   Patient has no known allergies.   Review of Systems Review of Systems  All other systems reviewed and are negative.    Physical Exam Updated Vital Signs BP 136/61 (BP Location: Left Arm)   Pulse 79   Temp 97.9 F (36.6 C) (Oral)   Resp 18   Ht 5\' 10"  (1.778 m)   Wt 90.7 kg (200 lb)   SpO2 98%   BMI 28.70 kg/m   Vital signs normal    Physical Exam  Constitutional: He is oriented to person, place, and time. He appears well-developed and well-nourished.  Non-toxic appearance. He does not appear ill. He appears distressed.  Laying on his left side holding his abdomen  HENT:  Head: Normocephalic and atraumatic.  Right Ear: External ear normal.  Left Ear: External ear normal.  Nose: Nose normal. No mucosal edema or  rhinorrhea.  Mouth/Throat: Mucous membranes are dry. No dental abscesses or uvula swelling.  Eyes: Conjunctivae and EOM are normal. Pupils are equal, round, and reactive to light.  Neck: Normal range of motion and full passive range of motion without pain. Neck supple.  Cardiovascular: Normal rate, regular rhythm and normal heart sounds. Exam reveals no gallop and no friction rub.  No murmur heard. Pulmonary/Chest: Effort normal and breath sounds normal. No respiratory distress. He has no wheezes. He has no rhonchi. He has no rales. He exhibits no tenderness and no crepitus.  Abdominal: Soft. Normal appearance and bowel sounds are normal. He exhibits no distension. There is tenderness. There is no rebound and no guarding.    Patient has diffuse abdominal pain but it is worse in the epigastric area.  Musculoskeletal: Normal range of motion. He exhibits no edema or tenderness.  Moves all extremities well.   Neurological: He is alert and oriented to person, place, and time. He has normal strength. No cranial nerve deficit.  Skin: Skin is warm, dry and intact. No rash noted. No erythema. No pallor.  Psychiatric: He has a normal mood and affect. His speech is normal and behavior is normal. His mood appears not anxious.  Nursing note and vitals reviewed.    ED Treatments / Results  Labs (all labs ordered are listed, but only abnormal results are displayed) Results for orders placed or performed during the hospital encounter of 07/17/17  Comprehensive metabolic panel  Result Value Ref Range   Sodium 139 135 - 145 mmol/L   Potassium 3.5 3.5 - 5.1 mmol/L   Chloride 106 101 - 111 mmol/L   CO2 25 22 - 32 mmol/L   Glucose, Bld 107 (H) 65 - 99 mg/dL   BUN 11 6 - 20 mg/dL   Creatinine, Ser 0.90 0.61 - 1.24 mg/dL   Calcium 9.4 8.9 - 10.3 mg/dL   Total Protein 7.9 6.5 - 8.1 g/dL   Albumin 4.6 3.5 - 5.0 g/dL   AST 22 15 - 41 U/L   ALT 18 17 - 63 U/L   Alkaline Phosphatase 51 38 - 126 U/L    Total Bilirubin 1.3 (H) 0.3 - 1.2 mg/dL   GFR calc non Af Amer >60 >60 mL/min   GFR calc Af Amer >60 >60 mL/min   Anion gap 8 5 - 15  CBC with Differential  Result Value Ref Range   WBC 7.8 4.0 - 10.5 K/uL   RBC 5.04 4.22 - 5.81 MIL/uL   Hemoglobin 15.1 13.0 - 17.0 g/dL   HCT 45.1 39.0 - 52.0 %   MCV 89.5 78.0 -  100.0 fL   MCH 30.0 26.0 - 34.0 pg   MCHC 33.5 30.0 - 36.0 g/dL   RDW 13.0 11.5 - 15.5 %   Platelets 327 150 - 400 K/uL   Neutrophils Relative % 71 %   Neutro Abs 5.5 1.7 - 7.7 K/uL   Lymphocytes Relative 19 %   Lymphs Abs 1.4 0.7 - 4.0 K/uL   Monocytes Relative 9 %   Monocytes Absolute 0.7 0.1 - 1.0 K/uL   Eosinophils Relative 1 %   Eosinophils Absolute 0.0 0.0 - 0.7 K/uL   Basophils Relative 0 %   Basophils Absolute 0.0 0.0 - 0.1 K/uL  Lipase, blood  Result Value Ref Range   Lipase 31 11 - 51 U/L   Laboratory interpretation all normal     EKG  EKG Interpretation None       Radiology Ct Abdomen Pelvis W Contrast  Result Date: 07/17/2017 CLINICAL DATA:  31 y/o M; upper abdominal pain with nausea. History of small bowel obstruction. EXAM: CT ABDOMEN AND PELVIS WITH CONTRAST TECHNIQUE: Multidetector CT imaging of the abdomen and pelvis was performed using the standard protocol following bolus administration of intravenous contrast. CONTRAST:  160mL ISOVUE-300 IOPAMIDOL (ISOVUE-300) INJECTION 61% COMPARISON:  10/06/2015 CT of abdomen and pelvis. FINDINGS: Lower chest: No acute abnormality. Hepatobiliary: No focal liver abnormality is seen. Status post cholecystectomy. No biliary dilatation. Pancreas: Unremarkable. No pancreatic ductal dilatation or surrounding inflammatory changes. Spleen: Normal in size without focal abnormality. Adrenals/Urinary Tract: Adrenal glands are unremarkable. Kidneys are normal, without renal calculi, focal lesion, or hydronephrosis. Bladder is unremarkable. Stomach/Bowel: Dilatation of the duodenum and proximal jejunum with moderate wall  thickening of proximal jejunum. Small focus of bowel wall pneumatosis (series 5, image 35), no pneumoperitoneum. Small bowel dilatation smoothly tapers to normal caliber within downstream distal jejunum and ileum demonstrate normal wall thickening. Normal colon. Normal appendix. Vascular/Lymphatic: No significant vascular findings are present. No enlarged abdominal or pelvic lymph nodes. Reproductive: Prostate is unremarkable. Other: No abdominal wall hernia or abnormality. No abdominopelvic ascites. Musculoskeletal: No acute or significant osseous findings. IMPRESSION: Duodenum and proximal jejunum moderate dilatation and wall thickening likely representing infectious/inflammatory enteritis. Small focus of bowel wall pneumatosis compatible with mucosal perforation, no pneumoperitoneum or abscess. Electronically Signed   By: Kristine Garbe M.D.   On: 07/17/2017 04:20   Dg Abdomen Acute W/chest  Result Date: 07/17/2017 CLINICAL DATA:  31 y/o  M; mid abdominal pain, diarrhea, nausea. EXAM: DG ABDOMEN ACUTE W/ 1V CHEST COMPARISON:  02/08/2015 abdomen radiographs FINDINGS: Dilated small bowel loops in left upper abdomen small bowel fluid levels. Right upper quadrant cholecystectomy clips. No radiopaque calculi or other significant radiographic abnormality is seen. Heart size and mediastinal contours are within normal limits. Both lungs are clear. IMPRESSION: Dilated loops of small bowel in left upper quadrant and small bowel fluid levels may represent obstruction or focal ileus. Electronically Signed   By: Kristine Garbe M.D.   On: 07/17/2017 03:43    Procedures Procedures (including critical care time)  Medications Ordered in ED Medications  cefTRIAXone (ROCEPHIN) 2 g in dextrose 5 % 50 mL IVPB (not administered)    And  metroNIDAZOLE (FLAGYL) IVPB 500 mg (not administered)  sodium chloride 0.9 % bolus 1,000 mL (1,000 mLs Intravenous New Bag/Given 07/17/17 0311)  sodium chloride 0.9 %  bolus 1,000 mL (1,000 mLs Intravenous New Bag/Given 07/17/17 0311)  fentaNYL (SUBLIMAZE) injection 50 mcg (50 mcg Intravenous Given 07/17/17 0311)  ondansetron (ZOFRAN) injection 4 mg (4  mg Intravenous Given 07/17/17 0311)  iopamidol (ISOVUE-300) 61 % injection 100 mL (100 mLs Intravenous Contrast Given 07/17/17 0351)  fentaNYL (SUBLIMAZE) injection 50 mcg (50 mcg Intravenous Given 07/17/17 0422)  HYDROmorphone (DILAUDID) injection 1 mg (1 mg Intravenous Given 07/17/17 0428)     Initial Impression / Assessment and Plan / ED Course  I have reviewed the triage vital signs and the nursing notes.  Pertinent labs & imaging results that were available during my care of the patient were reviewed by me and considered in my medical decision making (see chart for details).     Patient was given IV fluids for his dehydration and IV pain and nausea medication.  3:20 AM I reviewed his acute abdominal series and he appears to have some evidence of partial small bowel obstruction, CT of the abdomen and pelvis was ordered.  Recheck 4:20 AM patient states he still having a lot of pain.  He was given more fentanyl and Dilaudid IV.  4:30 AM after reviewing the radiologist's CT report he was started on IV antibiotics, Rocephin and Flagyl.  I am going to talk to the surgeon but most likely he will be admitted by hospitalist.  04:39 AM Dr. Constance Haw, surgeon states to keep him n.p.o. and continue IV fluids and IV antibiotics.  She will see him this morning when she makes rounds. Have hospitalist admit.   Patient was given the results of his CT scan, he is relieved he does not have to have an NG tube for bowel obstruction.  5 AM Dr. Olevia Bowens, hospitalist will admit   Review of the [place] shows patient was getting #60  Oxycodone 10/325 about every 3 weeks from April through July 6, then #40 on July 27.  There are no more prescriptions since that prescription on July 27.  Before that he had been on  number 120 tablets every 3 weeks November 2017 through March 2018    Final Clinical Impressions(s) / ED Diagnoses   Final diagnoses:  Diarrhea of presumed infectious origin  Dehydration  Epigastric abdominal pain  Perforation of intestine [facility])    Plan admission  Rolland Porter, MD, Barbette Or, MD 07/17/17 [phone]

## 2017-07-17 NOTE — Consult Note (Signed)
[name] [name]  Reason for Consult: Enteritis, Pneumatosis  Referring Physician: Dr. Tomi Bamberger and Dr. Clementeen Graham   Chief Complaint    Abdominal Pain      [name] [name] is a 31 y.o. male.  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 [name] [name] underwent a CT scan that demonstrates a thickened and dilated duodenum and jejunum with pneumatosis in the submucosa.  [name] [name] was otherwise hemodynamically stable with a normal WBC and no signs of any bowel perforation . [name] [name] has been in the hospital several times in the past with reports of either a enteritis versus a partial obstruction with CT scans with similar findings with either duodenal or jejunal thickening and a partial obstruction related to this.  [name] [name] has had a cholecystectomy in the past and an umbilical hernia which was done emergently due to it "rupturing" and [name] [name] reports was done with mesh.  [name] [name] otherwise has not had any major intraabdominal procedures to account for any scarring.  [name] [name] describes a constant pain that is gnawing and chronic. [name] [name] has had to use pain medications in the past due to this congential hip disease but recently underwent a surgery at [facility] with some relief of his chronic pain.  [name] [name] reports that since stopping some of the narcotics [name] [name] has noticed this constant pain more, and that [name] [name] also has pain into his epigastric area and chest at times. [name] [name] has diarrhea chronically, and reports a history of some BRBPR and melena.  [name] [name] has had hematemesis a few times. [name] [name] has never undergone an endoscopy.    Currently [name] [name] is still having some pain. [name] [name] is frustrated because [name] [name] has been living with this for some time now, and when it flares up [name] [name] has episodes like this one.  The last time [name] [name] was in the hospital [name] [name] was actually in the ICU and significantly sicker.   [name] [name] has used zantac in the past without much difference in his pain.   Past Medical History:  Diagnosis  Date  . History of Legg-Calve-Perthes disease   . Small bowel obstruction [facility])     Past Surgical History:  Procedure Laterality Date  . ABDOMINAL SURGERY    . CHOLECYSTECTOMY    . HERNIA REPAIR      Family History  Problem Relation Age of Onset  . Colon cancer Unknown        grandmother  . Colon cancer Paternal Grandmother     Social History   Tobacco Use  . Smoking status: Current Every Day Smoker    Packs/day: 0.00    Years: 7.00    Pack years: 0.00    Types: Cigarettes  . Smokeless tobacco: Never Used  Substance Use Topics  . Alcohol use: Yes    Alcohol/week: 0.0 oz  . Drug use: No    Medications:  I have reviewed the [name] [name]'s current medications. Prior to Admission:  Medications Prior to Admission  Medication Sig Dispense Refill Last Dose  . nicotine (NICODERM CQ - DOSED IN MG/24 HOURS) 14 mg/24hr patch Place 1 patch (14 mg total) onto the skin daily. ([name] [name] not taking: Reported on 06/20/2016) 28 patch 0 Not Taking at Unknown time  . omeprazole (PRILOSEC) 20 MG capsule Take 1 capsule (20 mg total) by mouth daily. ([name] [name] not taking: Reported on 06/20/2016) 30 capsule 0 Not Taking at Unknown time  . oxyCODONE-acetaminophen (PERCOCET) 7.5-325 MG tablet Take 1 tablet by mouth every  4 (four) hours as needed for severe pain.   Not Taking at Unknown time  . sucralfate (CARAFATE) 1 GM/10ML suspension Take 10 mLs (1 g total) by mouth 4 (four) times daily -  with meals and at bedtime. ([name] [name] not taking: Reported on 06/20/2016) 420 mL 0 Not Taking at Unknown time   Scheduled: . nicotine  14 mg Transdermal Daily   Continuous: . sodium chloride 125 mL/hr at 07/17/17 0618  . [START ON 07/18/2017] cefTRIAXone (ROCEPHIN)  IV    . famotidine (PEPCID) IV Stopped (07/17/17 1020)  . metronidazole     KGU:RKYHCWCBJSEGB (DILAUDID) injection, ondansetron **OR** ondansetron (ZOFRAN) IV  No Known Allergies  ROS:  A comprehensive review of systems was negative except for:  Gastrointestinal: positive for abdominal pain, diarrhea, melena, nausea and vomiting Musculoskeletal: positive for chronic back/ hip pain  Blood pressure 108/64, pulse 63, temperature 98 F (36.7 C), temperature source Oral, resp. rate 17, height 5' 10"  (1.778 m), weight 198 lb 12.8 oz (90.2 kg), SpO2 97 %. Physical Exam  Constitutional: [name] [name] is oriented to person, place, and time and well-developed, well-nourished, and in no distress.  HENT:  Head: Normocephalic.  Eyes: Pupils are equal, round, and reactive to light.  Neck: Normal range of motion.  Cardiovascular: Normal rate and regular rhythm.  Pulmonary/Chest: Effort normal and breath sounds normal.  Abdominal: Soft. [name] [name] exhibits no distension. There is tenderness. There is no rebound and no guarding.  Left upper quadrant tenderness, some guarding in this area but focal otherwise no rebound or guarding  Musculoskeletal: Normal range of motion.  Neurological: [name] [name] is alert and oriented to person, place, and time.  Skin: Skin is dry.  Psychiatric: Mood, memory, affect and judgment normal.  Vitals reviewed.  Results: Results for orders placed or performed during the hospital encounter of 07/17/17 (from the past 48 hour(s))  Comprehensive metabolic panel     Status: Abnormal   Collection Time: 07/17/17  3:00 AM  Result Value Ref Range   Sodium 139 135 - 145 mmol/L   Potassium 3.5 3.5 - 5.1 mmol/L   Chloride 106 101 - 111 mmol/L   CO2 25 22 - 32 mmol/L   Glucose, Bld 107 (H) 65 - 99 mg/dL   BUN 11 6 - 20 mg/dL   Creatinine, Ser 0.90 0.61 - 1.24 mg/dL   Calcium 9.4 8.9 - 10.3 mg/dL   Total Protein 7.9 6.5 - 8.1 g/dL   Albumin 4.6 3.5 - 5.0 g/dL   AST 22 15 - 41 U/L   ALT 18 17 - 63 U/L   Alkaline Phosphatase 51 38 - 126 U/L   Total Bilirubin 1.3 (H) 0.3 - 1.2 mg/dL   GFR calc non Af Amer >60 >60 mL/min   GFR calc Af Amer >60 >60 mL/min    Comment: (NOTE) The eGFR has been calculated using the CKD EPI equation. This calculation  has not been validated in all clinical situations. eGFR's persistently <60 mL/min signify possible Chronic Kidney Disease.    Anion gap 8 5 - 15  CBC with Differential     Status: None   Collection Time: 07/17/17  3:00 AM  Result Value Ref Range   WBC 7.8 4.0 - 10.5 K/uL   RBC 5.04 4.22 - 5.81 MIL/uL   Hemoglobin 15.1 13.0 - 17.0 g/dL   HCT 45.1 39.0 - 52.0 %   MCV 89.5 78.0 - 100.0 fL   MCH 30.0 26.0 - 34.0 pg   MCHC 33.5 30.0 -  36.0 g/dL   RDW 13.0 11.5 - 15.5 %   Platelets 327 150 - 400 K/uL   Neutrophils Relative % 71 %   Neutro Abs 5.5 1.7 - 7.7 K/uL   Lymphocytes Relative 19 %   Lymphs Abs 1.4 0.7 - 4.0 K/uL   Monocytes Relative 9 %   Monocytes Absolute 0.7 0.1 - 1.0 K/uL   Eosinophils Relative 1 %   Eosinophils Absolute 0.0 0.0 - 0.7 K/uL   Basophils Relative 0 %   Basophils Absolute 0.0 0.0 - 0.1 K/uL  Lipase, blood     Status: None   Collection Time: 07/17/17  3:00 AM  Result Value Ref Range   Lipase 31 11 - 51 U/L  Magnesium     Status: None   Collection Time: 07/17/17  3:00 AM  Result Value Ref Range   Magnesium 1.9 1.7 - 2.4 mg/dL  MRSA PCR Screening     Status: Abnormal   Collection Time: 07/17/17  6:11 AM  Result Value Ref Range   MRSA by PCR POSITIVE (A) NEGATIVE    Comment:        The GeneXpert MRSA Assay (FDA approved for NASAL specimens only), is one component of a comprehensive MRSA colonization surveillance program. It is not intended to diagnose MRSA infection nor to guide or monitor treatment for MRSA infections. RESULT CALLED TO, READ BACK BY AND VERIFIED WITH: DILDY V. AT 0936A ON [id number] BY THOMPSON S.    Personally reviewed this CT and prior CT and reviewed with radiology (Dr. Kennon Rounds)- [name] [name] with focal areas of thickened small bowel at varying locations in the various CT scans, no real evidence of any scarring from a surgery and no obstructive pattern consistent with any type of adhesion type obstruction, [name] [name] has a thickened duodenum and  jejunum that tapers into nonthickened bowel, there is pneumatosis in the submucosa and no pneumoperitoneum, no free fluid   Ct Abdomen Pelvis W Contrast  Result Date: 07/17/2017 CLINICAL DATA:  31 y/o M; upper abdominal pain with nausea. History of small bowel obstruction. EXAM: CT ABDOMEN AND PELVIS WITH CONTRAST TECHNIQUE: Multidetector CT imaging of the abdomen and pelvis was performed using the standard protocol following bolus administration of intravenous contrast. CONTRAST:  174m ISOVUE-300 IOPAMIDOL (ISOVUE-300) INJECTION 61% COMPARISON:  10/06/2015 CT of abdomen and pelvis. FINDINGS: Lower chest: No acute abnormality. Hepatobiliary: No focal liver abnormality is seen. Status post cholecystectomy. No biliary dilatation. Pancreas: Unremarkable. No pancreatic ductal dilatation or surrounding inflammatory changes. Spleen: Normal in size without focal abnormality. Adrenals/Urinary Tract: Adrenal glands are unremarkable. Kidneys are normal, without renal calculi, focal lesion, or hydronephrosis. Bladder is unremarkable. Stomach/Bowel: Dilatation of the duodenum and proximal jejunum with moderate wall thickening of proximal jejunum. Small focus of bowel wall pneumatosis (series 5, image 35), no pneumoperitoneum. Small bowel dilatation smoothly tapers to normal caliber within downstream distal jejunum and ileum demonstrate normal wall thickening. Normal colon. Normal appendix. Vascular/Lymphatic: No significant vascular findings are present. No enlarged abdominal or pelvic lymph nodes. Reproductive: Prostate is unremarkable. Other: No abdominal wall hernia or abnormality. No abdominopelvic ascites. Musculoskeletal: No acute or significant osseous findings. IMPRESSION: Duodenum and proximal jejunum moderate dilatation and wall thickening likely representing infectious/inflammatory enteritis. Small focus of bowel wall pneumatosis compatible with mucosal perforation, no pneumoperitoneum or abscess. Electronically  Signed   By: LKristine GarbeM.D.   On: 07/17/2017 04:20   Dg Abdomen Acute W/chest  Result Date: 07/17/2017 CLINICAL DATA:  31y/o  M; mid abdominal  pain, diarrhea, nausea. EXAM: DG ABDOMEN ACUTE W/ 1V CHEST COMPARISON:  02/08/2015 abdomen radiographs FINDINGS: Dilated small bowel loops in left upper abdomen small bowel fluid levels. Right upper quadrant cholecystectomy clips. No radiopaque calculi or other significant radiographic abnormality is seen. Heart size and mediastinal contours are within normal limits. Both lungs are clear. IMPRESSION: Dilated loops of small bowel in left upper quadrant and small bowel fluid levels may represent obstruction or focal ileus. Electronically Signed   By: Kristine Garbe M.D.   On: 07/17/2017 03:43     Assessment & Plan:  [name] [name] is a 31 y.o. male with an unusual picture with chronic abdominal pain that has episodic flares with presentations to the hospital for question of enteritis versus pSBO without any clear evidence of an etiology. The [name] [name]'s intraabdominal surgeries do not sound that extensive to cause adhesions resulting in a SBO and on reviewing the CT there really isn't evidence of any thing like this with a transition point but rather thickened bowel focally that ends and returns to normal.  [name] [name] also has had some melena and hematemesis. [name] [name] has never undergone an endoscopy.    I am concerned [name] [name] has some type of inflammatory picture going on or even something with ulcerations of the mucosa that could have caused the pneumatosis.  Differential includes [place], Celiac, IBD? Irregular Crohn's.   I have discussed the case with Dr. Gala Romney and discussed getting and EGD. We have discussed the pneumatosis.  As far as a surgical intervention there is no signs that this is an adhesive problem and doing any biopsies of the bowel through the serosa would result in nonhealing and risk of perforation or worse issues. Discussed repeating  the CT to see if pneumatosis is resolved as it was likely transient.  I will let them review the chart and weigh in.    -No acute surgical intervention needed at this time, exam is reassuring and labs an vitals reassuring  -IVF, NPO for now -IV antibiotics given the thickening of the bowel but really no true signs of an infection just inflammation  -Ordered fasting gastrin level to see if there could be an elevation that would lead [place] to some differential  -Serial abdominal exams given the pneumatosis and call if worsening pain, fevers or hemodynamic changes   All questions were answered to the satisfaction of the [name] [name] and family.  Updated Dr. Clementeen Graham.   Virl Cagey 07/17/17 @ 11:00am

## 2017-07-17 NOTE — Consult Note (Signed)
Referring Provider: Louellen Molder, MD Primary Care Physician:  Redmond School, MD Primary Gastroenterologist:  Dr. Hildred Laser  Reason for Consultation: abnormal small bowel on CT (consult requested by Dr. Constance Haw)  HPI: [name] [name] is a 31 y.o. male who presented with acute on chronic epigastric pain associated with nausea. H/o partial small bowel obstruction versus enteritis on two other occasions, never required surgery (last admission 2015). Complains of constant abdominal pain, worse the last three months but states he has been off pain medication during this time which may have caused his pain to be more severe. Denies melena, occasional brbpr but rare. No heartburn or dysphagia. Sometimes abdominal pain is postprandial and at least 2-3 times per month it is severe enough that he has to lay down. Chronically has some loose stool.   CT A/P with contrast today showed dilatation of the duodenum and proximal jejunum with moderate wall thickening of the proximal jejunum.  Small focus of bowel wall pneumatosis, no pneumoperitoneum.  Small bowel dilatation smoothly tapers to normal caliber within downstream distal jejunum and ileum mistreating normal wall thickening.  CT back in 2017 without any abnormality seen in the small bowel.  CT in 2016 showed left upper quadrant several loops of small bowel that appeared increased in caliber measuring up to 2.8 cm.  There does not appear to be progression of enteric contrast material beyond these bowel loops.  Mid and distal small bowel loops appear normal.  Finding may reflect small bowel obstruction or focal ileus.  CT abdomen pelvis back in 2015 showed air-fluid levels within the small bowel and colon.  Mild small bowel dilatation in the mid right abdomen measuring up to 3.4 cm involving the proximal jejunum.  There was a kinked appearance of the small bowel loop which may be secondary to adhesions.  There was gastric distention.  Question partial small  bowel obstruction with ileus versus enterocolitis.  Labs this admission with normal lipase, LFTs, except for total bilirubin at 1.3.  Normal CBC.     Prior to Admission medications   Medication Sig Start Date End Date Taking? Authorizing Provider          None                       Current Facility-Administered Medications  Medication Dose Route Frequency Provider Last Rate Last Dose  . 0.9 %  sodium chloride infusion   Intravenous Continuous Reubin Milan, MD 125 mL/hr at 07/17/17 [phone]    . [START ON 07/18/2017] cefTRIAXone (ROCEPHIN) 2 g in dextrose 5 % 50 mL IVPB  2 g Intravenous Q24H Reubin Milan, MD      . famotidine (PEPCID) IVPB 20 mg premix  20 mg Intravenous Q12H Reubin Milan, MD   Stopped at 07/17/17 1020  . HYDROmorphone (DILAUDID) injection 1 mg  1 mg Intravenous Q4H PRN Reubin Milan, MD   1 mg at 07/17/17 1111  . metroNIDAZOLE (FLAGYL) IVPB 500 mg  500 mg Intravenous Q8H Reubin Milan, MD      . nicotine (NICODERM CQ - dosed in mg/24 hours) patch 14 mg  14 mg Transdermal Daily Dhungel, Nishant, MD   14 mg at 07/17/17 1010  . ondansetron (ZOFRAN) tablet 4 mg  4 mg Oral Q6H PRN Reubin Milan, MD       Or  . ondansetron [facility]) injection 4 mg  4 mg Intravenous Q6H PRN Reubin Milan, MD  Allergies as of 07/17/2017  . (No Known Allergies)    Past Medical History:  Diagnosis Date  . History of Legg-Calve-Perthes disease   . Small bowel obstruction [facility])     Past Surgical History:  Procedure Laterality Date  . ABDOMINAL SURGERY    . CHOLECYSTECTOMY    . HERNIA REPAIR      Family History  Problem Relation Age of Onset  . Colon cancer Unknown        grandmother  . Colon cancer Paternal Grandmother     Social History   Socioeconomic History  . Marital status: Married    Spouse name: Not on file  . Number of children: Not on file  . Years of education: Not on file  . Highest education level: Not on file   Social Needs  . Financial resource strain: Not on file  . Food insecurity - worry: Not on file  . Food insecurity - inability: Not on file  . Transportation needs - medical: Not on file  . Transportation needs - non-medical: Not on file  Occupational History  . Not on file  Tobacco Use  . Smoking status: Current Every Day Smoker    Packs/day: 0.00    Years: 7.00    Pack years: 0.00    Types: Cigarettes  . Smokeless tobacco: Never Used  Substance and Sexual Activity  . Alcohol use: Yes    Alcohol/week: 0.0 oz  . Drug use: No  . Sexual activity: Not on file  Other Topics Concern  . Not on file  Social History Narrative  . Not on file     ROS:  General: Negative for anorexia, weight loss, fever, chills, fatigue, weakness. Eyes: Negative for vision changes.  ENT: Negative for hoarseness, difficulty swallowing , nasal congestion. CV: Negative for chest pain, angina, palpitations, dyspnea on exertion, peripheral edema.  Respiratory: Negative for dyspnea at rest, dyspnea on exertion, cough, sputum, wheezing.  GI: See history of present illness. GU:  Negative for dysuria, hematuria, urinary incontinence, urinary frequency, nocturnal urination.  MS: Negative for joint pain, low back pain. H/o right hip pain but much improved since surgery Derm: Negative for rash or itching.  Neuro: Negative for weakness, abnormal sensation, seizure, frequent headaches, memory loss, confusion.  Psych: Negative for anxiety, depression, suicidal ideation, hallucinations.  Endo: Negative for unusual weight change.  Heme: Negative for bruising or bleeding. Allergy: Negative for rash or hives.       Physical Examination: Vital signs in last 24 hours: Temp:  [97.9 F (36.6 C)-98.3 F (36.8 C)] 98 F (36.7 C) (12/05 0600) Pulse Rate:  [63-79] 63 (12/05 0600) Resp:  [17-18] 17 (12/05 0430) BP: (108-140)/(61-65) 108/64 (12/05 0600) SpO2:  [97 %-98 %] 97 % (12/05 0600) Weight:  [198 lb 12.8 oz  (90.2 kg)-200 lb (90.7 kg)] 198 lb 12.8 oz (90.2 kg) (12/05 0600) Last BM Date: 07/16/17  General: Well-nourished, well-developed in no acute distress.  Head: Normocephalic, atraumatic.   Eyes: Conjunctiva pink, no icterus. Mouth: Oropharyngeal mucosa moist and pink , no lesions erythema or exudate. Neck: Supple without thyromegaly, masses, or lymphadenopathy.  Lungs: Clear to auscultation bilaterally.  Heart: Regular rate and rhythm, no murmurs rubs or gallops.  Abdomen: Bowel sounds are normal, moderate epigastric/LUQ tenderness, nondistended, no hepatosplenomegaly or masses, no abdominal bruits or    hernia , no rebound or guarding.   Rectal: not performed Extremities: No lower extremity edema, clubbing, deformity.  Neuro: Alert and oriented x 4 , grossly  normal neurologically.  Skin: Warm and dry, no rash or jaundice.   Psych: Alert and cooperative, normal mood and affect.        Intake/Output from previous day: 12/04 0701 - 12/05 0700 In: 2216.7 [I.V.:66.7; IV Piggyback:2150] Out: -  Intake/Output this shift: No intake/output data recorded.  Lab Results: CBC Recent Labs    07/17/17 0300  WBC 7.8  HGB 15.1  HCT 45.1  MCV 89.5  PLT 327   BMET Recent Labs    07/17/17 0300  NA 139  K 3.5  CL 106  CO2 25  GLUCOSE 107*  BUN 11  CREATININE 0.90  CALCIUM 9.4   LFT Recent Labs    07/17/17 0300  BILITOT 1.3*  ALKPHOS 51  AST 22  ALT 18  PROT 7.9  ALBUMIN 4.6    Lipase Recent Labs    07/17/17 0300  LIPASE 31    PT/INR No results for input(s): LABPROT, INR in the last 72 hours.    Imaging Studies: Ct Abdomen Pelvis W Contrast  Result Date: 07/17/2017 CLINICAL DATA:  31 y/o M; upper abdominal pain with nausea. History of small bowel obstruction. EXAM: CT ABDOMEN AND PELVIS WITH CONTRAST TECHNIQUE: Multidetector CT imaging of the abdomen and pelvis was performed using the standard protocol following bolus administration of intravenous contrast.  CONTRAST:  170mL ISOVUE-300 IOPAMIDOL (ISOVUE-300) INJECTION 61% COMPARISON:  10/06/2015 CT of abdomen and pelvis. FINDINGS: Lower chest: No acute abnormality. Hepatobiliary: No focal liver abnormality is seen. Status post cholecystectomy. No biliary dilatation. Pancreas: Unremarkable. No pancreatic ductal dilatation or surrounding inflammatory changes. Spleen: Normal in size without focal abnormality. Adrenals/Urinary Tract: Adrenal glands are unremarkable. Kidneys are normal, without renal calculi, focal lesion, or hydronephrosis. Bladder is unremarkable. Stomach/Bowel: Dilatation of the duodenum and proximal jejunum with moderate wall thickening of proximal jejunum. Small focus of bowel wall pneumatosis (series 5, image 35), no pneumoperitoneum. Small bowel dilatation smoothly tapers to normal caliber within downstream distal jejunum and ileum demonstrate normal wall thickening. Normal colon. Normal appendix. Vascular/Lymphatic: No significant vascular findings are present. No enlarged abdominal or pelvic lymph nodes. Reproductive: Prostate is unremarkable. Other: No abdominal wall hernia or abnormality. No abdominopelvic ascites. Musculoskeletal: No acute or significant osseous findings. IMPRESSION: Duodenum and proximal jejunum moderate dilatation and wall thickening likely representing infectious/inflammatory enteritis. Small focus of bowel wall pneumatosis compatible with mucosal perforation, no pneumoperitoneum or abscess. Electronically Signed   By: Kristine Garbe M.D.   On: 07/17/2017 04:20   Dg Abdomen Acute W/chest  Result Date: 07/17/2017 CLINICAL DATA:  31 y/o  M; mid abdominal pain, diarrhea, nausea. EXAM: DG ABDOMEN ACUTE W/ 1V CHEST COMPARISON:  02/08/2015 abdomen radiographs FINDINGS: Dilated small bowel loops in left upper abdomen small bowel fluid levels. Right upper quadrant cholecystectomy clips. No radiopaque calculi or other significant radiographic abnormality is seen. Heart  size and mediastinal contours are within normal limits. Both lungs are clear. IMPRESSION: Dilated loops of small bowel in left upper quadrant and small bowel fluid levels may represent obstruction or focal ileus. Electronically Signed   By: Kristine Garbe M.D.   On: 07/17/2017 03:43  [4 week]   Impression: 31 year old male with recurrent acute on chronic abdominal pain with abnormality seen on multiple imaging of the proximal small bowel as outlined above.  Currently with moderate dilatation with wall thickening of the duodenum.and proximal jejunum question infectious/inflammatory enteritis.  Small focus of bowel wall pneumatosis compatible with mucosal perforation no pneumoperitoneum or abscess.  Patient has been  evaluated by Dr. Constance Haw currently no indication for surgical intervention.  Given serial CTs with small bowel abnormalities would be concerned about acute on chronic small bowel process.  Plan: 1. Currently no need for urgent endoscopic evaluation.  Would recommend further elective evaluation possibly enteroscopy versus small bowel capsule study (patency capsule initially). 2. Agree with antibiotics empirically. 3. Reevaluate in the morning.  We would like to thank you for the opportunity to participate in the care of [facility].  Laureen Ochs. Bernarda Caffey [facility] [phone] 12/5/20187:22 PM       LOS: 0 days

## 2017-07-17 NOTE — Progress Notes (Signed)
LUQ tenderness focally, nondistended. No changes. Continue to monitor.  Curlene Labrum, MD [facility] [address] [place], [place] [address] [phone] (office)

## 2017-07-17 NOTE — ED Notes (Signed)
Report to Ebony Hail, RN [id number]

## 2017-07-17 NOTE — H&P (Addendum)
TRH H&P   Patient Demographics:    [name] [name], is a 31 y.o. male  MRN: [id number]   DOB - January 29, 1986  Admit Date - 07/17/2017  Outpatient Primary MD for the patient is Redmond School, MD  Referring MD: Dr Rolland Porter  Outpatient Specialists:   Patient coming from:HOME  Chief Complaint  Patient presents with  . Abdominal Pain      HPI:    [name] [name]  is a 31 y.o. male, With history of Umbilical Hernia repair and cholecystectomy About 12 years back,Previously on chronic narcotics presented to the ED with 4 day history of Upper abdominal pain mainly in the epigastric area. He reports about 10 episodes of watery diarrhea with occasional blood mixed stool but no mucus. He also reported 3 episodes of vomiting and feeling very dehydrated and having headaches.Denies any fevers or chills, dizziness, Chest pain, palpitations, shortness of breath, dysuria, joint pains or muscle aches.Denies any new medications.  In the ED vitals were stable. Blood work was unremarkable.CT of the abdomen and pelvis done showed moderate dilatation of duodenum and proximal jejunum with wall thickening suggestive of infectious versus inflammatory enteritis. Also showed a small focus of bowel wall pneumatosis with mucosal perforation without pneumoperitoneum or abscess.  Hospitalist consulted for admission to medical floor. General surgery consulted.    Review of systems:    In addition to the HPI above,  No Fever-chills, Headache +, No changes with Vision or hearing, No problems swallowing food or Liquids, No Chest pain, Cough or Shortness of Breath, Abdominal pain+++, nausea and vomiting++, Diarrhea+++ Occasional blood in stool, no blood in urine No dysuria, No new skin rashes or bruises, No new joints pains-aches,  Generalized weakness, tingling, numbness in any extremity, No recent weight gain or  loss, No polyuria, polydypsia or polyphagia, No significant Mental Stressors.    With Past History of the following :    Past Medical History:  Diagnosis Date  . History of Legg-Calve-Perthes disease   . Small bowel obstruction [facility])       Past Surgical History:  Procedure Laterality Date  . ABDOMINAL SURGERY    . CHOLECYSTECTOMY    . HERNIA REPAIR        Social History:     Social History   Tobacco Use  . Smoking status: Current Every Day Smoker    Packs/day: 0.00    Years: 7.00    Pack years: 0.00    Types: Cigarettes  . Smokeless tobacco: Never Used  Substance Use Topics  . Alcohol use: Yes    Alcohol/week: 0.0 oz     Lives - Home  Mobility - Independent     Family History :     Family History  Problem Relation Age of Onset  . Colon cancer Unknown        grandmother  Home Medications:   Prior to Admission medications   Medication Sig Start Date End Date Taking? Authorizing Provider  gabapentin (NEURONTIN) 300 MG capsule Take 300 mg by mouth 3 (three) times daily.    [provider]  nicotine (NICODERM CQ - DOSED IN MG/24 HOURS) 14 mg/24hr patch Place 1 patch (14 mg total) onto the skin daily. Patient not taking: Reported on 06/20/2016 10/31/13   Nita Sells, MD  omeprazole (PRILOSEC) 20 MG capsule Take 1 capsule (20 mg total) by mouth daily. Patient not taking: Reported on 06/20/2016 10/06/15   Leo Grosser, MD  oxyCODONE-acetaminophen (PERCOCET) 7.5-325 MG tablet Take 1 tablet by mouth every 4 (four) hours as needed for severe pain.    [provider]  sucralfate (CARAFATE) 1 GM/10ML suspension Take 10 mLs (1 g total) by mouth 4 (four) times daily -  with meals and at bedtime. Patient not taking: Reported on 06/20/2016 10/06/15   Leo Grosser, MD     Allergies:    No Known Allergies   Physical Exam:   Vitals  Blood pressure 108/64, pulse 63, temperature 98 F (36.7 C), temperature source Oral, resp. rate 17,  height 5\' 10"  (1.778 m), weight 90.2 kg (198 lb 12.8 oz), SpO2 97 %.   General: Young male lying in bed in some distress with pain HEENT: No pallor, no icterus, dry oral mucosa, supple neck, no cervical lymphadenopathy Chest: Clear to auscultation bilaterally, no added sounds CVS: Normal S1 and S2, no murmurs rub and gallop GI: Soft, nondistended, bowel sounds present, epigastric tenderness to pressure  musculoskeletal: Warm, no edema, Normal skin CNS: Alert and oriented   Data Review:    CBC Recent Labs  Lab 07/17/17 0300  WBC 7.8  HGB 15.1  HCT 45.1  PLT 327  MCV 89.5  MCH 30.0  MCHC 33.5  RDW 13.0  LYMPHSABS 1.4  MONOABS 0.7  EOSABS 0.0  BASOSABS 0.0   ------------------------------------------------------------------------------------------------------------------  Chemistries  Recent Labs  Lab 07/17/17 0300  NA 139  K 3.5  CL 106  CO2 25  GLUCOSE 107*  BUN 11  CREATININE 0.90  CALCIUM 9.4  MG 1.9  AST 22  ALT 18  ALKPHOS 51  BILITOT 1.3*   ------------------------------------------------------------------------------------------------------------------ estimated creatinine clearance is 135.6 mL/min (by C-G formula based on SCr of 0.9 mg/dL). ------------------------------------------------------------------------------------------------------------------ No results for input(s): TSH, T4TOTAL, T3FREE, THYROIDAB in the last 72 hours.  Invalid input(s): FREET3  Coagulation profile No results for input(s): INR, PROTIME in the last 168 hours. ------------------------------------------------------------------------------------------------------------------- No results for input(s): DDIMER in the last 72 hours. -------------------------------------------------------------------------------------------------------------------  Cardiac Enzymes No results for input(s): CKMB, TROPONINI, MYOGLOBIN in the last 168 hours.  Invalid input(s):  CK ------------------------------------------------------------------------------------------------------------------ No results found for: BNP   ---------------------------------------------------------------------------------------------------------------  Urinalysis    Component Value Date/Time   COLORURINE YELLOW 02/06/2015 1129   APPEARANCEUR CLEAR 02/06/2015 1129   LABSPEC <1.005 (L) 02/06/2015 1129   PHURINE 5.0 02/06/2015 1129   GLUCOSEU NEGATIVE 02/06/2015 1129   HGBUR SMALL (A) 02/06/2015 1129   BILIRUBINUR NEGATIVE 02/06/2015 1129   KETONESUR NEGATIVE 02/06/2015 1129   PROTEINUR NEGATIVE 02/06/2015 1129   UROBILINOGEN 0.2 02/06/2015 1129   NITRITE NEGATIVE 02/06/2015 1129   LEUKOCYTESUR NEGATIVE 02/06/2015 1129    ----------------------------------------------------------------------------------------------------------------   Imaging Results:    Ct Abdomen Pelvis W Contrast  Result Date: 07/17/2017 CLINICAL DATA:  31 y/o M; upper abdominal pain with nausea. History of small bowel obstruction. EXAM: CT ABDOMEN AND PELVIS WITH CONTRAST TECHNIQUE: Multidetector CT  imaging of the abdomen and pelvis was performed using the standard protocol following bolus administration of intravenous contrast. CONTRAST:  128mL ISOVUE-300 IOPAMIDOL (ISOVUE-300) INJECTION 61% COMPARISON:  10/06/2015 CT of abdomen and pelvis. FINDINGS: Lower chest: No acute abnormality. Hepatobiliary: No focal liver abnormality is seen. Status post cholecystectomy. No biliary dilatation. Pancreas: Unremarkable. No pancreatic ductal dilatation or surrounding inflammatory changes. Spleen: Normal in size without focal abnormality. Adrenals/Urinary Tract: Adrenal glands are unremarkable. Kidneys are normal, without renal calculi, focal lesion, or hydronephrosis. Bladder is unremarkable. Stomach/Bowel: Dilatation of the duodenum and proximal jejunum with moderate wall thickening of proximal jejunum. Small focus of  bowel wall pneumatosis (series 5, image 35), no pneumoperitoneum. Small bowel dilatation smoothly tapers to normal caliber within downstream distal jejunum and ileum demonstrate normal wall thickening. Normal colon. Normal appendix. Vascular/Lymphatic: No significant vascular findings are present. No enlarged abdominal or pelvic lymph nodes. Reproductive: Prostate is unremarkable. Other: No abdominal wall hernia or abnormality. No abdominopelvic ascites. Musculoskeletal: No acute or significant osseous findings. IMPRESSION: Duodenum and proximal jejunum moderate dilatation and wall thickening likely representing infectious/inflammatory enteritis. Small focus of bowel wall pneumatosis compatible with mucosal perforation, no pneumoperitoneum or abscess. Electronically Signed   By: Kristine Garbe M.D.   On: 07/17/2017 04:20   Dg Abdomen Acute W/chest  Result Date: 07/17/2017 CLINICAL DATA:  31 y/o  M; mid abdominal pain, diarrhea, nausea. EXAM: DG ABDOMEN ACUTE W/ 1V CHEST COMPARISON:  02/08/2015 abdomen radiographs FINDINGS: Dilated small bowel loops in left upper abdomen small bowel fluid levels. Right upper quadrant cholecystectomy clips. No radiopaque calculi or other significant radiographic abnormality is seen. Heart size and mediastinal contours are within normal limits. Both lungs are clear. IMPRESSION: Dilated loops of small bowel in left upper quadrant and small bowel fluid levels may represent obstruction or focal ileus. Electronically Signed   By: Kristine Garbe M.D.   On: 07/17/2017 03:43    My personal review of EKG::   Assessment & Plan:    Principal Problem:   Acute Colitis/ Enteritis CT suggestive of inflammatory versus infectious enteritis and colitis with microperforation without abscess or pneumoperitoneum. Keep nothing by mouth. Supportive care with IV fluids and antiemetics. Empiric antibiotic coverage with Rocephin and Flagyl. When necessary IV Dilaudid for  pain.Serial abdominal exam. General surgery consulted.  Nausea vomiting and diarrhea Secondary to above. Has not had diarrhea Or vomiting since admission. Monitor with hydration.  Dehydration Secondary to above. Monitor with fluids.  Tobacco abuse  counseled on cessation.  Patient trying to cut down. Order nicotine patch.     DVT Prophylaxis : Lovenox  AM Labs Ordered, also please review Full Orders  Family Communication: Admission, patients condition and plan of care including tests being ordered have been discussed with the patient   Code Status Full code  Likely DC to  Home  Condition: Fair  Consults called: Surgery   Admission status: Inpatient    Time spent in minutes : 50   Ima Hafner M.D on 07/17/2017 at 7:52 AM  Between 7am to 7pm - Pager - [phone]. After 7pm go to [email or web address] - password [facility]  [facility] - Office  [phone]

## 2017-07-17 NOTE — Progress Notes (Signed)
See full dictated note to follow.  Discussed with Dr. Constance Haw earlier today. Serial CT's were reviewed. Small bowel certainly appears abnormal. We may be dealing with an acute on chronic small bowel process. I agree with Dr. Constance Haw, he does not have a surgical process at this time. No need for urgent endoscopic evaluation either.   Would recommend further elective evaluation of small bowel via capsule (patency capsule initially) versus enteroscopy once clinically improved. Agree with antibiotics empirically. Significance of pneumatosis unknown at this time.

## 2017-07-18 ENCOUNTER — Inpatient Hospital Stay (HOSPITAL_COMMUNITY): Payer: 59

## 2017-07-18 DIAGNOSIS — K631 Perforation of intestine (nontraumatic): Secondary | ICD-10-CM

## 2017-07-18 LAB — COMPREHENSIVE METABOLIC PANEL
ALK PHOS: 46 U/L (ref 38–126)
ALT: 18 U/L (ref 17–63)
ANION GAP: 8 (ref 5–15)
AST: 17 U/L (ref 15–41)
Albumin: 3.8 g/dL (ref 3.5–5.0)
BUN: 9 mg/dL (ref 6–20)
CALCIUM: 8.6 mg/dL — AB (ref 8.9–10.3)
CO2: 24 mmol/L (ref 22–32)
CREATININE: 0.84 mg/dL (ref 0.61–1.24)
Chloride: 105 mmol/L (ref 101–111)
Glucose, Bld: 78 mg/dL (ref 65–99)
Potassium: 3.7 mmol/L (ref 3.5–5.1)
SODIUM: 137 mmol/L (ref 135–145)
Total Bilirubin: 1.6 mg/dL — ABNORMAL HIGH (ref 0.3–1.2)
Total Protein: 6.4 g/dL — ABNORMAL LOW (ref 6.5–8.1)

## 2017-07-18 LAB — CBC WITH DIFFERENTIAL/PLATELET
Basophils Absolute: 0 10*3/uL (ref 0.0–0.1)
Basophils Relative: 0 %
EOS ABS: 0.1 10*3/uL (ref 0.0–0.7)
EOS PCT: 2 %
HCT: 42.6 % (ref 39.0–52.0)
Hemoglobin: 13.9 g/dL (ref 13.0–17.0)
LYMPHS ABS: 1.4 10*3/uL (ref 0.7–4.0)
LYMPHS PCT: 24 %
MCH: 29.3 pg (ref 26.0–34.0)
MCHC: 32.6 g/dL (ref 30.0–36.0)
MCV: 89.7 fL (ref 78.0–100.0)
MONOS PCT: 10 %
Monocytes Absolute: 0.6 10*3/uL (ref 0.1–1.0)
NEUTROS PCT: 64 %
Neutro Abs: 3.7 10*3/uL (ref 1.7–7.7)
PLATELETS: 289 10*3/uL (ref 150–400)
RBC: 4.75 MIL/uL (ref 4.22–5.81)
RDW: 12.8 % (ref 11.5–15.5)
WBC: 5.9 10*3/uL (ref 4.0–10.5)

## 2017-07-18 LAB — BILIRUBIN, DIRECT: BILIRUBIN DIRECT: 0.3 mg/dL (ref 0.1–0.5)

## 2017-07-18 LAB — GASTRIN: Gastrin: 14 pg/mL (ref 0–115)

## 2017-07-18 MED ORDER — ACETAMINOPHEN 160 MG/5ML PO SOLN
650.0000 mg | Freq: Four times a day (QID) | ORAL | Status: DC
  Administered 2017-07-18 – 2017-07-20 (×8): 650 mg via ORAL
  Filled 2017-07-18 (×8): qty 20.3

## 2017-07-18 MED ORDER — HYDROMORPHONE HCL 1 MG/ML IJ SOLN
0.5000 mg | INTRAMUSCULAR | Status: DC | PRN
  Administered 2017-07-18 – 2017-07-20 (×12): 0.5 mg via INTRAVENOUS
  Filled 2017-07-18 (×12): qty 1

## 2017-07-18 MED ORDER — ENOXAPARIN SODIUM 40 MG/0.4ML ~~LOC~~ SOLN
40.0000 mg | SUBCUTANEOUS | Status: DC
  Administered 2017-07-18 – 2017-07-19 (×2): 40 mg via SUBCUTANEOUS
  Filled 2017-07-18 (×2): qty 0.4

## 2017-07-18 MED ORDER — OXYCODONE HCL 5 MG/5ML PO SOLN
7.5000 mg | ORAL | Status: DC
  Administered 2017-07-18 (×2): 7.5 mg via ORAL
  Filled 2017-07-18 (×2): qty 10

## 2017-07-18 MED ORDER — MORPHINE SULFATE (PF) 2 MG/ML IV SOLN: 2.0000 mg | INTRAVENOUS | Status: DC | PRN

## 2017-07-18 MED ORDER — HYDROMORPHONE HCL 1 MG/ML IJ SOLN
0.5000 mg | Freq: Once | INTRAMUSCULAR | Status: AC
  Administered 2017-07-18: 0.5 mg via INTRAVENOUS
  Filled 2017-07-18: qty 1

## 2017-07-18 MED ORDER — CHLORHEXIDINE GLUCONATE CLOTH 2 % EX PADS
6.0000 | MEDICATED_PAD | Freq: Every day | CUTANEOUS | Status: DC
  Administered 2017-07-19 – 2017-07-20 (×2): 6 via TOPICAL

## 2017-07-18 MED ORDER — MUPIROCIN 2 % EX OINT
1.0000 "application " | TOPICAL_OINTMENT | Freq: Two times a day (BID) | CUTANEOUS | Status: DC
  Administered 2017-07-18 – 2017-07-19 (×4): 1 via NASAL
  Filled 2017-07-18: qty 22

## 2017-07-18 MED ORDER — OXYCODONE HCL 5 MG/5ML PO SOLN
10.0000 mg | ORAL | Status: DC
  Administered 2017-07-18 – 2017-07-20 (×10): 10 mg via ORAL
  Filled 2017-07-18 (×10): qty 10

## 2017-07-18 MED ORDER — PANTOPRAZOLE SODIUM 40 MG IV SOLR
40.0000 mg | Freq: Two times a day (BID) | INTRAVENOUS | Status: DC
  Administered 2017-07-18 – 2017-07-19 (×3): 40 mg via INTRAVENOUS
  Filled 2017-07-18 (×3): qty 40

## 2017-07-18 NOTE — Progress Notes (Signed)
[facility]  Continues to have focal LUQ pain. Doubles patient over at times. Is writhing in pain currently. Roxicodone and tylenol scheduled have done little to help.   No rebound or guarding, focal tenderness in the LUQ  BP (!) 117/55 (BP Location: Left Arm)   Pulse (!) 54   Temp 98.3 F (36.8 C) (Oral)   Resp 16   Ht 5\' 10"  (1.778 m)   Wt 198 lb 12.8 oz (90.2 kg)   SpO2 98%   BMI 28.52 kg/m   Plan for labs in the AM including lipase. Plan for CT with po contrast and IV contrast in AM Dilaudid 0.5 ordered for now, dilaudid 0.5q2 PRN, and roxicodone increased to 10mg  q4 scheduled   Curlene Labrum, MD [facility] [address] [place], [place] [address] [phone] (office)

## 2017-07-18 NOTE — Progress Notes (Signed)
PROGRESS NOTE                                                                                                                                                                                                             Patient Demographics:    [name] [name], is a 31 y.o. male, DOB - 04/26/1986, FUX:[id number]  Admit date - 07/17/2017   Admitting Physician Louellen Molder, MD  Outpatient Primary MD for the patient is Redmond School, MD  LOS - 1  Outpatient Specialists: none  Chief Complaint  Patient presents with  . Abdominal Pain       Brief Narrative   31 year old male with history of umbilical hernia repair and cholecystectomy about 12 years back, chronic abdominal pain (previously on narcotics) presented with increasing 4 day history of epigastric pain. This was associated with watery diarrhea and a few episodes of vomiting. CT of the abdomen and pelvis in the ED showed moderate dilatation of duodenal lumen proximal jejunum with possible enteritis. Also showed small focus of bowel wall pneumatosis with mucosal perforation without pneumoperitoneum or abscess.    Subjective:   Still having epigastric pain. No Nausea, vomiting or diarrhea.Remains afebrile.   Assessment  & Plan :    Principal Problem:   Colitis With? Pneumatosis and mucosal perforation Continue NPO. IV fluids And empiric antibiotics. Added Scheduled Roxicodone and Tylenol for pain. Surgery consult appreciated and plan on CT with by mouth and IV contrast tomorrow to evaluate for any changes since admission. If improved will start him on clear liquids .  GI consult appreciated who recommend Outpatient small bowel capsule study versus enteroscopy once clinically improved.   Tobacco abuse Counseled on cessation. Nicotine patch.   Code Status : Full code  Family Communication  : None at bedside  Disposition Plan  : Home in the next few days if  clinically improved  Barriers For Discharge : Active symptoms  Consults  :   Surgery GI  Procedures  :  CT abdomen pelvis  DVT Prophylaxis  :  Lovenox -   Lab Results  Component Value Date   PLT 289 07/18/2017    Antibiotics  :    Anti-infectives (From admission, onward)   Start     Dose/Rate Route Frequency Ordered Stop   07/18/17 0500  cefTRIAXone (ROCEPHIN) 2 g in dextrose 5 %  50 mL IVPB     2 g 100 mL/hr over 30 Minutes Intravenous Every 24 hours 07/17/17 0544     07/17/17 1300  metroNIDAZOLE (FLAGYL) IVPB 500 mg     500 mg 100 mL/hr over 60 Minutes Intravenous Every 8 hours 07/17/17 0544     07/17/17 0445  cefTRIAXone (ROCEPHIN) 2 g in dextrose 5 % 50 mL IVPB     2 g 100 mL/hr over 30 Minutes Intravenous  Once 07/17/17 0431 07/17/17 0530   07/17/17 0445  metroNIDAZOLE (FLAGYL) IVPB 500 mg     500 mg 100 mL/hr over 60 Minutes Intravenous  Once 07/17/17 0431 07/17/17 0630        Objective:   Vitals:   07/17/17 0600 07/17/17 1300 07/17/17 2020 07/18/17 0613  BP: 108/64 112/66 (!) 108/49 (!) 117/55  Pulse: 63 (!) 56 60 (!) 54  Resp:  18 16 16   Temp: 98 F (36.7 C) 98.7 F (37.1 C) 98.9 F (37.2 C) 98.3 F (36.8 C)  TempSrc: Oral Oral Oral Oral  SpO2: 97% 98% 96% 98%  Weight: 90.2 kg (198 lb 12.8 oz)     Height: 5\' 10"  (1.778 m)       Wt Readings from Last 3 Encounters:  07/17/17 90.2 kg (198 lb 12.8 oz)  06/20/16 81.6 kg (180 lb)  10/06/15 81.6 kg (180 lb)     Intake/Output Summary (Last 24 hours) at 07/18/2017 1020 Last data filed at 07/18/2017 0437 Gross per 24 hour  Intake 2970.83 ml  Output 600 ml  Net 2370.83 ml     Physical Exam  Gen: not in distress HEENT: moist mucosa, supple neck Chest: clear b/l, no added sounds CVS: N S1&S2, no murmurs, GI: soft, Nondistended, bowel sounds present, tender to pressure mainly in epigastric and supraumbilical area (unchanged) Musculoskeletal: warm, no edema     Data Review:    CBC Recent  Labs  Lab 07/17/17 0300 07/18/17 0456  WBC 7.8 5.9  HGB 15.1 13.9  HCT 45.1 42.6  PLT 327 289  MCV 89.5 89.7  MCH 30.0 29.3  MCHC 33.5 32.6  RDW 13.0 12.8  LYMPHSABS 1.4 1.4  MONOABS 0.7 0.6  EOSABS 0.0 0.1  BASOSABS 0.0 0.0    Chemistries  Recent Labs  Lab 07/17/17 0300 07/18/17 0456  NA 139 137  K 3.5 3.7  CL 106 105  CO2 25 24  GLUCOSE 107* 78  BUN 11 9  CREATININE 0.90 0.84  CALCIUM 9.4 8.6*  MG 1.9  --   AST 22 17  ALT 18 18  ALKPHOS 51 46  BILITOT 1.3* 1.6*   ------------------------------------------------------------------------------------------------------------------ No results for input(s): CHOL, HDL, LDLCALC, TRIG, CHOLHDL, LDLDIRECT in the last 72 hours.  No results found for: HGBA1C ------------------------------------------------------------------------------------------------------------------ No results for input(s): TSH, T4TOTAL, T3FREE, THYROIDAB in the last 72 hours.  Invalid input(s): FREET3 ------------------------------------------------------------------------------------------------------------------ No results for input(s): VITAMINB12, FOLATE, FERRITIN, TIBC, IRON, RETICCTPCT in the last 72 hours.  Coagulation profile No results for input(s): INR, PROTIME in the last 168 hours.  No results for input(s): DDIMER in the last 72 hours.  Cardiac Enzymes No results for input(s): CKMB, TROPONINI, MYOGLOBIN in the last 168 hours.  Invalid input(s): CK ------------------------------------------------------------------------------------------------------------------ No results found for: BNP  Inpatient Medications  Scheduled Meds: . acetaminophen (TYLENOL) oral liquid 160 mg/5 mL  650 mg Oral Q6H  . [START ON 07/19/2017] Chlorhexidine Gluconate Cloth  6 each Topical Q0600  . mupirocin ointment  1 application Nasal BID  .  nicotine  14 mg Transdermal Daily  . oxyCODONE  7.5 mg Oral Q4H  . pantoprazole (PROTONIX) IV  40 mg Intravenous  Q12H   Continuous Infusions: . sodium chloride 125 mL/hr at 07/18/17 0255  . cefTRIAXone (ROCEPHIN)  IV Stopped (07/18/17 0500)  . metronidazole Stopped (07/18/17 0450)   PRN Meds:.morphine injection, ondansetron **OR** ondansetron (ZOFRAN) IV, prochlorperazine  Micro Results Recent Results (from the past 240 hour(s))  MRSA PCR Screening     Status: Abnormal   Collection Time: 07/17/17  6:11 AM  Result Value Ref Range Status   MRSA by PCR POSITIVE (A) NEGATIVE Final    Comment:        The GeneXpert MRSA Assay (FDA approved for NASAL specimens only), is one component of a comprehensive MRSA colonization surveillance program. It is not intended to diagnose MRSA infection nor to guide or monitor treatment for MRSA infections. RESULT CALLED TO, READ BACK BY AND VERIFIED WITH: DILDY V. AT 0936A ON [id number] BY THOMPSON S.     Radiology Reports Ct Abdomen Pelvis W Contrast  Result Date: 07/17/2017 CLINICAL DATA:  31 y/o M; upper abdominal pain with nausea. History of small bowel obstruction. EXAM: CT ABDOMEN AND PELVIS WITH CONTRAST TECHNIQUE: Multidetector CT imaging of the abdomen and pelvis was performed using the standard protocol following bolus administration of intravenous contrast. CONTRAST:  142mL ISOVUE-300 IOPAMIDOL (ISOVUE-300) INJECTION 61% COMPARISON:  10/06/2015 CT of abdomen and pelvis. FINDINGS: Lower chest: No acute abnormality. Hepatobiliary: No focal liver abnormality is seen. Status post cholecystectomy. No biliary dilatation. Pancreas: Unremarkable. No pancreatic ductal dilatation or surrounding inflammatory changes. Spleen: Normal in size without focal abnormality. Adrenals/Urinary Tract: Adrenal glands are unremarkable. Kidneys are normal, without renal calculi, focal lesion, or hydronephrosis. Bladder is unremarkable. Stomach/Bowel: Dilatation of the duodenum and proximal jejunum with moderate wall thickening of proximal jejunum. Small focus of bowel wall pneumatosis  (series 5, image 35), no pneumoperitoneum. Small bowel dilatation smoothly tapers to normal caliber within downstream distal jejunum and ileum demonstrate normal wall thickening. Normal colon. Normal appendix. Vascular/Lymphatic: No significant vascular findings are present. No enlarged abdominal or pelvic lymph nodes. Reproductive: Prostate is unremarkable. Other: No abdominal wall hernia or abnormality. No abdominopelvic ascites. Musculoskeletal: No acute or significant osseous findings. IMPRESSION: Duodenum and proximal jejunum moderate dilatation and wall thickening likely representing infectious/inflammatory enteritis. Small focus of bowel wall pneumatosis compatible with mucosal perforation, no pneumoperitoneum or abscess. Electronically Signed   By: Kristine Garbe M.D.   On: 07/17/2017 04:20   Dg Abdomen Acute W/chest  Result Date: 07/17/2017 CLINICAL DATA:  31 y/o  M; mid abdominal pain, diarrhea, nausea. EXAM: DG ABDOMEN ACUTE W/ 1V CHEST COMPARISON:  02/08/2015 abdomen radiographs FINDINGS: Dilated small bowel loops in left upper abdomen small bowel fluid levels. Right upper quadrant cholecystectomy clips. No radiopaque calculi or other significant radiographic abnormality is seen. Heart size and mediastinal contours are within normal limits. Both lungs are clear. IMPRESSION: Dilated loops of small bowel in left upper quadrant and small bowel fluid levels may represent obstruction or focal ileus. Electronically Signed   By: Kristine Garbe M.D.   On: 07/17/2017 03:43    Time Spent in minutes  25   Diontae Route M.D on 07/18/2017 at 10:20 AM  Between 7am to 7pm - Pager - [phone]  After 7pm go to [email or web address] - password [facility]  [facility] -  Office  [phone]

## 2017-07-18 NOTE — Progress Notes (Addendum)
Subjective:  Persistent pain, epig/luq. No BM. No n/v.   Objective: Vital signs in last 24 hours: Temp:  [98.3 F (36.8 C)-98.9 F (37.2 C)] 98.3 F (36.8 C) (12/06 9242) Pulse Rate:  [54-60] 54 (12/06 0613) Resp:  [16-18] 16 (12/06 0613) BP: (108-117)/(49-66) 117/55 (12/06 6834) SpO2:  [96 %-98 %] 98 % (12/06 0613) Last BM Date: 07/16/17 General:   Alert,  Well-developed, well-nourished, pleasant and cooperative in NAD Head:  Normocephalic and atraumatic. Eyes:  Sclera clear, no icterus.  Abdomen:  Soft, moderate tenderness in epig/luq. nondistended. Normal bowel sounds, without guarding, and without rebound.   Extremities:  Without clubbing, deformity or edema. Neurologic:  Alert and  oriented x4;  grossly normal neurologically. Skin:  Intact without significant lesions or rashes. Psych:  Alert and cooperative. Normal mood and affect.  Intake/Output from previous day: 12/05 0701 - 12/06 0700 In: 2970.8 [I.V.:2520.8; IV Piggyback:450] Out: 600 [Urine:600] Intake/Output this shift: No intake/output data recorded.  Lab Results: CBC Recent Labs    07/17/17 0300 07/18/17 0456  WBC 7.8 5.9  HGB 15.1 13.9  HCT 45.1 42.6  MCV 89.5 89.7  PLT 327 289   BMET Recent Labs    07/17/17 0300 07/18/17 0456  NA 139 137  K 3.5 3.7  CL 106 105  CO2 25 24  GLUCOSE 107* 78  BUN 11 9  CREATININE 0.90 0.84  CALCIUM 9.4 8.6*   LFTs Recent Labs    07/17/17 0300 07/18/17 0456  BILITOT 1.3* 1.6*  BILIDIR  --  0.3  ALKPHOS 51 46  AST 22 17  ALT 18 18  PROT 7.9 6.4*  ALBUMIN 4.6 3.8   Recent Labs    07/17/17 0300  LIPASE 31   PT/INR No results for input(s): LABPROT, INR in the last 72 hours.    Imaging Studies: Ct Abdomen Pelvis W Contrast  Result Date: 07/17/2017 CLINICAL DATA:  31 y/o M; upper abdominal pain with nausea. History of small bowel obstruction. EXAM: CT ABDOMEN AND PELVIS WITH CONTRAST TECHNIQUE: Multidetector CT imaging of the abdomen and pelvis  was performed using the standard protocol following bolus administration of intravenous contrast. CONTRAST:  13mL ISOVUE-300 IOPAMIDOL (ISOVUE-300) INJECTION 61% COMPARISON:  10/06/2015 CT of abdomen and pelvis. FINDINGS: Lower chest: No acute abnormality. Hepatobiliary: No focal liver abnormality is seen. Status post cholecystectomy. No biliary dilatation. Pancreas: Unremarkable. No pancreatic ductal dilatation or surrounding inflammatory changes. Spleen: Normal in size without focal abnormality. Adrenals/Urinary Tract: Adrenal glands are unremarkable. Kidneys are normal, without renal calculi, focal lesion, or hydronephrosis. Bladder is unremarkable. Stomach/Bowel: Dilatation of the duodenum and proximal jejunum with moderate wall thickening of proximal jejunum. Small focus of bowel wall pneumatosis (series 5, image 35), no pneumoperitoneum. Small bowel dilatation smoothly tapers to normal caliber within downstream distal jejunum and ileum demonstrate normal wall thickening. Normal colon. Normal appendix. Vascular/Lymphatic: No significant vascular findings are present. No enlarged abdominal or pelvic lymph nodes. Reproductive: Prostate is unremarkable. Other: No abdominal wall hernia or abnormality. No abdominopelvic ascites. Musculoskeletal: No acute or significant osseous findings. IMPRESSION: Duodenum and proximal jejunum moderate dilatation and wall thickening likely representing infectious/inflammatory enteritis. Small focus of bowel wall pneumatosis compatible with mucosal perforation, no pneumoperitoneum or abscess. Electronically Signed   By: Kristine Garbe M.D.   On: 07/17/2017 04:20   Dg Abdomen Acute W/chest  Result Date: 07/17/2017 CLINICAL DATA:  31 y/o  M; mid abdominal pain, diarrhea, nausea. EXAM: DG ABDOMEN ACUTE W/ 1V CHEST COMPARISON:  02/08/2015 abdomen  radiographs FINDINGS: Dilated small bowel loops in left upper abdomen small bowel fluid levels. Right upper quadrant  cholecystectomy clips. No radiopaque calculi or other significant radiographic abnormality is seen. Heart size and mediastinal contours are within normal limits. Both lungs are clear. IMPRESSION: Dilated loops of small bowel in left upper quadrant and small bowel fluid levels may represent obstruction or focal ileus. Electronically Signed   By: Kristine Garbe M.D.   On: 07/17/2017 03:43  [2 weeks]   Assessment: 31 year old male with recurrent acute on chronic abdominal pain with abnormality seen on multiple imaging studies of the proximal small bowel as outlined previously.  Currently with moderate dilatation with wall thickening of the duodenum and proximal jejunum question infectious/inflammatory enteritis.  He has small focus of bowel wall pneumatosis but no abscess or pneumoperitoneum. Being followed by surgery as well. Symptoms unchanged.    Plan: 1. Antibiotics empirically. 2. Will follow up repeat CT scan tomorrow as per Dr. Constance Haw.  3. Continue NPO as per surgery.  4. Eventually will need further evaluation of the small bowel possibly enteroscopy versus small bowel capsule study (patency capsule initially).  Laureen Ochs. Bernarda Caffey [facility] [phone] 12/6/201810:10 AM     LOS: 1 day    Attending note:  Patient seen and examined. Abdomen remains soft. Agree with keeping him nothing by mouth and repeating CAT scan tomorrow.

## 2017-07-18 NOTE — Progress Notes (Signed)
[facility] Progress Note     Subjective: Continues to have pain in the LUQ. Morphine helps for about 30 min to 1 hour.  No BM, no nausea,vomting.   Objective: Vital signs in last 24 hours: Temp:  [98.3 F (36.8 C)-98.9 F (37.2 C)] 98.3 F (36.8 C) (12/06 6237) Pulse Rate:  [54-60] 54 (12/06 0613) Resp:  [16-18] 16 (12/06 0613) BP: (108-117)/(49-66) 117/55 (12/06 0613) SpO2:  [96 %-98 %] 98 % (12/06 0613) Last BM Date: 07/16/17  Intake/Output from previous day: 12/05 0701 - 12/06 0700 In: 2970.8 [I.V.:2520.8; IV Piggyback:450] Out: 600 [Urine:600] Intake/Output this shift: No intake/output data recorded.  General appearance: alert, cooperative and mild distress Resp: normal work breathing GI: soft, LUQ tenderness, focal, guarding focally but otherwise no rebound or guarding in remaining abdomen Extremities: extremities normal, atraumatic, no cyanosis or edema  Lab Results:  Recent Labs    07/17/17 0300 07/18/17 0456  WBC 7.8 5.9  HGB 15.1 13.9  HCT 45.1 42.6  PLT 327 289   BMET Recent Labs    07/17/17 0300 07/18/17 0456  NA 139 137  K 3.5 3.7  CL 106 105  CO2 25 24  GLUCOSE 107* 78  BUN 11 9  CREATININE 0.90 0.84  CALCIUM 9.4 8.6*    Studies/Results: Ct Abdomen Pelvis W Contrast  Result Date: 07/17/2017 CLINICAL DATA:  31 y/o M; upper abdominal pain with nausea. History of small bowel obstruction. EXAM: CT ABDOMEN AND PELVIS WITH CONTRAST TECHNIQUE: Multidetector CT imaging of the abdomen and pelvis was performed using the standard protocol following bolus administration of intravenous contrast. CONTRAST:  151mL ISOVUE-300 IOPAMIDOL (ISOVUE-300) INJECTION 61% COMPARISON:  10/06/2015 CT of abdomen and pelvis. FINDINGS: Lower chest: No acute abnormality. Hepatobiliary: No focal liver abnormality is seen. Status post cholecystectomy. No biliary dilatation. Pancreas: Unremarkable. No pancreatic ductal dilatation or surrounding inflammatory  changes. Spleen: Normal in size without focal abnormality. Adrenals/Urinary Tract: Adrenal glands are unremarkable. Kidneys are normal, without renal calculi, focal lesion, or hydronephrosis. Bladder is unremarkable. Stomach/Bowel: Dilatation of the duodenum and proximal jejunum with moderate wall thickening of proximal jejunum. Small focus of bowel wall pneumatosis (series 5, image 35), no pneumoperitoneum. Small bowel dilatation smoothly tapers to normal caliber within downstream distal jejunum and ileum demonstrate normal wall thickening. Normal colon. Normal appendix. Vascular/Lymphatic: No significant vascular findings are present. No enlarged abdominal or pelvic lymph nodes. Reproductive: Prostate is unremarkable. Other: No abdominal wall hernia or abnormality. No abdominopelvic ascites. Musculoskeletal: No acute or significant osseous findings. IMPRESSION: Duodenum and proximal jejunum moderate dilatation and wall thickening likely representing infectious/inflammatory enteritis. Small focus of bowel wall pneumatosis compatible with mucosal perforation, no pneumoperitoneum or abscess. Electronically Signed   By: Kristine Garbe M.D.   On: 07/17/2017 04:20   Dg Abdomen Acute W/chest  Result Date: 07/17/2017 CLINICAL DATA:  31 y/o  M; mid abdominal pain, diarrhea, nausea. EXAM: DG ABDOMEN ACUTE W/ 1V CHEST COMPARISON:  02/08/2015 abdomen radiographs FINDINGS: Dilated small bowel loops in left upper abdomen small bowel fluid levels. Right upper quadrant cholecystectomy clips. No radiopaque calculi or other significant radiographic abnormality is seen. Heart size and mediastinal contours are within normal limits. Both lungs are clear. IMPRESSION: Dilated loops of small bowel in left upper quadrant and small bowel fluid levels may represent obstruction or focal ileus. Electronically Signed   By: Kristine Garbe M.D.   On: 07/17/2017 03:43    Anti-infectives: Anti-infectives (From  admission, onward)   Start  Dose/Rate Route Frequency Ordered Stop   07/18/17 0500  cefTRIAXone (ROCEPHIN) 2 g in dextrose 5 % 50 mL IVPB     2 g 100 mL/hr over 30 Minutes Intravenous Every 24 hours 07/17/17 0544     07/17/17 1300  metroNIDAZOLE (FLAGYL) IVPB 500 mg     500 mg 100 mL/hr over 60 Minutes Intravenous Every 8 hours 07/17/17 0544     07/17/17 0445  cefTRIAXone (ROCEPHIN) 2 g in dextrose 5 % 50 mL IVPB     2 g 100 mL/hr over 30 Minutes Intravenous  Once 07/17/17 0431 07/17/17 0530   07/17/17 0445  metroNIDAZOLE (FLAGYL) IVPB 500 mg     500 mg 100 mL/hr over 60 Minutes Intravenous  Once 07/17/17 0431 07/17/17 0630      Assessment/Plan: [name] [name] is a 31 yo with a acute on chronic abdominal pain of unknown etiology with duodenitis and jejunitis and pneumatosis of the jejunum on the last CT scan. Has been stable hemodynamically and no leukocytosis. Exam relatively unchanged.  -Pepcid changed to Protonix BID -NPO with meds, added Roxicodone 7.5mg  scheduled q4 liquid and tylenol 650 q6 scheduled liquid  -Plan for CT with po and IV contrast tomorrow AM -Discussed plan for workup in the upcoming weeks to allow for this inflammation/ pneumatosis to resolve, GI following and appreciate assistance  -Continue antibiotics for now    LOS: 1 day   Updated Dr. Clementeen Graham.   [name] [name] 07/18/2017

## 2017-07-19 ENCOUNTER — Encounter (HOSPITAL_COMMUNITY): Payer: Self-pay | Admitting: Radiology

## 2017-07-19 ENCOUNTER — Inpatient Hospital Stay (HOSPITAL_COMMUNITY): Payer: 59

## 2017-07-19 DIAGNOSIS — R112 Nausea with vomiting, unspecified: Secondary | ICD-10-CM

## 2017-07-19 DIAGNOSIS — K561 Intussusception: Secondary | ICD-10-CM | POA: Diagnosis present

## 2017-07-19 DIAGNOSIS — R197 Diarrhea, unspecified: Secondary | ICD-10-CM

## 2017-07-19 DIAGNOSIS — K529 Noninfective gastroenteritis and colitis, unspecified: Principal | ICD-10-CM

## 2017-07-19 DIAGNOSIS — E876 Hypokalemia: Secondary | ICD-10-CM

## 2017-07-19 LAB — CBC WITH DIFFERENTIAL/PLATELET
BASOS ABS: 0 10*3/uL (ref 0.0–0.1)
BASOS PCT: 1 %
Eosinophils Absolute: 0.1 10*3/uL (ref 0.0–0.7)
Eosinophils Relative: 2 %
HEMATOCRIT: 42.9 % (ref 39.0–52.0)
HEMOGLOBIN: 13.9 g/dL (ref 13.0–17.0)
Lymphocytes Relative: 40 %
Lymphs Abs: 2.5 10*3/uL (ref 0.7–4.0)
MCH: 29 pg (ref 26.0–34.0)
MCHC: 32.4 g/dL (ref 30.0–36.0)
MCV: 89.4 fL (ref 78.0–100.0)
Monocytes Absolute: 0.6 10*3/uL (ref 0.1–1.0)
Monocytes Relative: 10 %
NEUTROS ABS: 3 10*3/uL (ref 1.7–7.7)
NEUTROS PCT: 47 %
Platelets: 319 10*3/uL (ref 150–400)
RBC: 4.8 MIL/uL (ref 4.22–5.81)
RDW: 12.6 % (ref 11.5–15.5)
WBC: 6.2 10*3/uL (ref 4.0–10.5)

## 2017-07-19 LAB — COMPREHENSIVE METABOLIC PANEL
ALBUMIN: 4.2 g/dL (ref 3.5–5.0)
ALK PHOS: 49 U/L (ref 38–126)
ALT: 23 U/L (ref 17–63)
AST: 25 U/L (ref 15–41)
Anion gap: 9 (ref 5–15)
BILIRUBIN TOTAL: 1.3 mg/dL — AB (ref 0.3–1.2)
BUN: 10 mg/dL (ref 6–20)
CO2: 26 mmol/L (ref 22–32)
Calcium: 8.6 mg/dL — ABNORMAL LOW (ref 8.9–10.3)
Chloride: 102 mmol/L (ref 101–111)
Creatinine, Ser: 0.88 mg/dL (ref 0.61–1.24)
GFR calc Af Amer: >60 mL/min (ref >60)
GFR calc non Af Amer: >60 mL/min (ref >60)
GLUCOSE: 75 mg/dL (ref 65–99)
POTASSIUM: 3.2 mmol/L — AB (ref 3.5–5.1)
SODIUM: 137 mmol/L (ref 135–145)
TOTAL PROTEIN: 6.8 g/dL (ref 6.5–8.1)

## 2017-07-19 LAB — HIV ANTIBODY (ROUTINE TESTING W REFLEX): HIV SCREEN 4TH GENERATION: NONREACTIVE

## 2017-07-19 LAB — LIPASE, BLOOD: Lipase: 30 U/L (ref 11–51)

## 2017-07-19 MED ORDER — POTASSIUM CHLORIDE 10 MEQ/100ML IV SOLN
10.0000 meq | INTRAVENOUS | Status: AC
  Administered 2017-07-19 (×4): 10 meq via INTRAVENOUS
  Filled 2017-07-19 (×4): qty 100

## 2017-07-19 MED ORDER — IOPAMIDOL (ISOVUE-300) INJECTION 61%
INTRAVENOUS | Status: AC
  Administered 2017-07-19: 30 mL
  Filled 2017-07-19: qty 30

## 2017-07-19 MED ORDER — PANTOPRAZOLE SODIUM 40 MG PO TBEC
40.0000 mg | DELAYED_RELEASE_TABLET | Freq: Every day | ORAL | Status: DC
  Administered 2017-07-20: 40 mg via ORAL
  Filled 2017-07-19: qty 1

## 2017-07-19 MED ORDER — IOPAMIDOL (ISOVUE-300) INJECTION 61%
100.0000 mL | Freq: Once | INTRAVENOUS | Status: AC | PRN
  Administered 2017-07-19: 100 mL via INTRAVENOUS

## 2017-07-19 NOTE — Progress Notes (Signed)
Subjective: Patient was seen just after he returned from cross-sectional imaging.  Continues with upper abdominal pain.  It is better than it was previously.  Yesterday he was 10 out of 10 in severe pain.  Today he was 7 out of 10, though better earlier in the morning.  No nausea or vomiting.  He had a bowel movement today without blood.  No other GI concerns.  Family did voice concern that he has been admitted multiple times for similar episodes.  Objective: Vital signs in last 24 hours: Temp:  [97.9 F (36.6 C)-98.6 F (37 C)] 97.9 F (36.6 C) (12/07 0436) Pulse Rate:  [56-66] 65 (12/07 0436) Resp:  [16-20] 20 (12/07 0436) BP: (104-118)/(50-66) 118/66 (12/07 0436) SpO2:  [95 %-98 %] 98 % (12/07 0436) Last BM Date: 07/16/17 General:   Alert and oriented, pleasant Head:  Normocephalic and atraumatic. Eyes:  No icterus, sclera clear. Conjuctiva pink.  Heart:  S1, S2 present, no murmurs noted.  Lungs: Clear to auscultation bilaterally, without wheezing, rales, or rhonchi.  Abdomen:  Bowel sounds present, soft, non-distended. No HSM or hernias noted. No rebound or guarding. No masses appreciated  Msk:  Normal posture. Pulses:  Normal bilateral DP pulses noted. Extremities:  Without clubbing or edema. Neurologic:  Alert and  oriented x4;  grossly normal neurologically. Psych:  Alert and cooperative. Normal mood and affect.  Intake/Output from previous day: 12/06 0701 - 12/07 0700 In: 3208.3 [I.V.:3008.3; IV Piggyback:200] Out: 600 [Urine:600] Intake/Output this shift: No intake/output data recorded.  Lab Results: Recent Labs    07/17/17 0300 07/18/17 0456 07/19/17 0359  WBC 7.8 5.9 6.2  HGB 15.1 13.9 13.9  HCT 45.1 42.6 42.9  PLT 327 289 319   BMET Recent Labs    07/17/17 0300 07/18/17 0456 07/19/17 0359  NA 139 137 137  K 3.5 3.7 3.2*  CL 106 105 102  CO2 25 24 26   GLUCOSE 107* 78 75  BUN 11 9 10   CREATININE 0.90 0.84 0.88  CALCIUM 9.4 8.6* 8.6*    LFT Recent Labs    07/17/17 0300 07/18/17 0456 07/19/17 0359  PROT 7.9 6.4* 6.8  ALBUMIN 4.6 3.8 4.2  AST 22 17 25   ALT 18 18 23   ALKPHOS 51 46 49  BILITOT 1.3* 1.6* 1.3*  BILIDIR  --  0.3  --    PT/INR No results for input(s): LABPROT, INR in the last 72 hours. Hepatitis Panel No results for input(s): HEPBSAG, HCVAB, HEPAIGM, HEPBIGM in the last 72 hours.   Studies/Results: No results found.  Assessment: 31 year old male with recurrent acute on chronic abdominal pain with abnormality seen on multiple imaging studies of the proximal small bowel as outlined previously.  Currently with moderate dilatation with wall thickening of the duodenum and proximal jejunum question infectious/inflammatory enteritis.  He had small focus of bowel wall pneumatosis 1-2 days ago but no abscess or pneumoperitoneum. Being followed by surgery as well.  Today symptoms are somewhat improved.  He does still have persistent abdominal pain.  CT the abdomen felt no small bowel wall thickening or pneumatosis on current study, no free air.  Short segment jejunojejunal intussusception in the left mid abdomen likely transient/benign.  Trace pelvic ascites likely related to recent enteritis.  Multiple studies have been completed and his jejunum has never really appeared normal.  He is improving somewhat today.  Spoke with surgery and they feel he would benefit from small bowel imaging and/or CT enteroscopy prior to possible laparoscopy  for further evaluation.  This is a patient of Dr. Laural Golden and we discussed the need to follow-up with him as an outpatient when he is discharged from the hospital.  Plan: 1. Continue supportive care 2.  Pain management per hospitalist 3. Plan outpatient evaluation by Dr. Laural Golden and/or surgery   Thank you for allowing [place] to participate in the care of [facility], DNP, AGNP-C Adult & Gerontological Nurse Practitioner [facility]    LOS:  2 days    07/19/2017, 8:28 AM

## 2017-07-19 NOTE — Progress Notes (Signed)
PROGRESS NOTE                                                                                                                                                                                                             Patient Demographics:    [name] [name], is a 31 y.o. male, DOB - 1986-07-18, QIO:[id number]  Admit date - 07/17/2017   Admitting Physician Louellen Molder, MD  Outpatient Primary MD for the patient is Redmond School, MD  LOS - 2  Outpatient Specialists: none  Chief Complaint  Patient presents with  . Abdominal Pain       Brief Narrative   31 year old male with history of umbilical hernia repair and cholecystectomy about 12 years back, chronic abdominal pain (previously on narcotics) presented with increasing 4 day history of epigastric pain. This was associated with watery diarrhea and a few episodes of vomiting. CT of the abdomen and pelvis in the ED showed moderate dilatation of duodenal lumen proximal jejunum with possible enteritis. Also showed small focus of bowel wall pneumatosis with mucosal perforation without pneumoperitoneum or abscess.    Subjective:   Has severe abdominal pain yesterday, finally improved after pain medications adjusted.   Assessment  & Plan :    Principal Problem:   Colitis With? Pneumatosis and mucosal perforation Persistent pain requiring escalation of the medication. Follow-up CT scan with by mouth and IV contrast today showing No small bowel wall thickening or pneumatosis. There is a short segment jejunojejunal intussusception in the left mid abdomen which is commended air is a transient or benign. Pain better controlled with Roxicodone (Scheduled) and low-dose when necessary Dilaudid. Will discuss with surgery on starting clear liquid.Continue antibiotics.  Hypokalemia Replenish  Tobacco abuse Counseled on cessation. Nicotine patch.   Code Status : Full  code  Family Communication  : Mother at bedside  Disposition Plan  : Home Possibly in next 48 hours if improved and tolerating diet  Barriers For Discharge : Active symptoms  Consults  :   Surgery GI  Procedures  :  CT abdomen pelvis  DVT Prophylaxis  :  Lovenox -   Lab Results  Component Value Date   PLT 319 07/19/2017    Antibiotics  :    Anti-infectives (From admission, onward)   Start     Dose/Rate Route Frequency Ordered Stop  07/18/17 0500  cefTRIAXone (ROCEPHIN) 2 g in dextrose 5 % 50 mL IVPB     2 g 100 mL/hr over 30 Minutes Intravenous Every 24 hours 07/17/17 0544     07/17/17 1300  metroNIDAZOLE (FLAGYL) IVPB 500 mg     500 mg 100 mL/hr over 60 Minutes Intravenous Every 8 hours 07/17/17 0544     07/17/17 0445  cefTRIAXone (ROCEPHIN) 2 g in dextrose 5 % 50 mL IVPB     2 g 100 mL/hr over 30 Minutes Intravenous  Once 07/17/17 0431 07/17/17 0530   07/17/17 0445  metroNIDAZOLE (FLAGYL) IVPB 500 mg     500 mg 100 mL/hr over 60 Minutes Intravenous  Once 07/17/17 0431 07/17/17 0630        Objective:   Vitals:   07/18/17 0613 07/18/17 1400 07/18/17 2141 07/19/17 0436  BP: (!) 117/55 104/62 (!) 116/50 118/66  Pulse: (!) 54 66 (!) 56 65  Resp: 16 16 17 20   Temp: 98.3 F (36.8 C) 98.6 F (37 C) 98.6 F (37 C) 97.9 F (36.6 C)  TempSrc: Oral  Oral Oral  SpO2: 98% 98% 95% 98%  Weight:      Height:        Wt Readings from Last 3 Encounters:  07/17/17 90.2 kg (198 lb 12.8 oz)  06/20/16 81.6 kg (180 lb)  10/06/15 81.6 kg (180 lb)     Intake/Output Summary (Last 24 hours) at 07/19/2017 1035 Last data filed at 07/19/2017 1014 Gross per 24 hour  Intake 3408.34 ml  Output 600 ml  Net 2808.34 ml     Physical Exam Gen.: Not in distress  HEENT: Moist mucosa, supple neck chest: Clear bilaterally neck signs he is: Normal S1 and S2, no murmurs GI: Soft, bowel sounds present, nondistended, epigastric and periumbilical tenderness (improved from  yesterday) Musculoskeletal: Warm, no edema     Data Review:    CBC Recent Labs  Lab 07/17/17 0300 07/18/17 0456 07/19/17 0359  WBC 7.8 5.9 6.2  HGB 15.1 13.9 13.9  HCT 45.1 42.6 42.9  PLT 327 289 319  MCV 89.5 89.7 89.4  MCH 30.0 29.3 29.0  MCHC 33.5 32.6 32.4  RDW 13.0 12.8 12.6  LYMPHSABS 1.4 1.4 2.5  MONOABS 0.7 0.6 0.6  EOSABS 0.0 0.1 0.1  BASOSABS 0.0 0.0 0.0    Chemistries  Recent Labs  Lab 07/17/17 0300 07/18/17 0456 07/19/17 0359  NA 139 137 137  K 3.5 3.7 3.2*  CL 106 105 102  CO2 25 24 26   GLUCOSE 107* 78 75  BUN 11 9 10   CREATININE 0.90 0.84 0.88  CALCIUM 9.4 8.6* 8.6*  MG 1.9  --   --   AST 22 17 25   ALT 18 18 23   ALKPHOS 51 46 49  BILITOT 1.3* 1.6* 1.3*   ------------------------------------------------------------------------------------------------------------------ No results for input(s): CHOL, HDL, LDLCALC, TRIG, CHOLHDL, LDLDIRECT in the last 72 hours.  No results found for: HGBA1C ------------------------------------------------------------------------------------------------------------------ No results for input(s): TSH, T4TOTAL, T3FREE, THYROIDAB in the last 72 hours.  Invalid input(s): FREET3 ------------------------------------------------------------------------------------------------------------------ No results for input(s): VITAMINB12, FOLATE, FERRITIN, TIBC, IRON, RETICCTPCT in the last 72 hours.  Coagulation profile No results for input(s): INR, PROTIME in the last 168 hours.  No results for input(s): DDIMER in the last 72 hours.  Cardiac Enzymes No results for input(s): CKMB, TROPONINI, MYOGLOBIN in the last 168 hours.  Invalid input(s): CK ------------------------------------------------------------------------------------------------------------------ No results found for: BNP  Inpatient Medications  Scheduled Meds: . acetaminophen (TYLENOL)  oral liquid 160 mg/5 mL  650 mg Oral Q6H  . Chlorhexidine Gluconate  Cloth  6 each Topical Q0600  . enoxaparin (LOVENOX) injection  40 mg Subcutaneous Q24H  . mupirocin ointment  1 application Nasal BID  . nicotine  14 mg Transdermal Daily  . oxyCODONE  10 mg Oral Q4H  . pantoprazole (PROTONIX) IV  40 mg Intravenous Q12H   Continuous Infusions: . sodium chloride 125 mL/hr at 07/19/17 0806  . cefTRIAXone (ROCEPHIN)  IV Stopped (07/19/17 0420)  . metronidazole Stopped (07/19/17 0448)  . potassium chloride Stopped (07/19/17 1014)   PRN Meds:.HYDROmorphone (DILAUDID) injection, ondansetron **OR** ondansetron (ZOFRAN) IV, prochlorperazine  Micro Results Recent Results (from the past 240 hour(s))  MRSA PCR Screening     Status: Abnormal   Collection Time: 07/17/17  6:11 AM  Result Value Ref Range Status   MRSA by PCR POSITIVE (A) NEGATIVE Final    Comment:        The GeneXpert MRSA Assay (FDA approved for NASAL specimens only), is one component of a comprehensive MRSA colonization surveillance program. It is not intended to diagnose MRSA infection nor to guide or monitor treatment for MRSA infections. RESULT CALLED TO, READ BACK BY AND VERIFIED WITH: DILDY V. AT 0936A ON [id number] BY THOMPSON S.     Radiology Reports Ct Abdomen Pelvis W Contrast  Result Date: 07/19/2017 CLINICAL DATA:  Abdominal pain, gastroenteritis suspected, follow-up pneumatosis EXAM: CT ABDOMEN AND PELVIS WITH CONTRAST TECHNIQUE: Multidetector CT imaging of the abdomen and pelvis was performed using the standard protocol following bolus administration of intravenous contrast. CONTRAST:  169mL ISOVUE-300 IOPAMIDOL (ISOVUE-300) INJECTION 61% COMPARISON:  07/17/2017 FINDINGS: Lower chest: Lung bases are clear. Hepatobiliary: Liver is within normal limits. Status post cholecystectomy. No intrahepatic or extrahepatic ductal dilatation. Pancreas: Within normal limits. Spleen: Within normal limits. Adrenals/Urinary Tract: Adrenal glands within normal limits. Kidneys are within normal  limits.  No hydronephrosis. Bladder is mildly thick-walled although underdistended. Stomach/Bowel: Stomach is within normal limits. No evidence of bowel obstruction. No small bowel wall thickening or pneumatosis evident on the current study. Short segment jejunojejunal intussusception in the left mid abdomen (series 2/ image 31), likely transient/benign. Normal appendix (series 2/ image 58). No colonic wall thickening or inflammatory changes. Vascular/Lymphatic: No evidence of abdominal aortic aneurysm. No suspicious abdominopelvic lymphadenopathy. Reproductive: Prostate is unremarkable. Other: Trace pelvic ascites. No free air. Musculoskeletal: Visualized osseous structures are within normal limits. IMPRESSION: No small bowel wall thickening or pneumatosis is evident on the current study. No free air. Short-segment jejunojejunal intussusception in the left mid abdomen, likely transient/benign. Trace pelvic ascites, likely related to recent enteritis. Electronically Signed   By: Julian Hy M.D.   On: 07/19/2017 10:06   Ct Abdomen Pelvis W Contrast  Result Date: 07/17/2017 CLINICAL DATA:  31 y/o M; upper abdominal pain with nausea. History of small bowel obstruction. EXAM: CT ABDOMEN AND PELVIS WITH CONTRAST TECHNIQUE: Multidetector CT imaging of the abdomen and pelvis was performed using the standard protocol following bolus administration of intravenous contrast. CONTRAST:  131mL ISOVUE-300 IOPAMIDOL (ISOVUE-300) INJECTION 61% COMPARISON:  10/06/2015 CT of abdomen and pelvis. FINDINGS: Lower chest: No acute abnormality. Hepatobiliary: No focal liver abnormality is seen. Status post cholecystectomy. No biliary dilatation. Pancreas: Unremarkable. No pancreatic ductal dilatation or surrounding inflammatory changes. Spleen: Normal in size without focal abnormality. Adrenals/Urinary Tract: Adrenal glands are unremarkable. Kidneys are normal, without renal calculi, focal lesion, or hydronephrosis. Bladder is  unremarkable. Stomach/Bowel: Dilatation of the duodenum and proximal  jejunum with moderate wall thickening of proximal jejunum. Small focus of bowel wall pneumatosis (series 5, image 35), no pneumoperitoneum. Small bowel dilatation smoothly tapers to normal caliber within downstream distal jejunum and ileum demonstrate normal wall thickening. Normal colon. Normal appendix. Vascular/Lymphatic: No significant vascular findings are present. No enlarged abdominal or pelvic lymph nodes. Reproductive: Prostate is unremarkable. Other: No abdominal wall hernia or abnormality. No abdominopelvic ascites. Musculoskeletal: No acute or significant osseous findings. IMPRESSION: Duodenum and proximal jejunum moderate dilatation and wall thickening likely representing infectious/inflammatory enteritis. Small focus of bowel wall pneumatosis compatible with mucosal perforation, no pneumoperitoneum or abscess. Electronically Signed   By: Kristine Garbe M.D.   On: 07/17/2017 04:20   Dg Abdomen Acute W/chest  Result Date: 07/17/2017 CLINICAL DATA:  31 y/o  M; mid abdominal pain, diarrhea, nausea. EXAM: DG ABDOMEN ACUTE W/ 1V CHEST COMPARISON:  02/08/2015 abdomen radiographs FINDINGS: Dilated small bowel loops in left upper abdomen small bowel fluid levels. Right upper quadrant cholecystectomy clips. No radiopaque calculi or other significant radiographic abnormality is seen. Heart size and mediastinal contours are within normal limits. Both lungs are clear. IMPRESSION: Dilated loops of small bowel in left upper quadrant and small bowel fluid levels may represent obstruction or focal ileus. Electronically Signed   By: Kristine Garbe M.D.   On: 07/17/2017 03:43    Time Spent in minutes  25   Dervin Vore M.D on 07/19/2017 at 10:35 AM  Between 7am to 7pm - Pager - [phone]  After 7pm go to [email or web address] - password [facility]  [facility] -  Office  [phone]

## 2017-07-19 NOTE — Progress Notes (Signed)
[facility] Progress Note     Subjective: CT with contrast improved with no thickening and no pneumatosis, ? Small jejunal intussusceptions on one slice of CT.  Doing better and having BMs.   Objective: Vital signs in last 24 hours: Temp:  [97.9 F (36.6 C)-98.6 F (37 C)] 97.9 F (36.6 C) (12/07 0436) Pulse Rate:  [56-66] 65 (12/07 0436) Resp:  [16-20] 20 (12/07 0436) BP: (104-118)/(50-66) 118/66 (12/07 0436) SpO2:  [95 %-98 %] 98 % (12/07 0436) Last BM Date: 07/16/17  Intake/Output from previous day: 12/06 0701 - 12/07 0700 In: 3208.3 [I.V.:3008.3; IV Piggyback:200] Out: 600 [Urine:600] Intake/Output this shift: Total I/O In: 200 [IV Piggyback:200] Out: -   General appearance: alert, cooperative and no distress Resp: normal work breathing GI: soft, LUQ tenderness, no rebound or guarding; improving   Lab Results:  Recent Labs    07/18/17 0456 07/19/17 0359  WBC 5.9 6.2  HGB 13.9 13.9  HCT 42.6 42.9  PLT 289 319   BMET Recent Labs    07/18/17 0456 07/19/17 0359  NA 137 137  K 3.7 3.2*  CL 105 102  CO2 24 26  GLUCOSE 78 75  BUN 9 10  CREATININE 0.84 0.88  CALCIUM 8.6* 8.6*   PT/INR No results for input(s): LABPROT, INR in the last 72 hours.  Studies/Results: Ct Abdomen Pelvis W Contrast  Result Date: 07/19/2017 CLINICAL DATA:  Abdominal pain, gastroenteritis suspected, follow-up pneumatosis EXAM: CT ABDOMEN AND PELVIS WITH CONTRAST TECHNIQUE: Multidetector CT imaging of the abdomen and pelvis was performed using the standard protocol following bolus administration of intravenous contrast. CONTRAST:  18mL ISOVUE-300 IOPAMIDOL (ISOVUE-300) INJECTION 61% COMPARISON:  07/17/2017 FINDINGS: Lower chest: Lung bases are clear. Hepatobiliary: Liver is within normal limits. Status post cholecystectomy. No intrahepatic or extrahepatic ductal dilatation. Pancreas: Within normal limits. Spleen: Within normal limits. Adrenals/Urinary Tract: Adrenal  glands within normal limits. Kidneys are within normal limits.  No hydronephrosis. Bladder is mildly thick-walled although underdistended. Stomach/Bowel: Stomach is within normal limits. No evidence of bowel obstruction. No small bowel wall thickening or pneumatosis evident on the current study. Short segment jejunojejunal intussusception in the left mid abdomen (series 2/ image 31), likely transient/benign. Normal appendix (series 2/ image 58). No colonic wall thickening or inflammatory changes. Vascular/Lymphatic: No evidence of abdominal aortic aneurysm. No suspicious abdominopelvic lymphadenopathy. Reproductive: Prostate is unremarkable. Other: Trace pelvic ascites. No free air. Musculoskeletal: Visualized osseous structures are within normal limits. IMPRESSION: No small bowel wall thickening or pneumatosis is evident on the current study. No free air. Short-segment jejunojejunal intussusception in the left mid abdomen, likely transient/benign. Trace pelvic ascites, likely related to recent enteritis. Electronically Signed   By: Julian Hy M.D.   On: 07/19/2017 10:06    Anti-infectives: Anti-infectives (From admission, onward)   Start     Dose/Rate Route Frequency Ordered Stop   07/18/17 0500  cefTRIAXone (ROCEPHIN) 2 g in dextrose 5 % 50 mL IVPB     2 g 100 mL/hr over 30 Minutes Intravenous Every 24 hours 07/17/17 0544     07/17/17 1300  metroNIDAZOLE (FLAGYL) IVPB 500 mg     500 mg 100 mL/hr over 60 Minutes Intravenous Every 8 hours 07/17/17 0544     07/17/17 0445  cefTRIAXone (ROCEPHIN) 2 g in dextrose 5 % 50 mL IVPB     2 g 100 mL/hr over 30 Minutes Intravenous  Once 07/17/17 0431 07/17/17 0530   07/17/17 0445  metroNIDAZOLE (FLAGYL) IVPB 500 mg  500 mg 100 mL/hr over 60 Minutes Intravenous  Once 07/17/17 0431 07/17/17 0630      Assessment/Plan: Mr. Seibold is a 31 yo with a acute on chronic abdominal pain of unknown etiology with duodenitis and jejunitis and pneumatosis of the  jejunum on CT and resolved on repeat CT. Area of intussusception likely transient, do not think related to pathology but could be that there is a lead point that will need further investigation in the future. Over all doing better. Abd pain better.  -Protonix BID -Clear diet, can adv to full liquid and then soft if no worsening pain -Continue antibiotics, would d/c home on oral antibiotics to complete 7 day course   -Plan as outpatient for EGD and capsule study with Dr. Laural Golden. If these are negative will get CT enterography to look at the bowel further, esp more distal jejunum, and if this is negative will discuss diagnostic laparoscopy.  If diagnostic laparoscopy was negative and no etiology identified, could discuss celiac block for pain control but this would not give [place] the etiology of the pain just symptomatic control.   -If doing better tomorrow, pain controlled on orals, and tolerating diet, having Bms can go home with close follow up.  -Dr. Laural Golden out of town and GI and myself will inform him about this patient on his return.    LOS: 2 days    Virl Cagey 07/19/2017

## 2017-07-20 DIAGNOSIS — E860 Dehydration: Secondary | ICD-10-CM

## 2017-07-20 DIAGNOSIS — K529 Noninfective gastroenteritis and colitis, unspecified: Secondary | ICD-10-CM | POA: Diagnosis present

## 2017-07-20 DIAGNOSIS — E876 Hypokalemia: Secondary | ICD-10-CM | POA: Diagnosis not present

## 2017-07-20 DIAGNOSIS — Z720 Tobacco use: Secondary | ICD-10-CM | POA: Diagnosis present

## 2017-07-20 LAB — CBC WITH DIFFERENTIAL/PLATELET
BASOS ABS: 0 10*3/uL (ref 0.0–0.1)
BASOS PCT: 0 %
EOS ABS: 0.2 10*3/uL (ref 0.0–0.7)
EOS PCT: 5 %
HCT: 41 % (ref 39.0–52.0)
Hemoglobin: 13.5 g/dL (ref 13.0–17.0)
Lymphocytes Relative: 22 %
Lymphs Abs: 1 10*3/uL (ref 0.7–4.0)
MCH: 29.3 pg (ref 26.0–34.0)
MCHC: 32.9 g/dL (ref 30.0–36.0)
MCV: 89.1 fL (ref 78.0–100.0)
MONO ABS: 0.5 10*3/uL (ref 0.1–1.0)
Monocytes Relative: 11 %
NEUTROS ABS: 2.8 10*3/uL (ref 1.7–7.7)
Neutrophils Relative %: 62 %
PLATELETS: 301 10*3/uL (ref 150–400)
RBC: 4.6 MIL/uL (ref 4.22–5.81)
RDW: 12.9 % (ref 11.5–15.5)
WBC: 4.6 10*3/uL (ref 4.0–10.5)

## 2017-07-20 MED ORDER — OMEPRAZOLE 40 MG PO CPDR: 40.0000 mg | DELAYED_RELEASE_CAPSULE | Freq: Every day | ORAL | 0 refills | Status: DC

## 2017-07-20 MED ORDER — CIPROFLOXACIN HCL 500 MG PO TABS: 500.0000 mg | ORAL_TABLET | Freq: Two times a day (BID) | ORAL | 0 refills | Status: AC

## 2017-07-20 MED ORDER — METRONIDAZOLE 500 MG PO TABS: 500.0000 mg | ORAL_TABLET | Freq: Three times a day (TID) | ORAL | 0 refills | Status: AC

## 2017-07-20 MED ORDER — ONDANSETRON HCL 4 MG PO TABS: 4.0000 mg | ORAL_TABLET | Freq: Three times a day (TID) | ORAL | 0 refills | Status: DC | PRN

## 2017-07-20 MED ORDER — NICOTINE 14 MG/24HR TD PT24: 14.0000 mg | MEDICATED_PATCH | Freq: Every day | TRANSDERMAL | 0 refills | Status: DC

## 2017-07-20 MED ORDER — OXYCODONE-ACETAMINOPHEN 10-325 MG PO TABS: 1.0000 | ORAL_TABLET | Freq: Four times a day (QID) | ORAL | 0 refills | Status: DC | PRN

## 2017-07-20 NOTE — Progress Notes (Signed)
[facility] Progress Note     Subjective: Pain better. Feeling ok. Tolerated fulls and having BM. Overall improved.   Objective: Vital signs in last 24 hours: Temp:  [97 F (36.1 C)-98.1 F (36.7 C)] 97 F (36.1 C) (12/08 0500) Pulse Rate:  [47-64] 47 (12/08 0500) BP: (113-116)/(49-55) 116/49 (12/08 0500) SpO2:  [96 %-97 %] 97 % (12/08 0500) Last BM Date: 07/16/17  Intake/Output from previous day: 12/07 0701 - 12/08 0700 In: 3686.7 [P.O.:480; I.V.:2406.7; IV Piggyback:800] Out: 982 [Urine:981; Stool:1] Intake/Output this shift: No intake/output data recorded.  General appearance: alert, cooperative and no distress Resp: normal work breathing GI: soft, less tender LUQ, no rebound or guarding  Lab Results:  Recent Labs    07/19/17 0359 07/20/17 0556  WBC 6.2 4.6  HGB 13.9 13.5  HCT 42.9 41.0  PLT 319 301   BMET Recent Labs    07/18/17 0456 07/19/17 0359  NA 137 137  K 3.7 3.2*  CL 105 102  CO2 24 26  GLUCOSE 78 75  BUN 9 10  CREATININE 0.84 0.88  CALCIUM 8.6* 8.6*    Studies/Results: Ct Abdomen Pelvis W Contrast  Result Date: 07/19/2017 CLINICAL DATA:  Abdominal pain, gastroenteritis suspected, follow-up pneumatosis EXAM: CT ABDOMEN AND PELVIS WITH CONTRAST TECHNIQUE: Multidetector CT imaging of the abdomen and pelvis was performed using the standard protocol following bolus administration of intravenous contrast. CONTRAST:  1109mL ISOVUE-300 IOPAMIDOL (ISOVUE-300) INJECTION 61% COMPARISON:  07/17/2017 FINDINGS: Lower chest: Lung bases are clear. Hepatobiliary: Liver is within normal limits. Status post cholecystectomy. No intrahepatic or extrahepatic ductal dilatation. Pancreas: Within normal limits. Spleen: Within normal limits. Adrenals/Urinary Tract: Adrenal glands within normal limits. Kidneys are within normal limits.  No hydronephrosis. Bladder is mildly thick-walled although underdistended. Stomach/Bowel: Stomach is within normal limits.  No evidence of bowel obstruction. No small bowel wall thickening or pneumatosis evident on the current study. Short segment jejunojejunal intussusception in the left mid abdomen (series 2/ image 31), likely transient/benign. Normal appendix (series 2/ image 58). No colonic wall thickening or inflammatory changes. Vascular/Lymphatic: No evidence of abdominal aortic aneurysm. No suspicious abdominopelvic lymphadenopathy. Reproductive: Prostate is unremarkable. Other: Trace pelvic ascites. No free air. Musculoskeletal: Visualized osseous structures are within normal limits. IMPRESSION: No small bowel wall thickening or pneumatosis is evident on the current study. No free air. Short-segment jejunojejunal intussusception in the left mid abdomen, likely transient/benign. Trace pelvic ascites, likely related to recent enteritis. Electronically Signed   By: Julian Hy M.D.   On: 07/19/2017 10:06    Anti-infectives: Anti-infectives (From admission, onward)   Start     Dose/Rate Route Frequency Ordered Stop   07/20/17 0000  ciprofloxacin (CIPRO) 500 MG tablet     500 mg Oral 2 times daily 07/20/17 1030 07/25/17 2359   07/20/17 0000  metroNIDAZOLE (FLAGYL) 500 MG tablet     500 mg Oral 3 times daily 07/20/17 1030 07/25/17 2359   07/18/17 0500  cefTRIAXone (ROCEPHIN) 2 g in dextrose 5 % 50 mL IVPB     2 g 100 mL/hr over 30 Minutes Intravenous Every 24 hours 07/17/17 0544     07/17/17 1300  metroNIDAZOLE (FLAGYL) IVPB 500 mg     500 mg 100 mL/hr over 60 Minutes Intravenous Every 8 hours 07/17/17 0544     07/17/17 0445  cefTRIAXone (ROCEPHIN) 2 g in dextrose 5 % 50 mL IVPB     2 g 100 mL/hr over 30 Minutes Intravenous  Once 07/17/17 0431 07/17/17 0530  07/17/17 0445  metroNIDAZOLE (FLAGYL) IVPB 500 mg     500 mg 100 mL/hr over 60 Minutes Intravenous  Once 07/17/17 0431 07/17/17 0630      Assessment/Plan: [name] [name] is a 31 yo with a acute on chronic abdominal pain of unknown etiology with  duodenitis and jejunitis and pneumatosis of the jejunum on CT and resolved on repeat CT. Doing better and tolerating a diet. Having BMs. -Protonix BID -Complete antibiotic course 7-10 days total  -Plan as outpatient for EGD and capsule study with Dr. Laural Golden. If these are negative will get CT enterography to look at the bowel further, esp more distal jejunum, and if this is negative will discuss diagnostic laparoscopy.  If diagnostic laparoscopy was negative and no etiology identified, could discuss celiac block for pain control but this would not give [place] the etiology of the pain just symptomatic control.   D/c Home and I plan to see in the clinic 07/30/17. Call for an appointment.   LOS: 3 days    Virl Cagey 07/20/2017

## 2017-07-20 NOTE — Discharge Summary (Signed)
Physician Discharge Summary  [name] [name] ZCH:[id number] DOB: [birth year] DOA: 07/17/2017  PCP: Redmond School, MD  Admit date: 07/17/2017 Discharge date: 07/20/2017  Admitted From: Home Disposition:  Home  Recommendations for Outpatient Follow-up:  1. Follow-up with PCP in one week 2. Outpatient follow-up with GI And surgery (office will call with appointment)  Home Health:None Equipment/Devices:None  Discharge Condition:Fair CODE STATUS:Full code Diet recommendation: Soft    Discharge Diagnoses:  Principal Problem:   Colitis   Active Problems:   Nausea vomiting and diarrhea   Perforation of intestine (HCC)   Abnormal CT scan, small bowel   Epigastric abdominal pain   Diarrhea of presumed infectious origin   Intussusception ([place])   Dehydration   Enteritis   Hypokalemia   Tobacco abuse   Brief narrative/history of present illness Please refer to admission H&P for details, in brief, 31 year old male with history of umbilical hernia repair and cholecystectomy about 12 years back, chronic abdominal pain (previously on narcotics) presented with increasing 4 day history of epigastric pain. This was associated with watery diarrhea and a few episodes of vomiting. CT of the abdomen and pelvis in the ED showed moderate dilatation of duodenal lumen proximal jejunum with possible enteritis. Also showed small focus of bowel wall pneumatosis with mucosal perforation without pneumoperitoneum or abscess.  Hospital course  Principal Problem:   Colitis With? Pneumatosis and mucosal perforation Persistent pain requiring escalation of Pain medication and patient kept nothing by mouth. Follow-up CT scan with by mouth and IV contrast showing no small bowel wall thickening or pneumatosis. There is a short segment jejunojejunal intussusception in the left mid abdomen which is commended air is a transient or benign. Pain better controlled with Pain medication made schedule and adjusted. Diet  advanced slowly and tolerating well. GI and surgery consult appreciated. GI Plan on Push enteroscopy versus capsule study as outpatient. If Still undiagnosed may need laparoscopy evaluation.  Patient clinically stable to be discharged home with outpatient GI and surgery follow-up. He will be discharged on oral ciprofloxacin and Flagyl to complete 7-8 days of antibiotics.  Hypokalemia Replenished  Tobacco abuse Counseled on cessation. Nicotine patch Prescribed.     Family Communication  : Mother at bedside  Disposition Plan  : Home   Consults  :   Surgery GI  Procedures  :  CT abdomen pelvis     Discharge Instructions   Allergies as of 07/20/2017   No Known Allergies     Medication List    STOP taking these medications   oxyCODONE-acetaminophen 7.5-325 MG tablet Commonly known as:  PERCOCET Replaced by:  oxyCODONE-acetaminophen 10-325 MG tablet   sucralfate 1 GM/10ML suspension Commonly known as:  CARAFATE     TAKE these medications   ciprofloxacin 500 MG tablet Commonly known as:  CIPRO Take 1 tablet (500 mg total) by mouth 2 (two) times daily for 5 days.   metroNIDAZOLE 500 MG tablet Commonly known as:  FLAGYL Take 1 tablet (500 mg total) by mouth 3 (three) times daily for 5 days.   nicotine 14 mg/24hr patch Commonly known as:  NICODERM CQ - dosed in mg/24 hours Place 1 patch (14 mg total) onto the skin daily.   omeprazole 40 MG capsule Commonly known as:  PRILOSEC Take 1 capsule (40 mg total) by mouth daily. What changed:    medication strength  how much to take   ondansetron 4 MG tablet Commonly known as:  ZOFRAN Take 1 tablet (4 mg total) by mouth every 8 (  eight) hours as needed for nausea or vomiting.   oxyCODONE-acetaminophen 10-325 MG tablet Commonly known as:  PERCOCET Take 1 tablet by mouth every 6 (six) hours as needed for pain. Replaces:  oxyCODONE-acetaminophen 7.5-325 MG tablet      Follow-up Information    Redmond School, MD. Schedule an appointment as soon as possible for a visit in 1 week(s).   Specialty:  Internal Medicine Contact information: [address] [place] [address] [phone]        Daneil Dolin, MD Follow up in 2 week(s).   Specialty:  Gastroenterology Why:  office will call Contact information: [address] [place] [address] [phone]        Virl Cagey, MD Follow up in 2 week(s).   Specialty:  General Surgery Why:  has appt scheduled already Contact information: [address] Dr Linna Hoff [facility] [address] [phone]          No Known Allergies   Procedures/Studies: Ct Abdomen Pelvis W Contrast  Result Date: 07/19/2017 CLINICAL DATA:  Abdominal pain, gastroenteritis suspected, follow-up pneumatosis EXAM: CT ABDOMEN AND PELVIS WITH CONTRAST TECHNIQUE: Multidetector CT imaging of the abdomen and pelvis was performed using the standard protocol following bolus administration of intravenous contrast. CONTRAST:  115mL ISOVUE-300 IOPAMIDOL (ISOVUE-300) INJECTION 61% COMPARISON:  07/17/2017 FINDINGS: Lower chest: Lung bases are clear. Hepatobiliary: Liver is within normal limits. Status post cholecystectomy. No intrahepatic or extrahepatic ductal dilatation. Pancreas: Within normal limits. Spleen: Within normal limits. Adrenals/Urinary Tract: Adrenal glands within normal limits. Kidneys are within normal limits.  No hydronephrosis. Bladder is mildly thick-walled although underdistended. Stomach/Bowel: Stomach is within normal limits. No evidence of bowel obstruction. No small bowel wall thickening or pneumatosis evident on the current study. Short segment jejunojejunal intussusception in the left mid abdomen (series 2/ image 31), likely transient/benign. Normal appendix (series 2/ image 58). No colonic wall thickening or inflammatory changes. Vascular/Lymphatic: No evidence of abdominal aortic aneurysm. No suspicious abdominopelvic  lymphadenopathy. Reproductive: Prostate is unremarkable. Other: Trace pelvic ascites. No free air. Musculoskeletal: Visualized osseous structures are within normal limits. IMPRESSION: No small bowel wall thickening or pneumatosis is evident on the current study. No free air. Short-segment jejunojejunal intussusception in the left mid abdomen, likely transient/benign. Trace pelvic ascites, likely related to recent enteritis. Electronically Signed   By: Julian Hy M.D.   On: 07/19/2017 10:06   Ct Abdomen Pelvis W Contrast  Result Date: 07/17/2017 CLINICAL DATA:  31 y/o M; upper abdominal pain with nausea. History of small bowel obstruction. EXAM: CT ABDOMEN AND PELVIS WITH CONTRAST TECHNIQUE: Multidetector CT imaging of the abdomen and pelvis was performed using the standard protocol following bolus administration of intravenous contrast. CONTRAST:  120mL ISOVUE-300 IOPAMIDOL (ISOVUE-300) INJECTION 61% COMPARISON:  10/06/2015 CT of abdomen and pelvis. FINDINGS: Lower chest: No acute abnormality. Hepatobiliary: No focal liver abnormality is seen. Status post cholecystectomy. No biliary dilatation. Pancreas: Unremarkable. No pancreatic ductal dilatation or surrounding inflammatory changes. Spleen: Normal in size without focal abnormality. Adrenals/Urinary Tract: Adrenal glands are unremarkable. Kidneys are normal, without renal calculi, focal lesion, or hydronephrosis. Bladder is unremarkable. Stomach/Bowel: Dilatation of the duodenum and proximal jejunum with moderate wall thickening of proximal jejunum. Small focus of bowel wall pneumatosis (series 5, image 35), no pneumoperitoneum. Small bowel dilatation smoothly tapers to normal caliber within downstream distal jejunum and ileum demonstrate normal wall thickening. Normal colon. Normal appendix. Vascular/Lymphatic: No significant vascular findings are present. No enlarged abdominal or pelvic lymph nodes. Reproductive: Prostate is unremarkable. Other:  No  abdominal wall hernia or abnormality. No abdominopelvic ascites. Musculoskeletal: No acute or significant osseous findings. IMPRESSION: Duodenum and proximal jejunum moderate dilatation and wall thickening likely representing infectious/inflammatory enteritis. Small focus of bowel wall pneumatosis compatible with mucosal perforation, no pneumoperitoneum or abscess. Electronically Signed   By: Kristine Garbe M.D.   On: 07/17/2017 04:20   Dg Abdomen Acute W/chest  Result Date: 07/17/2017 CLINICAL DATA:  31 y/o  M; mid abdominal pain, diarrhea, nausea. EXAM: DG ABDOMEN ACUTE W/ 1V CHEST COMPARISON:  02/08/2015 abdomen radiographs FINDINGS: Dilated small bowel loops in left upper abdomen small bowel fluid levels. Right upper quadrant cholecystectomy clips. No radiopaque calculi or other significant radiographic abnormality is seen. Heart size and mediastinal contours are within normal limits. Both lungs are clear. IMPRESSION: Dilated loops of small bowel in left upper quadrant and small bowel fluid levels may represent obstruction or focal ileus. Electronically Signed   By: Kristine Garbe M.D.   On: 07/17/2017 03:43       Subjective: No nausea or vomiting. Afebrile. Abdominal pain slightly Improved on current pain regimen.  Discharge Exam: Vitals:   07/19/17 2149 07/20/17 0500  BP: (!) 113/55 (!) 116/49  Pulse: 64 (!) 47  Resp:    Temp: 98.1 F (36.7 C) (!) 97 F (36.1 C)  SpO2: 96% 97%   Vitals:   07/19/17 0436 07/19/17 1416 07/19/17 2149 07/20/17 0500  BP: 118/66 (!) 105/52 (!) 113/55 (!) 116/49  Pulse: 65 (!) 56 64 (!) 47  Resp: 20     Temp: 97.9 F (36.6 C) 99 F (37.2 C) 98.1 F (36.7 C) (!) 97 F (36.1 C)  TempSrc: Oral Axillary Oral Oral  SpO2: 98% 98% 96% 97%  Weight:      Height:       Gen.: Not in distress  HEENT: Moist mucosa, supple neck chest: Clear bilaterally  YQM:VHQION S1 and S2, no murmurs GI: Soft, bowel sounds present, nondistended,  epigastric and periumbilical tenderness (improved from previous days) Musculoskeletal: Warm, no edema      The results of significant diagnostics from this hospitalization (including imaging, microbiology, ancillary and laboratory) are listed below for reference.     Microbiology: Recent Results (from the past 240 hour(s))  MRSA PCR Screening     Status: Abnormal   Collection Time: 07/17/17  6:11 AM  Result Value Ref Range Status   MRSA by PCR POSITIVE (A) NEGATIVE Final    Comment:        The GeneXpert MRSA Assay (FDA approved for NASAL specimens only), is one component of a comprehensive MRSA colonization surveillance program. It is not intended to diagnose MRSA infection nor to guide or monitor treatment for MRSA infections. RESULT CALLED TO, READ BACK BY AND VERIFIED WITH: DILDY V. AT 0936A ON [id number] BY THOMPSON S.      Labs: BNP (last 3 results) No results for input(s): BNP in the last 8760 hours. Basic Metabolic Panel: Recent Labs  Lab 07/17/17 0300 07/18/17 0456 07/19/17 0359  NA 139 137 137  K 3.5 3.7 3.2*  CL 106 105 102  CO2 25 24 26   GLUCOSE 107* 78 75  BUN 11 9 10   CREATININE 0.90 0.84 0.88  CALCIUM 9.4 8.6* 8.6*  MG 1.9  --   --    Liver Function Tests: Recent Labs  Lab 07/17/17 0300 07/18/17 0456 07/19/17 0359  AST 22 17 25   ALT 18 18 23   ALKPHOS 51 46 49  BILITOT 1.3* 1.6*  1.3*  PROT 7.9 6.4* 6.8  ALBUMIN 4.6 3.8 4.2   Recent Labs  Lab 07/17/17 0300 07/19/17 0359  LIPASE 31 30   No results for input(s): AMMONIA in the last 168 hours. CBC: Recent Labs  Lab 07/17/17 0300 07/18/17 0456 07/19/17 0359 07/20/17 0556  WBC 7.8 5.9 6.2 4.6  NEUTROABS 5.5 3.7 3.0 2.8  HGB 15.1 13.9 13.9 13.5  HCT 45.1 42.6 42.9 41.0  MCV 89.5 89.7 89.4 89.1  PLT 327 289 319 301   Cardiac Enzymes: No results for input(s): CKTOTAL, CKMB, CKMBINDEX, TROPONINI in the last 168 hours. BNP: Invalid input(s): POCBNP CBG: No results for input(s):  GLUCAP in the last 168 hours. D-Dimer No results for input(s): DDIMER in the last 72 hours. Hgb A1c No results for input(s): HGBA1C in the last 72 hours. Lipid Profile No results for input(s): CHOL, HDL, LDLCALC, TRIG, CHOLHDL, LDLDIRECT in the last 72 hours. Thyroid function studies No results for input(s): TSH, T4TOTAL, T3FREE, THYROIDAB in the last 72 hours.  Invalid input(s): FREET3 Anemia work up No results for input(s): VITAMINB12, FOLATE, FERRITIN, TIBC, IRON, RETICCTPCT in the last 72 hours. Urinalysis    Component Value Date/Time   COLORURINE YELLOW 02/06/2015 1129   APPEARANCEUR CLEAR 02/06/2015 1129   LABSPEC <1.005 (L) 02/06/2015 1129   PHURINE 5.0 02/06/2015 1129   GLUCOSEU NEGATIVE 02/06/2015 1129   HGBUR SMALL (A) 02/06/2015 1129   BILIRUBINUR NEGATIVE 02/06/2015 1129   KETONESUR NEGATIVE 02/06/2015 1129   PROTEINUR NEGATIVE 02/06/2015 1129   UROBILINOGEN 0.2 02/06/2015 1129   NITRITE NEGATIVE 02/06/2015 1129   LEUKOCYTESUR NEGATIVE 02/06/2015 1129   Sepsis Labs Invalid input(s): PROCALCITONIN,  WBC,  LACTICIDVEN Microbiology Recent Results (from the past 240 hour(s))  MRSA PCR Screening     Status: Abnormal   Collection Time: 07/17/17  6:11 AM  Result Value Ref Range Status   MRSA by PCR POSITIVE (A) NEGATIVE Final    Comment:        The GeneXpert MRSA Assay (FDA approved for NASAL specimens only), is one component of a comprehensive MRSA colonization surveillance program. It is not intended to diagnose MRSA infection nor to guide or monitor treatment for MRSA infections. RESULT CALLED TO, READ BACK BY AND VERIFIED WITH: DILDY V. AT 0936A ON [id number] BY THOMPSON S.      Time coordinating discharge: Over 30 minutes  SIGNED:   Louellen Molder, MD  [facility] 07/20/2017, 10:32 AM Pager   If 7PM-7AM, please contact night-coverage [email or web address] Password [email or web address]

## 2017-07-20 NOTE — Progress Notes (Signed)
Patient discharged home with personal belongings. IV removed and intact. Patient discharged with all prescriptions.

## 2017-07-25 ENCOUNTER — Encounter (INDEPENDENT_AMBULATORY_CARE_PROVIDER_SITE_OTHER): Payer: Self-pay | Admitting: Internal Medicine

## 2017-07-25 ENCOUNTER — Encounter (INDEPENDENT_AMBULATORY_CARE_PROVIDER_SITE_OTHER): Payer: Self-pay | Admitting: *Deleted

## 2017-07-25 ENCOUNTER — Ambulatory Visit (INDEPENDENT_AMBULATORY_CARE_PROVIDER_SITE_OTHER): Payer: 59 | Admitting: Internal Medicine

## 2017-07-25 VITALS — BP 108/72 | HR 64 | Temp 98.4°F | Ht 70.0 in | Wt 200.5 lb

## 2017-07-25 DIAGNOSIS — K529 Noninfective gastroenteritis and colitis, unspecified: Secondary | ICD-10-CM | POA: Diagnosis not present

## 2017-07-25 DIAGNOSIS — R1084 Generalized abdominal pain: Secondary | ICD-10-CM | POA: Diagnosis not present

## 2017-07-25 NOTE — Progress Notes (Signed)
Subjective:    Patient ID: [name] [name], male    DOB: 09/11/1985, 31 y.o.   MRN: [id number]  HPI Referred by Dr. Gerarda Fraction for hospital f/u.  Recently admitted to [facility] with abdominal pain, nausea and vomiting, diarrhea. Underwent a CT 07/17/2017  which revealed:  IMPRESSION: Duodenum and proximal jejunum moderate dilatation and wall thickening likely representing infectious/inflammatory enteritis. Small focus of bowel wall pneumatosis compatible with mucosal  perforation, no pneumoperitoneum or abscess.  07/19/2017  CT abdomen/pelvis with CM:  IMPRESSION: No small bowel wall thickening or pneumatosis is evident on theurrent study. No free air. Short-segment jejunojejunal intussusception in the left mid abdomen,likely transient/benign. Trace pelvic ascites, likely related to recent enteritis.   Was covered with Rocephin and  During admission.  He was evaluated by Dr. Gala Romney . Per Dr. Roseanne Kaufman noted will eventually need further evaluation of the small bowel possible enteroscopy versus small bowel capsule study.  Discharge with Cipro and Flagyl x 5 days. Also evaluated by Dr. Blake Divine who was covering for Dr. Arnoldo Morale.     Today he states he is having abdominal pain which is normal for him. States the pain is normal for him. He is having about 5-6 BMs a day. No rectal bleeding. Stools are loose.  He is eating okay. Doesn't have much of a appetite.  He points to his epigastric region.  States his pain level is at a 7 now.  No NSAIDs.   Seen by Dr. Laural Golden in 2015 for abdominal pain, nausea,vomiting, and abnormal CT.  Dr. Laural Golden advised surgical consult.    Hx of umbical hernia with mesh. Hx of cholecystectomy.     07/17/2017 Gastrin level normal. CBC    Component Value Date/Time   WBC 4.6 07/20/2017 0556   RBC 4.60 07/20/2017 0556   HGB 13.5 07/20/2017 0556   HCT 41.0 07/20/2017 0556   PLT 301 07/20/2017 0556   MCV 89.1 07/20/2017 0556   MCH 29.3 07/20/2017 0556   MCHC 32.9 07/20/2017  0556   RDW 12.9 07/20/2017 0556   LYMPHSABS 1.0 07/20/2017 0556   MONOABS 0.5 07/20/2017 0556   EOSABS 0.2 07/20/2017 0556   BASOSABS 0.0 07/20/2017 0556   CBC    Component Value Date/Time   WBC 4.6 07/20/2017 0556   RBC 4.60 07/20/2017 0556   HGB 13.5 07/20/2017 0556   HCT 41.0 07/20/2017 0556   PLT 301 07/20/2017 0556   MCV 89.1 07/20/2017 0556   MCH 29.3 07/20/2017 0556   MCHC 32.9 07/20/2017 0556   RDW 12.9 07/20/2017 0556   LYMPHSABS 1.0 07/20/2017 0556   MONOABS 0.5 07/20/2017 0556   EOSABS 0.2 07/20/2017 0556   BASOSABS 0.0 07/20/2017 0556   HIV negative.       Review of Systems Past Medical History:  Diagnosis Date  . History of Legg-Calve-Perthes disease   . Small bowel obstruction [facility])     Past Surgical History:  Procedure Laterality Date  . ABDOMINAL SURGERY    . CHOLECYSTECTOMY    . HERNIA REPAIR    . HIP ARTHROSCOPY  2018   right hip    No Known Allergies  Current Outpatient Medications on File Prior to Visit  Medication Sig Dispense Refill  . ciprofloxacin (CIPRO) 500 MG tablet Take 1 tablet (500 mg total) by mouth 2 (two) times daily for 5 days. 10 tablet 0  . metroNIDAZOLE (FLAGYL) 500 MG tablet Take 1 tablet (500 mg total) by mouth 3 (three) times daily for 5 days. 15  tablet 0  . nicotine (NICODERM CQ - DOSED IN MG/24 HOURS) 14 mg/24hr patch Place 1 patch (14 mg total) onto the skin daily. 28 patch 0  . omeprazole (PRILOSEC) 40 MG capsule Take 1 capsule (40 mg total) by mouth daily. 30 capsule 0  . ondansetron (ZOFRAN) 4 MG tablet Take 1 tablet (4 mg total) by mouth every 8 (eight) hours as needed for nausea or vomiting. 12 tablet 0  . oxyCODONE-acetaminophen (PERCOCET) 10-325 MG tablet Take 1 tablet by mouth every 6 (six) hours as needed for pain. 24 tablet 0   No current facility-administered medications on file prior to visit.         Objective:   Physical Exam Blood pressure 108/72, pulse 64, temperature 98.4 F (36.9 C), height 5'  10" (1.778 m), weight 200 lb 8 oz (90.9 kg). Alert and oriented. Skin warm and dry. Oral mucosa is moist.   . Sclera anicteric, conjunctivae is pink. Thyroid not enlarged. No cervical lymphadenopathy. Lungs clear. Heart regular rate and rhythm.  Abdomen is soft. Bowel sounds are positive. No hepatomegaly. No abdominal masses felt. Epigastric tenderness.   No edema to lower extremities.           Assessment & Plan:  Abdominal pain/epigastric pain.  CT  abdomen and pelvis in the ED showed moderate dilatation of duodenal lumen proximal jejunum with possible enteritis. Also showed small focus of bowel wall pneumatosis with mucosal perforation without pneumoperitoneum or abscess. I will discuss with Dr. Laural Golden. I have placed chart on his desk for him to review.

## 2017-07-25 NOTE — Patient Instructions (Signed)
Labs, CT abdomen/pelvis with CM.

## 2017-07-26 ENCOUNTER — Emergency Department (HOSPITAL_COMMUNITY)
Admission: EM | Admit: 2017-07-26 | Discharge: 2017-07-26 | Disposition: A | Discharge status: Home / Self Care | Payer: 59 | Attending: Emergency Medicine | Admitting: Emergency Medicine

## 2017-07-26 ENCOUNTER — Other Ambulatory Visit (INDEPENDENT_AMBULATORY_CARE_PROVIDER_SITE_OTHER): Payer: Self-pay | Admitting: Internal Medicine

## 2017-07-26 ENCOUNTER — Encounter (HOSPITAL_COMMUNITY): Payer: Self-pay

## 2017-07-26 DIAGNOSIS — Z79899 Other long term (current) drug therapy: Secondary | ICD-10-CM | POA: Diagnosis not present

## 2017-07-26 DIAGNOSIS — F17210 Nicotine dependence, cigarettes, uncomplicated: Secondary | ICD-10-CM | POA: Insufficient documentation

## 2017-07-26 DIAGNOSIS — R109 Unspecified abdominal pain: Secondary | ICD-10-CM

## 2017-07-26 DIAGNOSIS — R1010 Upper abdominal pain, unspecified: Secondary | ICD-10-CM | POA: Diagnosis present

## 2017-07-26 DIAGNOSIS — R1013 Epigastric pain: Secondary | ICD-10-CM | POA: Diagnosis not present

## 2017-07-26 LAB — COMPREHENSIVE METABOLIC PANEL
ALT: 29 U/L (ref 17–63)
AST: 21 U/L (ref 15–41)
Albumin: 4.4 g/dL (ref 3.5–5.0)
Alkaline Phosphatase: 44 U/L (ref 38–126)
Anion gap: 5 (ref 5–15)
BUN: 14 mg/dL (ref 6–20)
CHLORIDE: 104 mmol/L (ref 101–111)
CO2: 29 mmol/L (ref 22–32)
CREATININE: 0.96 mg/dL (ref 0.61–1.24)
Calcium: 9 mg/dL (ref 8.9–10.3)
Glucose, Bld: 97 mg/dL (ref 65–99)
POTASSIUM: 3.8 mmol/L (ref 3.5–5.1)
Sodium: 138 mmol/L (ref 135–145)
Total Bilirubin: 0.7 mg/dL (ref 0.3–1.2)
Total Protein: 7.2 g/dL (ref 6.5–8.1)

## 2017-07-26 LAB — CBC WITH DIFFERENTIAL/PLATELET
BASOS ABS: 28 {cells}/uL (ref 0–200)
Basophils Relative: 0.5 %
EOS PCT: 2.9 %
Eosinophils Absolute: 162 cells/uL (ref 15–500)
HCT: 40.2 % (ref 38.5–50.0)
Hemoglobin: 13.8 g/dL (ref 13.2–17.1)
Lymphs Abs: 1518 cells/uL (ref 850–3900)
MCH: 29.4 pg (ref 27.0–33.0)
MCHC: 34.3 g/dL (ref 32.0–36.0)
MCV: 85.7 fL (ref 80.0–100.0)
MONOS PCT: 9 %
MPV: 10.6 fL (ref 7.5–12.5)
NEUTROS ABS: 3388 {cells}/uL (ref 1500–7800)
NEUTROS PCT: 60.5 %
PLATELETS: 346 10*3/uL (ref 140–400)
RBC: 4.69 10*6/uL (ref 4.20–5.80)
RDW: 12.9 % (ref 11.0–15.0)
Total Lymphocyte: 27.1 %
WBC mixed population: 504 cells/uL (ref 200–950)
WBC: 5.6 10*3/uL (ref 3.8–10.8)

## 2017-07-26 LAB — CBC
HEMATOCRIT: 43.7 % (ref 39.0–52.0)
Hemoglobin: 14.3 g/dL (ref 13.0–17.0)
MCH: 29.4 pg (ref 26.0–34.0)
MCHC: 32.7 g/dL (ref 30.0–36.0)
MCV: 89.9 fL (ref 78.0–100.0)
PLATELETS: 341 10*3/uL (ref 150–400)
RBC: 4.86 MIL/uL (ref 4.22–5.81)
RDW: 13.4 % (ref 11.5–15.5)
WBC: 5.6 10*3/uL (ref 4.0–10.5)

## 2017-07-26 LAB — LIPASE, BLOOD: LIPASE: 28 U/L (ref 11–51)

## 2017-07-26 LAB — SEDIMENTATION RATE: SED RATE: 2 mm/h (ref 0–15)

## 2017-07-26 MED ORDER — HYDROMORPHONE HCL 4 MG PO TABS: 4.0000 mg | ORAL_TABLET | ORAL | 0 refills | Status: DC | PRN

## 2017-07-26 MED ORDER — ONDANSETRON HCL 4 MG/2ML IJ SOLN
4.0000 mg | Freq: Once | INTRAMUSCULAR | Status: AC
  Administered 2017-07-26: 4 mg via INTRAVENOUS
  Filled 2017-07-26: qty 2

## 2017-07-26 MED ORDER — HYDROMORPHONE HCL 1 MG/ML IJ SOLN
1.0000 mg | Freq: Once | INTRAMUSCULAR | Status: DC
  Filled 2017-07-26: qty 1

## 2017-07-26 MED ORDER — HYDROMORPHONE HCL 1 MG/ML IJ SOLN
1.0000 mg | Freq: Once | INTRAMUSCULAR | Status: AC
  Administered 2017-07-26: 1 mg via INTRAVENOUS
  Filled 2017-07-26: qty 1

## 2017-07-26 MED ORDER — HYDROMORPHONE HCL 1 MG/ML IJ SOLN
0.5000 mg | Freq: Once | INTRAMUSCULAR | Status: AC
  Administered 2017-07-26: 0.5 mg via INTRAVENOUS

## 2017-07-26 MED ORDER — SODIUM CHLORIDE 0.9 % IV BOLUS (SEPSIS)
1000.0000 mL | Freq: Once | INTRAVENOUS | Status: AC
  Administered 2017-07-26: 1000 mL via INTRAVENOUS

## 2017-07-26 NOTE — ED Provider Notes (Signed)
[facility] EMERGENCY DEPARTMENT Provider Note   CSN: [id number] Arrival date & time: 07/26/17  1001     History   Chief Complaint Chief Complaint  Patient presents with  . Abdominal Pain    HPI [name] [name] is a 31 y.o. male.  HPI  [name] [name] is a 31 y.o. male, recently discharged from the hospital, who presents to the Emergency Department complaining of persistent upper abdominal pain.  He was seen in this emergency department on 07/17/2017 and subsequently admitted for colitis with pneumatosis and mucosal perforation.  After hospital discharge, patient followed up with GI and was scheduled for repeat  outpatient CT of his abdomen and pelvis.  Patient states that he woke up prior to arrival with worsening abdominal pain and persistent diarrhea, he contacted his GI providers office and was advised to come to the ER for further evaluation.  He states that his symptoms are not affected by food intake.  Diarrhea is intermittent described as watery and brown in color.  He denies fever, chills, vomiting and bloody stools.  Patient was covered with antibiotics during hospital admission and discharged home on Cipro and Flagyl.  He states he was prescribed Percocet upon hospital discharge, which he states is not helping to control his pain.  Past Medical History:  Diagnosis Date  . History of Legg-Calve-Perthes disease   . Small bowel obstruction [facility])     Patient Active Problem List   Diagnosis Date Noted  . Dehydration 07/20/2017  . Enteritis 07/20/2017  . Hypokalemia 07/20/2017  . Tobacco abuse 07/20/2017  . Diarrhea of presumed infectious origin   . Intussusception ([place])   . Colitis 07/17/2017  . Perforation of intestine ([place]) 07/17/2017  . Abnormal CT scan, small bowel   . Epigastric abdominal pain   . Ileus ([place]) 02/09/2015  . Hypotension 02/06/2015  . Nausea vomiting and diarrhea 02/06/2015  . Partial small bowel obstruction ([place]) 10/27/2013  . Abdominal pain 10/27/2013     Past Surgical History:  Procedure Laterality Date  . ABDOMINAL SURGERY    . CHOLECYSTECTOMY    . HERNIA REPAIR    . HIP ARTHROSCOPY  2018   right hip       Home Medications    Prior to Admission medications   Medication Sig Start Date End Date Taking? Authorizing Provider  omeprazole (PRILOSEC) 40 MG capsule Take 1 capsule (40 mg total) by mouth daily. 07/20/17  Yes Dhungel, Nishant, MD  ondansetron (ZOFRAN) 4 MG tablet Take 1 tablet (4 mg total) by mouth every 8 (eight) hours as needed for nausea or vomiting. 07/20/17 07/20/18 Yes Dhungel, Nishant, MD  oxyCODONE-acetaminophen (PERCOCET) 10-325 MG tablet Take 1 tablet by mouth every 6 (six) hours as needed for pain. 07/20/17 07/20/18 Yes Dhungel, Nishant, MD  tetrahydrozoline 0.05 % ophthalmic solution Place into both eyes daily as needed (dryness).   Yes [provider]  nicotine (NICODERM CQ - DOSED IN MG/24 HOURS) 14 mg/24hr patch Place 1 patch (14 mg total) onto the skin daily. Patient not taking: Reported on 07/26/2017 07/20/17   Louellen Molder, MD    Family History Family History  Problem Relation Age of Onset  . Colon cancer Unknown        grandmother  . Colon cancer Paternal Grandmother     Social History Social History   Tobacco Use  . Smoking status: Current Every Day Smoker    Packs/day: 0.00    Years: 7.00    Pack years: 0.00  Types: Cigarettes  . Smokeless tobacco: Never Used  Substance Use Topics  . Alcohol use: Yes    Alcohol/week: 0.0 oz    Comment: once or twice per week  . Drug use: No     Allergies   Patient has no known allergies.   Review of Systems Review of Systems  Constitutional: Negative for appetite change, chills and fever.  Respiratory: Negative for shortness of breath.   Cardiovascular: Negative for chest pain.  Gastrointestinal: Positive for abdominal pain. Negative for abdominal distention, anal bleeding, blood in stool, diarrhea, nausea and vomiting.   Genitourinary: Negative for decreased urine volume, difficulty urinating, dysuria and flank pain.  Musculoskeletal: Negative for back pain.  Skin: Negative for color change and rash.  Neurological: Negative for dizziness, weakness and numbness.  Hematological: Negative for adenopathy.  All other systems reviewed and are negative.    Physical Exam Updated Vital Signs BP 114/76 (BP Location: Right Arm)   Pulse 60   Temp 98 F (36.7 C) (Oral)   Resp 16   Ht 5\' 10"  (1.778 m)   Wt 90.7 kg (200 lb)   SpO2 96%   BMI 28.70 kg/m   Physical Exam  Constitutional: He is oriented to person, place, and time. He appears well-developed and well-nourished. No distress.  HENT:  Head: Normocephalic and atraumatic.  Mouth/Throat: Oropharynx is clear and moist.  Cardiovascular: Normal rate, regular rhythm and intact distal pulses.  No murmur heard. Pulmonary/Chest: Effort normal and breath sounds normal. No respiratory distress.  Abdominal: Soft. Bowel sounds are normal. He exhibits no distension, no ascites and no mass. There is tenderness in the epigastric area. There is no rebound, no guarding, no CVA tenderness and no tenderness at McBurney's point.  Musculoskeletal: Normal range of motion. He exhibits no edema.  Neurological: He is alert and oriented to person, place, and time. He exhibits normal muscle tone. Coordination normal.  Skin: Skin is warm and dry.  Psychiatric: He has a normal mood and affect.  Nursing note and vitals reviewed.    ED Treatments / Results  Labs (all labs ordered are listed, but only abnormal results are displayed) Labs Reviewed  LIPASE, BLOOD  COMPREHENSIVE METABOLIC PANEL  CBC  URINALYSIS, ROUTINE W REFLEX MICROSCOPIC    EKG  EKG Interpretation None       Radiology No results found.  Procedures Procedures (including critical care time)  Medications Ordered in ED Medications  HYDROmorphone (DILAUDID) injection 1 mg (1 mg Intravenous Given  07/26/17 1127)  ondansetron (ZOFRAN) injection 4 mg (4 mg Intravenous Given 07/26/17 1127)  sodium chloride 0.9 % bolus 1,000 mL (1,000 mLs Intravenous New Bag/Given 07/26/17 1127)     Initial Impression / Assessment and Plan / ED Course  I have reviewed the triage vital signs and the nursing notes.  Pertinent labs & imaging results that were available during my care of the patient were reviewed by me and considered in my medical decision making (see chart for details).      Consulted Dr. Arnoldo Morale, who recommended GI consult.    [place] Dr. Laural Golden, who did not feel that additional CT of the abdomen was needed.  Pt scheduled for EGD next week.  Patient appears stable for discharge home, no concerning symptoms for acute abdomen. He states pain is not well controlled with Percocet that he was given upon hospital discharge.  Will prescribe short course of oral Dilaudid until patient's GI follow-up  Final Clinical Impressions(s) / ED Diagnoses  Final diagnoses:  Epigastric pain    ED Discharge Orders    None       Kem Parkinson, PA-C 07/28/17 2020    Nat Christen, MD 07/29/17 [phone]

## 2017-07-26 NOTE — ED Notes (Signed)
Pt states he does not need to urinate at this time, aware of DO  

## 2017-07-26 NOTE — ED Triage Notes (Signed)
Pt reports was admitted last week for intestinal blockage.  Pt says was discharged after 5 days.  Pt says he saw GI yesterday and was scheduled for a CT scan Dec 27th.  Pt says wife called Dr. Laural Golden this morning and told him to come to ER for eval instead of waiting until the 27th.  Pt c/o upper abd pain and diarrhea.  No vomiting.

## 2017-07-26 NOTE — Discharge Instructions (Signed)
Stop the percocet while taking the dilaudid.  Continue your other medications as directed.  Follow-up with Dr. Laural Golden

## 2017-07-26 NOTE — ED Notes (Signed)
PA aware of hr and bp.  0.5mg  of dilaudid administered.

## 2017-07-26 NOTE — ED Notes (Signed)
Pt c.o cp in center of  Chest.  EKG done and given to Dr Lacinda Axon.  Notified Tammy Triplett pa

## 2017-07-28 LAB — TISSUE TRANSGLUTAMINASE, IGA

## 2017-07-28 LAB — IGG 4: IGG 4: 44 mg/dL (ref 2–96)

## 2017-07-29 LAB — GLIADIN ANTIBODIES, SERUM
ANTIGLIADIN ABS, IGA: 4 U (ref 0–19)
GLIADIN IGG: 2 U (ref 0–19)

## 2017-07-30 ENCOUNTER — Encounter: Payer: Self-pay | Admitting: General Surgery

## 2017-07-30 ENCOUNTER — Encounter (HOSPITAL_COMMUNITY)
Admission: RE | Admit: 2017-07-30 | Discharge: 2017-07-30 | Disposition: A | Discharge status: Home / Self Care | Payer: 59 | Source: Ambulatory Visit | Attending: Internal Medicine | Admitting: Internal Medicine

## 2017-07-30 ENCOUNTER — Ambulatory Visit (INDEPENDENT_AMBULATORY_CARE_PROVIDER_SITE_OTHER): Payer: 59 | Admitting: General Surgery

## 2017-07-30 ENCOUNTER — Encounter (HOSPITAL_COMMUNITY): Payer: Self-pay

## 2017-07-30 VITALS — BP 107/65 | HR 79 | Temp 98.0°F | Resp 18 | Ht 70.0 in | Wt 199.0 lb

## 2017-07-30 DIAGNOSIS — R1013 Epigastric pain: Secondary | ICD-10-CM | POA: Diagnosis not present

## 2017-07-30 DIAGNOSIS — K56600 Partial intestinal obstruction, unspecified as to cause: Secondary | ICD-10-CM

## 2017-07-30 DIAGNOSIS — K529 Noninfective gastroenteritis and colitis, unspecified: Secondary | ICD-10-CM | POA: Diagnosis not present

## 2017-07-30 MED ORDER — OXYCODONE-ACETAMINOPHEN 10-325 MG PO TABS: 1.0000 | ORAL_TABLET | ORAL | 0 refills | Status: DC | PRN

## 2017-07-30 NOTE — Patient Instructions (Signed)
EGD on Friday with Dr. Laural Golden.  Stop the Dilaudid and take percocet.

## 2017-07-30 NOTE — Patient Instructions (Signed)
20    Your procedure is scheduled on: 08/02/2017  Report to [facility] at    7:00 AM.  Call this number if you have problems the morning of surgery: [phone]   Remember:   Do not drink or eat food:After Midnight.    Clear liquids include soda, tea, black coffee, apple or grape juice, broth.  Take these medicines the morning of surgery with A SIP OF WATER: Prilosec and Zofran   Do not wear jewelry, make-up or nail polish.  Do not wear lotions, powders, or perfumes. You may wear deodorant.  Do not shave 48 hours prior to surgery. Men may shave face and neck.  Do not bring valuables to the hospital.  Contacts, dentures or bridgework may not be worn into surgery.  Leave suitcase in the car. After surgery it may be brought to your room.  For patients admitted to the hospital, checkout time is 11:00 AM the day of discharge.   Patients discharged the day of surgery will not be allowed to drive home.  Name and phone number of your driver:    Please read over the following fact sheets that you were given: Pain Booklet, Lab Information and Anesthesia Post-op Instructions   Endoscopy Care After Please read the instructions outlined below and refer to this sheet in the next few weeks. These discharge instructions provide you with general information on caring for yourself after you leave the hospital. Your doctor may also give you specific instructions. While your treatment has been planned according to the most current medical practices available, unavoidable complications occasionally occur. If you have any problems or questions after discharge, please call your doctor. HOME CARE INSTRUCTIONS Activity  You may resume your regular activity but move at a slower pace for the next 24 hours.   Take frequent rest periods for the next 24 hours.   Walking will help expel (get rid of) the air and reduce the bloated feeling in your abdomen.   No driving for 24 hours (because of the anesthesia  (medicine) used during the test).   You may shower.   Do not sign any important legal documents or operate any machinery for 24 hours (because of the anesthesia used during the test).  Nutrition  Drink plenty of fluids.   You may resume your normal diet.   Begin with a light meal and progress to your normal diet.   Avoid alcoholic beverages for 24 hours or as instructed by your caregiver.  Medications You may resume your normal medications unless your caregiver tells you otherwise. What you can expect today  You may experience abdominal discomfort such as a feeling of fullness or "gas" pains.   You may experience a sore throat for 2 to 3 days. This is normal. Gargling with salt water may help this.  Follow-up Your doctor will discuss the results of your test with you. SEEK IMMEDIATE MEDICAL CARE IF:  You have excessive nausea (feeling sick to your stomach) and/or vomiting.   You have severe abdominal pain and distention (swelling).   You have trouble swallowing.   You have a temperature over 100 F (37.8 C).   You have rectal bleeding or vomiting of blood.  Document Released: 03/13/2004 Document Revised: 07/19/2011 Document Reviewed: 09/24/2007

## 2017-07-30 NOTE — Progress Notes (Signed)
[facility] History and Physical   Chief Complaint    Hospitalization Follow-up      [name] [name] is a 31 y.o. male.  HPI: [name] [name] is a 61 you with chronic abdominal pain who reports having a pain and discomfort in his epigastric region for several years. This results if episodes of severe abdominal pain, nausea, vomiting and diarrhea and he presents to the hospital with findings of enteritis versus a pSBO.  He has had 2 prior surgeries including a umbilical hernia repair with mesh done emergently it sounds like based on his description, and a laparoscopic cholecystectomy.  He continues to have these episodes of severe abdominal pain of unknown origin approximately yearly to every 18 months, and requires hospitalization. He has never had an EGD.  He has reported some history of some melena and hematemesis in the past, but nothing that is frequent. During his most recent hospitalization, he underwent a CT that demonstrated duodenitis and jejunitis with an area of pneumatosis. He had normal labs and reassuring vitals and exam, and was monitored with IV antibiotics and made NPO.   Since his discharge, he has continued to have pain, which he reports is unusual for the episodes as it will normally subside after some period of time. He says that the pain is chronic, stabbing, and with every breath he feels like is being stabbed. The pain is also moving more into his left side and down into the abdomen on the left from his epigastric area and up into his chest. He went to the ED last week due to this pain and labs were done which are all normal to date including labs to check for Celiac. The only pending lab is Reticulin Antibody, IgA.  He was given a prescription for dilaudid, which he says is too strong and making him very sleeping, and he has stopped taking it.   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   Past Medical History:  Diagnosis Date  . History of Legg-Calve-Perthes disease   . Small bowel obstruction [facility])     Past Surgical History:  Procedure Laterality Date  . ABDOMINAL SURGERY    . CHOLECYSTECTOMY    . HERNIA REPAIR    . HIP ARTHROSCOPY  2018   right hip    Family History  Problem Relation Age of Onset  . Colon cancer Unknown        grandmother  . Colon cancer Paternal Grandmother     Social History   Tobacco Use  . Smoking status: Current Every Day Smoker    Packs/day: 0.00    Years: 7.00    Pack years: 0.00    Types: Cigarettes  . Smokeless tobacco: Never Used  Substance Use Topics  . Alcohol use: Yes    Alcohol/week: 0.0 oz    Comment: once or twice per week  . Drug use: No    Medications: I have reviewed the patient's current medications. Allergies as of 07/30/2017   No Known Allergies     Medication List        Accurate as of 07/30/17 11:43 AM. Always use your most recent med list.          nicotine 14 mg/24hr patch Commonly known as:  NICODERM CQ - dosed in  mg/24 hours Place 1 patch (14 mg total) onto the skin daily.   omeprazole 40 MG capsule Commonly known as:  PRILOSEC Take 1 capsule (40 mg total) by mouth daily.   ondansetron 4 MG tablet Commonly known as:  ZOFRAN Take 1 tablet (4 mg total) by mouth every 8 (eight) hours as needed for nausea or vomiting.   oxyCODONE-acetaminophen 10-325 MG tablet Commonly known as:  PERCOCET Take 1 tablet by mouth every 4 (four) hours as needed for pain.   tetrahydrozoline 0.05 % ophthalmic solution Place into both eyes daily as needed (dryness).        ROS:  A comprehensive review of systems was negative except for: Gastrointestinal: positive for abdominal pain and decreased appetite  + Having BMs Staying hydrated but little intake of food   Blood pressure 107/65, pulse 79, temperature  98 F (36.7 C), resp. rate 18, height 5\' 10"  (1.778 m), weight 199 lb (90.3 kg). Physical Exam  Constitutional: He is oriented to person, place, and time and well-developed, well-nourished, and in no distress.  Eyes: Pupils are equal, round, and reactive to light.  Neck: Normal range of motion.  Cardiovascular: Normal rate and regular rhythm.  Pulmonary/Chest: Effort normal.  Abdominal: Soft. He exhibits no distension. There is tenderness in the epigastric area and left upper quadrant. There is no rebound and no guarding.  Musculoskeletal: Normal range of motion.  Neurological: He is alert and oriented to person, place, and time.  Skin: Skin is warm and dry.  Psychiatric: Mood, memory, affect and judgment normal.  Vitals reviewed.   Results: CBC    Component Value Date/Time   WBC 5.6 07/26/2017 1020   RBC 4.86 07/26/2017 1020   HGB 14.3 07/26/2017 1020   HCT 43.7 07/26/2017 1020   PLT 341 07/26/2017 1020   MCV 89.9 07/26/2017 1020   MCH 29.4 07/26/2017 1020   MCHC 32.7 07/26/2017 1020   RDW 13.4 07/26/2017 1020   LYMPHSABS 1,518 07/25/2017 1546   MONOABS 0.5 07/20/2017 0556   EOSABS 162 07/25/2017 1546   BASOSABS 28 07/25/2017 1546   CMP     Component Value Date/Time   NA 138 07/26/2017 1020   K 3.8 07/26/2017 1020   CL 104 07/26/2017 1020   CO2 29 07/26/2017 1020   GLUCOSE 97 07/26/2017 1020   BUN 14 07/26/2017 1020   CREATININE 0.96 07/26/2017 1020   CALCIUM 9.0 07/26/2017 1020   PROT 7.2 07/26/2017 1020   ALBUMIN 4.4 07/26/2017 1020   AST 21 07/26/2017 1020   ALT 29 07/26/2017 1020   ALKPHOS 44 07/26/2017 1020   BILITOT 0.7 07/26/2017 1020   GFRNONAA >60 07/26/2017 1020   GFRAA >60 07/26/2017 1020   Antigliadin Abs IgA - 4 (w/in reference range)  Sed Rate 2 (w/in reference range)  Deamidated Gliadin Ab IgG  2 (w/in reference range)  Tissue Transglutaminase Ab IgA <2 (w/in reference range)  IgG Subclass 4- 44 (w/in reference range)  Reticulan AB IgA  pending   CT 12/5 - duodenitis, jejunitis with jejunal pneumatosis, no free air or free fluid, pSBO from thickened jejunum CT 12/7 - improved duodenal/ jejunal thickening, no pneumatosis, small point of intussusception in jejunum   Assessment & Plan:  [name] [name] is a 31 y.o. male with chronic abdominal pain with signs of enteritis causing SBO on admission to the hospital multiple times in the past. The pain is chronic and has episodic flares that are worse in nature. He has never had an  EGD and is scheduled for one 12/21 with Dr. Laural Golden.  He had an area of pneumatosis on the admission Ct but this resolved with bowel rest and antibiotics but he continues to have pain. We do not know the etiology of this pain and could include some inflammatory issue such as atypical Crohn's, Celiac was on the differential but less likely with the serology testing but not 100%. He did have a small point of intussusception which was likely transient and related to the inflammation potentially of the bowel, but could indicate a lead point if no other etiology is found.  -Have discussed the patient with Dr. Laural Golden  -Plan for a stepwise approach to determining etiology including EGD, capsule study if Dr. Laural Golden thinks would be beneficial followed by CT enterography and lastly a diagnostic laparoscopy to assess for any other causes of the pain -Patient unable to take the dilaudid due to the fact it makes him so sleepy and drowsy. He has been on percocet in the past, and although this is not a strong and does not reduce the pain as much as the dilaudid, it is preferable to him due to the sedating effects of the dilaudid -Prescribed percocet 10mg -325mg  q4 PRN (#24 tablets for the next 4 days prior to the endoscopy) -Reviewed the [place] substance abuse database, and the patient had a Rx from the Hospital admission of 24 percocets 12/8 and the dilaudid prescription is not listed as of yet, but the patient did have this filled per his  report but is not taking them due to drowsiness  -His previous Rx for narcotics were prior to this with the most recent being 02/2017 and related to his congenital hip pain/ surgery  -Follow up to be determined after EGD and needed additional studies   All questions were answered to the satisfaction of the patient.  [name] [name] 07/30/2017, 11:43 AM

## 2017-07-31 LAB — RETICULIN ANTIBODIES, IGA W TITER: RETICULIN AB, IGA: NEGATIVE {titer}

## 2017-08-02 ENCOUNTER — Encounter (HOSPITAL_COMMUNITY): Payer: Self-pay | Admitting: *Deleted

## 2017-08-02 ENCOUNTER — Encounter (HOSPITAL_COMMUNITY)
Admission: RE | Disposition: A | Discharge status: Home / Self Care | Payer: Self-pay | Source: Ambulatory Visit | Attending: Internal Medicine

## 2017-08-02 ENCOUNTER — Ambulatory Visit (HOSPITAL_COMMUNITY): Payer: 59 | Admitting: Anesthesiology

## 2017-08-02 ENCOUNTER — Ambulatory Visit (HOSPITAL_COMMUNITY)
Admission: RE | Admit: 2017-08-02 | Discharge: 2017-08-02 | Disposition: A | Discharge status: Home / Self Care | Payer: 59 | Source: Ambulatory Visit | Attending: Internal Medicine | Admitting: Internal Medicine

## 2017-08-02 DIAGNOSIS — K529 Noninfective gastroenteritis and colitis, unspecified: Secondary | ICD-10-CM | POA: Diagnosis not present

## 2017-08-02 DIAGNOSIS — K449 Diaphragmatic hernia without obstruction or gangrene: Secondary | ICD-10-CM | POA: Insufficient documentation

## 2017-08-02 DIAGNOSIS — K319 Disease of stomach and duodenum, unspecified: Secondary | ICD-10-CM | POA: Insufficient documentation

## 2017-08-02 DIAGNOSIS — R109 Unspecified abdominal pain: Secondary | ICD-10-CM

## 2017-08-02 DIAGNOSIS — K228 Other specified diseases of esophagus: Secondary | ICD-10-CM | POA: Diagnosis not present

## 2017-08-02 DIAGNOSIS — Z79899 Other long term (current) drug therapy: Secondary | ICD-10-CM | POA: Diagnosis not present

## 2017-08-02 DIAGNOSIS — F17210 Nicotine dependence, cigarettes, uncomplicated: Secondary | ICD-10-CM | POA: Diagnosis not present

## 2017-08-02 DIAGNOSIS — R933 Abnormal findings on diagnostic imaging of other parts of digestive tract: Secondary | ICD-10-CM | POA: Diagnosis not present

## 2017-08-02 DIAGNOSIS — K2950 Unspecified chronic gastritis without bleeding: Secondary | ICD-10-CM | POA: Diagnosis not present

## 2017-08-02 DIAGNOSIS — R1012 Left upper quadrant pain: Secondary | ICD-10-CM | POA: Diagnosis not present

## 2017-08-02 DIAGNOSIS — K2970 Gastritis, unspecified, without bleeding: Secondary | ICD-10-CM | POA: Insufficient documentation

## 2017-08-02 DIAGNOSIS — R1013 Epigastric pain: Secondary | ICD-10-CM | POA: Diagnosis not present

## 2017-08-02 HISTORY — PX: ESOPHAGOGASTRODUODENOSCOPY (EGD) WITH PROPOFOL: SHX5813

## 2017-08-02 HISTORY — PX: ENTEROSCOPY: SHX5533

## 2017-08-02 HISTORY — PX: BIOPSY: SHX5522

## 2017-08-02 SURGERY — ESOPHAGOGASTRODUODENOSCOPY (EGD) WITH PROPOFOL
Anesthesia: Monitor Anesthesia Care

## 2017-08-02 MED ORDER — OXYCODONE-ACETAMINOPHEN 10-325 MG PO TABS: 1.0000 | ORAL_TABLET | Freq: Four times a day (QID) | ORAL | 0 refills | Status: DC | PRN

## 2017-08-02 MED ORDER — CHLORHEXIDINE GLUCONATE CLOTH 2 % EX PADS: 6.0000 | MEDICATED_PAD | Freq: Once | CUTANEOUS | Status: DC

## 2017-08-02 MED ORDER — FENTANYL CITRATE (PF) 100 MCG/2ML IJ SOLN
INTRAMUSCULAR | Status: AC
  Filled 2017-08-02: qty 2

## 2017-08-02 MED ORDER — PROPOFOL 10 MG/ML IV BOLUS
INTRAVENOUS | Status: AC
  Filled 2017-08-02: qty 60

## 2017-08-02 MED ORDER — MIDAZOLAM HCL 2 MG/2ML IJ SOLN
1.0000 mg | INTRAMUSCULAR | Status: AC
  Administered 2017-08-02: 2 mg via INTRAVENOUS

## 2017-08-02 MED ORDER — FENTANYL CITRATE (PF) 100 MCG/2ML IJ SOLN
50.0000 ug | Freq: Once | INTRAMUSCULAR | Status: AC
  Administered 2017-08-02: 50 ug via INTRAVENOUS

## 2017-08-02 MED ORDER — LIDOCAINE VISCOUS 2 % MT SOLN
5.0000 mL | Freq: Once | OROMUCOSAL | Status: AC
  Administered 2017-08-02: 5 mL via OROMUCOSAL

## 2017-08-02 MED ORDER — LACTATED RINGERS IV SOLN
INTRAVENOUS | Status: DC
  Administered 2017-08-02: 08:00:00 via INTRAVENOUS

## 2017-08-02 MED ORDER — PROPOFOL 500 MG/50ML IV EMUL
INTRAVENOUS | Status: DC | PRN
  Administered 2017-08-02: 150 ug/kg/min via INTRAVENOUS

## 2017-08-02 MED ORDER — MIDAZOLAM HCL 2 MG/2ML IJ SOLN
INTRAMUSCULAR | Status: AC
  Filled 2017-08-02: qty 2

## 2017-08-02 MED ORDER — LIDOCAINE VISCOUS 2 % MT SOLN
OROMUCOSAL | Status: AC
  Filled 2017-08-02: qty 15

## 2017-08-02 MED ORDER — FENTANYL CITRATE (PF) 100 MCG/2ML IJ SOLN
25.0000 ug | Freq: Once | INTRAMUSCULAR | Status: AC
Start: 2017-08-02 — End: 2017-08-02
  Administered 2017-08-02: 25 ug via INTRAVENOUS

## 2017-08-02 MED ORDER — ONDANSETRON HCL 4 MG/2ML IJ SOLN
4.0000 mg | Freq: Once | INTRAMUSCULAR | Status: AC
  Administered 2017-08-02: 4 mg via INTRAVENOUS

## 2017-08-02 MED ORDER — ONDANSETRON HCL 4 MG/2ML IJ SOLN
INTRAMUSCULAR | Status: AC
  Filled 2017-08-02: qty 2

## 2017-08-02 MED ORDER — PROPOFOL 10 MG/ML IV BOLUS
INTRAVENOUS | Status: DC | PRN
  Administered 2017-08-02 (×2): 10 mg via INTRAVENOUS
  Administered 2017-08-02: 30 mg via INTRAVENOUS

## 2017-08-02 NOTE — Anesthesia Postprocedure Evaluation (Signed)
Anesthesia Post Note  Patient: Davone Shinault  Procedure(s) Performed: ESOPHAGOGASTRODUODENOSCOPY (EGD) WITH PROPOFOL (N/A ) BIOPSY (N/A ) PUSH ENTEROSCOPY (N/A )  Patient location during evaluation: PACU Anesthesia Type: MAC Level of consciousness: awake and alert and oriented Pain management: satisfactory to patient Vital Signs Assessment: post-procedure vital signs reviewed and stable Respiratory status: spontaneous breathing Cardiovascular status: stable Postop Assessment: no apparent nausea or vomiting Anesthetic complications: no     Last Vitals:  Vitals:   08/02/17 0840 08/02/17 0945  BP: (!) 97/49   Pulse:    Resp: 12   Temp:  (P) 36.7 C  SpO2: 99%     Last Pain:  Vitals:   08/02/17 0945  TempSrc:   PainSc: (P) 0-No pain                 Javeion Cannedy

## 2017-08-02 NOTE — Discharge Instructions (Signed)
Esophagogastroduodenoscopy, Care After Refer to this sheet in the next few weeks. These instructions provide you with information about caring for yourself after your procedure. Your health care provider may also give you more specific instructions. Your treatment has been planned according to current medical practices, but problems sometimes occur. Call your health care provider if you have any problems or questions after your procedure. What can I expect after the procedure? After the procedure, it is common to have:  A sore throat.  Nausea.  Bloating.  Dizziness.  Fatigue.  Follow these instructions at home:  Do not eat or drink anything until the numbing medicine (local anesthetic) has worn off and your gag reflex has returned. You will know that the local anesthetic has worn off when you can swallow comfortably.  Do not drive for 24 hours if you received a medicine to help you relax (sedative).  If your health care provider took a tissue sample for testing during the procedure, make sure to get your test results. This is your responsibility. Ask your health care provider or the department performing the test when your results will be ready.  Keep all follow-up visits as told by your health care provider. This is important. Contact a health care provider if:  You cannot stop coughing.  You are not urinating.  You are urinating less than usual. Get help right away if:  You have trouble swallowing.  You cannot eat or drink.  You have throat or chest pain that gets worse.  You are dizzy or light-headed.  You faint.  You have nausea or vomiting.  You have chills.  You have a fever.  You have severe abdominal pain.  You have black, tarry, or bloody stools. This information is not intended to replace advice given to you by your health care provider. Make sure you discuss any questions you have with your health care provider. Document Released: 07/16/2012 Document  Revised: 01/05/2016 Document Reviewed: 06/23/2015 Elsevier Interactive Patient Education  2018 [employer]. No aspirin or NSAIDs for 48 hours. Resume usual medications and diet. No driving for 24 hours. Physician will call with biopsy results next week.

## 2017-08-02 NOTE — Anesthesia Preprocedure Evaluation (Signed)
Anesthesia Evaluation  Patient identified by MRN, date of birth, ID band Patient awake    Reviewed: Allergy & Precautions, NPO status , Patient's Chart, lab work & pertinent test results  Airway Mallampati: II  TM Distance: >3 FB Neck ROM: Full    Dental  (+) Teeth Intact   Pulmonary Current Smoker,    breath sounds clear to auscultation       Cardiovascular negative cardio ROS   Rhythm:Regular Rate:Normal     Neuro/Psych    GI/Hepatic   Endo/Other  negative endocrine ROS  Renal/GU negative Renal ROS     Musculoskeletal   Abdominal   Peds  Hematology negative hematology ROS (+)   Anesthesia Other Findings Chronic abdominal pain.....  Reproductive/Obstetrics                             Anesthesia Physical Anesthesia Plan  ASA: II  Anesthesia Plan: MAC   Post-op Pain Management:    Induction: Intravenous  PONV Risk Score and Plan:   Airway Management Planned: Simple Face Mask  Additional Equipment:   Intra-op Plan:   Post-operative Plan:   Informed Consent: I have reviewed the patients History and Physical, chart, labs and discussed the procedure including the risks, benefits and alternatives for the proposed anesthesia with the patient or authorized representative who has indicated his/her understanding and acceptance.     Plan Discussed with:   Anesthesia Plan Comments:         Anesthesia Quick Evaluation

## 2017-08-02 NOTE — Op Note (Addendum)
[facility] Patient Name: [name] [name] Procedure Date: 08/02/2017 8:31 AM MRN: [id number] Date of Birth: April 19, 1986 Attending MD: Hildred Laser , MD CSN: [id number] Age: 31 Admit Type: Outpatient Procedure:                Small bowel enteroscopy Indications:              Unexplained epigastric abdominal distress/pain,                            Unexplained abdominal distress/pain in the left                            upper quadrant, Abnormal abdominal CT Providers:                Hildred Laser, MD, Otis Peak B. Sharon Seller, RN, Nelma Rothman, Technician, Randa Spike, Technician Referring MD:             Redmond School, MD and Blake Divine, MD Medicines:                Lidocaine spray, Propofol per Anesthesia Complications:            No immediate complications. Estimated Blood Loss:     Estimated blood loss was minimal. Procedure:                Pre-Anesthesia Assessment:                           - Prior to the procedure, a History and Physical                            was performed, and patient medications and                            allergies were reviewed. The patient's tolerance of                            previous anesthesia was also reviewed. The risks                            and benefits of the procedure and the sedation                            options and risks were discussed with the patient.                            All questions were answered, and informed consent                            was obtained. Prior Anticoagulants: The patient has                            taken no previous anticoagulant or antiplatelet  agents. ASA Grade Assessment: II - A patient with                            mild systemic disease. After reviewing the risks                            and benefits, the patient was deemed in                            satisfactory condition to undergo the procedure.                            After obtaining informed consent, the endoscope was                            passed under direct vision. Throughout the                            procedure, the patient's blood pressure, pulse, and                            oxygen saturations were monitored continuously. The                            EC-[id number] ([id number]) scope was introduced through                            the mouth and advanced to the proximal jejunum. The                            small bowel enteroscopy was accomplished without                            difficulty. The patient tolerated the procedure                            well. Scope In: 9:16:27 AM Scope Out: 9:36:36 AM Total Procedure Duration: 0 hours 20 minutes 9 seconds  Findings:      The examined esophagus was normal.      The Z-line was irregular and was found 39 cm from the incisors.      A 2 cm hiatal hernia was present.      The cardia, gastric fundus, gastric body and pylorus were normal.      Patchy mild inflammation characterized by erythema and granularity was       found in the gastric antrum. Biopsies were taken with a cold forceps for       histology. The pathology specimen was placed into Bottle Number 3.      There was no evidence of significant pathology in the duodenal bulb, in       the second portion of the duodenum, in the third portion of the duodenum       and in the fourth portion of the duodenum. Biopsies were taken with a       cold forceps for histology. The pathology specimen was placed into  Bottle Number 2.      Normal mucosa was found in the jejunum. Biopsies were taken with a cold       forceps for histology. The pathology specimen was placed into Bottle       Number 1. Impression:               - Normal esophagus.                           - Z-line irregular, 39 cm from the incisors.                           - 2 cm hiatal hernia.                           - Normal cardia, gastric fundus, gastric body and                             pylorus.                           - Gastritis. Biopsied. biopsy also taken from                            gastric body.                           - Normal duodenal bulb, second portion of the                            duodenum, third portion of the duodenum and fourth                            portion of the duodenum. Biopsied.                           - thickened folds but normal mucosa was found in                            the jejunum. Biopsied.                           Comment: No ulceration or stricture noted in                            segments that were examined. Moderate Sedation:      Per Anesthesia Care Recommendation:           - Discharge patient to home (with parent).                           - Resume previous diet today.                           - Continue present medications.                           - Await  pathology results. Procedure Code(s):        --- Professional ---                           [phone], Small intestinal endoscopy, enteroscopy                            beyond second portion of duodenum, not including                            ileum; with biopsy, single or multiple Diagnosis Code(s):        --- Professional ---                           K22.8, Other specified diseases of esophagus                           K44.9, Diaphragmatic hernia without obstruction or                            gangrene                           K29.70, Gastritis, unspecified, without bleeding                           R10.13, Epigastric pain                           R10.12, Left upper quadrant pain                           R93.3, Abnormal findings on diagnostic imaging of                            other parts of digestive tract CPT copyright 2016 American [employer] Association. All rights reserved. The codes documented in this report are preliminary and upon coder review may  be revised to meet current compliance requirements. Hildred Laser,  MD Hildred Laser, MD 08/02/2017 9:49:52 AM This report has been signed electronically. Number of Addenda: 0

## 2017-08-02 NOTE — Transfer of Care (Signed)
Immediate Anesthesia Transfer of Care Note  Patient: [name] [name]  Procedure(s) Performed: ESOPHAGOGASTRODUODENOSCOPY (EGD) WITH PROPOFOL (N/A ) BIOPSY (N/A ) PUSH ENTEROSCOPY (N/A )  Patient Location: PACU  Anesthesia Type:MAC  Level of Consciousness: awake, alert  and oriented  Airway & Oxygen Therapy: Patient Spontanous Breathing  Post-op Assessment: Report given to RN  Post vital signs: Reviewed and stable  Last Vitals:  Vitals:   08/02/17 0840 08/02/17 0945  BP: (!) 97/49   Pulse:    Resp: 12   Temp:  (P) 36.7 C  SpO2: 99%     Last Pain:  Vitals:   08/02/17 0945  TempSrc:   PainSc: (P) 0-No pain      Patients Stated Pain Goal: 8 (28/11/88 6773)  Complications: No apparent anesthesia complications

## 2017-08-02 NOTE — H&P (Signed)
[name] [name] is an 31 y.o. male.   Chief Complaint: Patient is here for EGD and enteroscopy. HPI: Patient is a 31 year old Caucasian male who has been having spells of abdominal pain with nausea vomiting and diarrhea for over 3 years.  He has been hospitalized few times more recently about 2 weeks ago.  Recent imaging studies revealed thickening to proximal jejunal mucosa as well as duodenal mucosa.  He was recently hospitalized in [place] on 07/17/2017 revealed dilation to proximal jejunum and duodenum along with wall thickening as well as focal area with pneumatosis.  He was empirically treated with antibiotics.  CT 2 days later did not show this these changes.  Instead it showed small intussusception felt to be an insignificant finding.  IgG4 is been normal.  Celiac antibody panel similarly has been negative. Patient now has daily pain.  Pain is primarily in epigastric region left upper quadrant left mid abdomen.  He has 6-8 loose stools every day.  However he is not losing weight.  He denies melena or rectal bleeding.  He feels he was on pain medication for a year and a half after his hip surgery and that may have masses GI symptoms. He smokes cigarettes but does not drink alcohol.  He does not take NSAIDs.  Past Medical History:  Diagnosis Date  . Enteritis   . History of Legg-Calve-Perthes disease   . Small bowel obstruction [facility])     Past Surgical History:  Procedure Laterality Date  . ABDOMINAL SURGERY    . CHOLECYSTECTOMY    . HERNIA REPAIR    . HIP ARTHROSCOPY  2018   right hip    Family History  Problem Relation Age of Onset  . Colon cancer Unknown        grandmother  . Colon cancer Paternal Grandmother    Social History:  reports that he has been smoking cigarettes.  He has a 7.00 pack-year smoking history. he has never used smokeless tobacco. He reports that he drinks alcohol. He reports that he does not use drugs.  Allergies: No Known Allergies  Medications Prior to Admission   Medication Sig Dispense Refill  . ciprofloxacin (CIPRO) 500 MG tablet Take 500 mg by mouth 2 (two) times daily.    . clarithromycin (BIAXIN) 250 MG tablet Take 250 mg by mouth 2 (two) times daily.    Marland Kitchen omeprazole (PRILOSEC) 40 MG capsule Take 1 capsule (40 mg total) by mouth daily. 30 capsule 0  . ondansetron (ZOFRAN) 4 MG tablet Take 1 tablet (4 mg total) by mouth every 8 (eight) hours as needed for nausea or vomiting. 12 tablet 0  . oxyCODONE-acetaminophen (PERCOCET) 10-325 MG tablet Take 1 tablet by mouth every 4 (four) hours as needed for pain. 24 tablet 0  . tetrahydrozoline 0.05 % ophthalmic solution Place into both eyes daily as needed (dryness).      No results found for this or any previous visit (from the past 48 hour(s)). No results found.  ROS  Blood pressure (!) 97/49, pulse 62, temperature 98 F (36.7 C), temperature source Oral, resp. rate 12, height 5\' 10"  (1.778 m), weight 199 lb (90.3 kg), SpO2 99 %. Physical Exam  Constitutional: He appears well-developed and well-nourished.  HENT:  Mouth/Throat: Oropharynx is clear and moist.  Eyes: Conjunctivae are normal. No scleral icterus.  Neck: No thyromegaly present.  Cardiovascular: Normal rate, regular rhythm and normal heart sounds.  No murmur heard. Respiratory: Effort normal and breath sounds normal.  GI:  Abdomen  is symmetrical.  He has large tattoo in left upper quadrant/left chest area.  Abdomen is soft with mild tenderness in mid epigastric region left upper quadrant and left mid abdomen.  No guarding.  No organomegaly or masses.  Musculoskeletal: He exhibits no edema.  Lymphadenopathy:    He has no cervical adenopathy.  Neurological: He is alert.  Skin: Skin is warm.     Assessment/Plan  Recurrent abdominal pain with nausea vomiting and diarrhea. Abnormal imaging revealing wall thickening to jejunum with dilation as well as duodenal dilation. Diagnostic push enteroscopy.  Hildred Laser, MD 08/02/2017,  8:57 AM

## 2017-08-07 ENCOUNTER — Other Ambulatory Visit (INDEPENDENT_AMBULATORY_CARE_PROVIDER_SITE_OTHER): Payer: Self-pay | Admitting: Internal Medicine

## 2017-08-07 DIAGNOSIS — R1013 Epigastric pain: Secondary | ICD-10-CM

## 2017-08-07 DIAGNOSIS — R935 Abnormal findings on diagnostic imaging of other abdominal regions, including retroperitoneum: Secondary | ICD-10-CM

## 2017-08-07 DIAGNOSIS — R1012 Left upper quadrant pain: Secondary | ICD-10-CM

## 2017-08-08 ENCOUNTER — Ambulatory Visit (HOSPITAL_COMMUNITY): Payer: 59

## 2017-08-08 ENCOUNTER — Ambulatory Visit (HOSPITAL_COMMUNITY)
Admission: RE | Admit: 2017-08-08 | Discharge: 2017-08-08 | Disposition: A | Discharge status: Home / Self Care | Payer: 59 | Source: Ambulatory Visit | Attending: Internal Medicine | Admitting: Internal Medicine

## 2017-08-08 ENCOUNTER — Encounter (HOSPITAL_COMMUNITY): Payer: Self-pay | Admitting: Internal Medicine

## 2017-08-08 DIAGNOSIS — R1013 Epigastric pain: Secondary | ICD-10-CM | POA: Insufficient documentation

## 2017-08-08 DIAGNOSIS — R197 Diarrhea, unspecified: Secondary | ICD-10-CM | POA: Diagnosis not present

## 2017-08-08 DIAGNOSIS — R935 Abnormal findings on diagnostic imaging of other abdominal regions, including retroperitoneum: Secondary | ICD-10-CM | POA: Insufficient documentation

## 2017-08-08 DIAGNOSIS — R1012 Left upper quadrant pain: Secondary | ICD-10-CM | POA: Diagnosis not present

## 2017-08-08 DIAGNOSIS — R1110 Vomiting, unspecified: Secondary | ICD-10-CM | POA: Diagnosis not present

## 2017-08-08 MED ORDER — IOPAMIDOL (ISOVUE-300) INJECTION 61%
INTRAVENOUS | Status: AC
  Filled 2017-08-08: qty 450

## 2017-08-08 MED ORDER — IOPAMIDOL (ISOVUE-300) INJECTION 61%
125.0000 mL | Freq: Once | INTRAVENOUS | Status: AC | PRN
  Administered 2017-08-08: 125 mL via INTRAVENOUS

## 2017-08-09 ENCOUNTER — Telehealth: Payer: Self-pay | Admitting: General Surgery

## 2017-08-09 MED ORDER — OXYCODONE-ACETAMINOPHEN 10-325 MG PO TABS: 1.0000 | ORAL_TABLET | Freq: Four times a day (QID) | ORAL | 0 refills | Status: DC | PRN

## 2017-08-09 NOTE — Telephone Encounter (Addendum)
Discussed findings of CT enterography. No obvious source of the pain. Has had an extensive workup for this abdominal pain. Reports that it is not much better and he is having to take the percocet daily for any relief.  We have discussed this thoroughly and I am not sure what is causing this problem or if he has some scar tissue or even mesh that could be causing the pain.  We have discussed a diagnostic laparoscopy to determine if there is anything surgical that can be corrected. He would like to pursue this, and given that it needs to be prequalified for insurance, we will plan for 08/26/17.  Plan for diagnostic laparoscopy, possible lysis of adhesions, possible bowel resection, possible mesh removal.   He is running out of pain medications as of this weekend.  He continues to be in pain and prescribes are only providing the week supply as per the [place] regulations. I will refill the prescription prior to surgery.    Wife will pick up the prescription. Percocet 10-325 Quantity 24 tablets given. TO be filled on 08/10/17.  Unable to print the prescription, will hand write.    Curlene Labrum, MD [facility] [address] [place], [place] [address] [phone] (office)

## 2017-08-12 ENCOUNTER — Other Ambulatory Visit (HOSPITAL_COMMUNITY): Payer: Self-pay | Admitting: Internal Medicine

## 2017-08-12 ENCOUNTER — Ambulatory Visit (HOSPITAL_COMMUNITY)
Admission: RE | Admit: 2017-08-12 | Discharge: 2017-08-12 | Disposition: A | Discharge status: Home / Self Care | Payer: 59 | Source: Ambulatory Visit | Attending: Internal Medicine | Admitting: Internal Medicine

## 2017-08-12 DIAGNOSIS — R079 Chest pain, unspecified: Secondary | ICD-10-CM

## 2017-08-12 DIAGNOSIS — R05 Cough: Secondary | ICD-10-CM | POA: Diagnosis not present

## 2017-08-14 ENCOUNTER — Encounter: Payer: Self-pay | Admitting: General Surgery

## 2017-08-14 DIAGNOSIS — K565 Intestinal adhesions [bands], unspecified as to partial versus complete obstruction: Secondary | ICD-10-CM | POA: Insufficient documentation

## 2017-08-15 ENCOUNTER — Telehealth: Payer: Self-pay | Admitting: General Surgery

## 2017-08-15 DIAGNOSIS — R197 Diarrhea, unspecified: Secondary | ICD-10-CM

## 2017-08-15 MED ORDER — OXYCODONE-ACETAMINOPHEN 10-325 MG PO TABS: 1.0000 | ORAL_TABLET | Freq: Four times a day (QID) | ORAL | 0 refills | Status: DC | PRN

## 2017-08-15 NOTE — Telephone Encounter (Signed)
Spoke with patient regarding plan for diagnostic laparoscopy 08/26/2017. He is having some dark diarrhea with tar black stools. No BRBPR.  He is continuing to have abdominal pain and only eating about 1 meal a day.   Discussed risk and benefits of diagnostic laparoscopy including bleeding, infection, risk of injury to organ, risk of not finding anything, risk of needing to convert to large procedure if find something like an area of bowel that needs to be resection or mesh that needs to be removed. Discussed that this may not help him at all.  Have given another refill of pain meds given the continued pain of unknown etiology. Refilled for another week.   Stool studies sent given the diarrhea and the dark stools. His EGD and everything showed no signs of any gastritis/ bleeding, etc.  Dr. Laural Golden did not want to do the capsule study and went to the enterography that did not demonstrate anything.   Stool studies. Pain med written. Warned to limit use as I will be out of town and unable to fill the prescription.  OR for diagnostic laparoscopy 08/26/2017.  Curlene Labrum, MD [facility] [address] [place], [place] [address] [phone] (office)

## 2017-08-16 NOTE — H&P (Signed)
[facility] History and Physical   [name] [name] is a 32 y.o. male.   HPI: [name] [name] is a 19 you with chronic abdominal pain who reports having a pain and discomfort in his epigastric region for several years. This results if episodes of severe abdominal pain, nausea, vomiting and diarrhea and he presents to the hospital with findings of enteritis versus a pSBO.  He has had 2 prior surgeries including a umbilical hernia repair with mesh done emergently it sounds like based on his description of it "rupturing," and a laparoscopic cholecystectomy.  He continues to have these episodes of severe abdominal pain of unknown origin approximately yearly to every 18 months, and requires hospitalization.  During his most recent hospitalization, he underwent a CT that demonstrated duodenitis and jejunitis with an area of pneumatosis. He had normal labs and reassuring vitals and exam, and was monitored with IV antibiotics and made NPO.   Since his discharge, he has continued to have pain, which he reports is unusual for the episodes as it will normally subside after some period of time. He says that the pain is chronic, stabbing, and with every breath he feels like is being stabbed. The pain is also moving more into his left side and down into the abdomen on the left from his epigastric area and up into his chest. He has been seen in the ED since that admission and labs were done which are all normal to date including labs to check for Celiac.  He has been seen by Dr. Laural Golden who completed an EGD / push enteroscopy with only findings of prominent jejunal folds and normal biopsies. He then underwent a CT enterography that demonstrated no abnormality that could explain the previous findings on CT scan.  Due to the concern for the obstruction history and prominent folds, Dr. Laural Golden did not want to pursue any capsule studies.    The patient has since been in contact with me, and I have prescribed him pain  medication due to the continued abdominal pain that is only relieved with the narcotic pain medication. We have discussed the possibility of a diagnostic laparoscopy in order to investigate to see if there is any etiology that can be found to explain his pain and other symptoms.    With each prescription, I have checked the [place] substance abuse website, and confirmed that he was not receiving prescriptions from any other providers, except for Dr. Laural Golden while he was doing his procedures.  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   I last spoke with him on the phone 08/15/2017 and he was describing some dark tar like diarrhea. He has had some reports of melena in the past, but did not reprot any thing to Dr. Laural Golden based on his documentation.  In addition, he has had stool samples done in the past that were normal. I sent him to get stool studies done prior to the diagnostic laparoscopy. These revealed no C dif, No salmonella, no  E. Coli, no Shigella, but Ova and parasites were still pending.   Today he describes continued abdominal pain intermittently in the area, some diarrhea that continues, going 4-5 times per day.    Past Medical History:  Diagnosis Date  . Enteritis   . History of Legg-Calve-Perthes  disease   . Small bowel obstruction [facility])     Past Surgical History:  Procedure Laterality Date  . ABDOMINAL SURGERY    . BIOPSY N/A 08/02/2017   Procedure: BIOPSY;  Surgeon: Rogene Houston, MD;  Location: [facility];  Service: Endoscopy;  Laterality: N/A;  . CHOLECYSTECTOMY    . ENTEROSCOPY N/A 08/02/2017   Procedure: PUSH ENTEROSCOPY;  Surgeon: Rogene Houston, MD;  Location: [facility];  Service: Endoscopy;  Laterality: N/A;  . ESOPHAGOGASTRODUODENOSCOPY (EGD) WITH PROPOFOL N/A 08/02/2017    Procedure: ESOPHAGOGASTRODUODENOSCOPY (EGD) WITH PROPOFOL;  Surgeon: Rogene Houston, MD;  Location: [facility];  Service: Endoscopy;  Laterality: N/A;  8:55am  . HERNIA REPAIR    . HIP ARTHROSCOPY  2018   right hip    Family History  Problem Relation Age of Onset  . Colon cancer Unknown        grandmother  . Colon cancer Paternal Grandmother     Social History   Tobacco Use  . Smoking status: Current Every Day Smoker    Packs/day: 1.00    Years: 7.00    Pack years: 7.00    Types: Cigarettes  . Smokeless tobacco: Never Used  Substance Use Topics  . Alcohol use: Yes    Alcohol/week: 0.0 oz    Comment: once or twice per week  . Drug use: No   Current Facility-Administered Medications  Medication Dose Route Frequency Provider Last Rate Last Dose  . cefoTEtan in Dextrose 5% (CEFOTAN) IVPB 2 g  2 g Intravenous On Call to OR Virl Cagey, MD      . Chlorhexidine Gluconate Cloth 2 % PADS 6 each  6 each Topical Once Virl Cagey, MD       And  . Chlorhexidine Gluconate Cloth 2 % PADS 6 each  6 each Topical Once Virl Cagey, MD      . lactated ringers infusion   Intravenous Continuous Lerry Liner, MD 75 mL/hr at 08/26/17 0950 1,000 mL at 08/26/17 0950  . scopolamine (TRANSDERM-SCOP) 1 MG/3DAYS 1.5 mg  1 patch Transdermal Once Lerry Liner, MD   1.5 mg at 08/26/17 1001   Allergies: No Known Allergies  ROS:  A comprehensive review of systems was negative except for: Gastrointestinal: positive for abdominal pain, diarrhea and anorexia  Blood pressure (!) 109/52, pulse 74, temperature 97.9 F (36.6 C), temperature source Oral, resp. rate 14, height 5\' 10"  (1.778 m), weight 199 lb (90.3 kg), SpO2 96 %.   Physical Exam  Constitutional: He is oriented to person, place, and time and well-developed, well-nourished, and in no distress.  HENT:  Head: Normocephalic.  Eyes: Pupils are equal, round, and reactive to light.  Neck: Normal range of motion.   Cardiovascular: Normal rate and regular rhythm.  Pulmonary/Chest: Effort normal and breath sounds normal.  Abdominal: Soft. He exhibits no distension. There is tenderness. There is no rebound and no guarding.  Healed umbilical scars and port sites, no obvious hernia defects  Musculoskeletal: Normal range of motion. He exhibits no edema.  Neurological: He is alert and oriented to person, place, and time.  Skin: Skin is warm and dry.  Psychiatric: Mood, memory, affect and judgment normal.  Vitals reviewed.    Results: Personally reviewed- no obvious abnormality noted  CT enterography 08/08/2017-  IMPRESSION: Negative. No radiographic evidence of inflammatory bowel disease, intussusception, or other significant abnormality.  EGD/ Enteroscopy- 08/02/2017  Some gastritis, biopsies taken and negative Normal duodenum and jejunum  with some prominent folds, no obvious inflammatory changes   Assessment & Plan:  Abraham Entwistle is a 32 y.o. male with chronic abdominal /epgastric pain with CT evidence most recently 07/2017 and in the past for multiple recurrent pSBO and enteritis of the jejunum related to these episodes. He has been having these episodes in the past, and this last episode has continued to cause him significant pain and is requiring narcotic pain medication. He has had an EGD/ enterography, and no other source for the pain can be found at this time. He is also has started to have some dark/ tarry stools, and stool studies were essentially negative except for the tests that have yet to return. Given everything, we plan to proceed with diagnostic laparoscopy.  He may have some adhesions causing these obstruction versus some issue with the mesh used during his umbilical hernia. It is difficult to say what will be found if anything.  - OR for diagnostic laparoscopy   All questions were answered to the satisfaction of the patient.  The risk and benefits of a diagnostic laparoscopy were  discussed including but not limited to bleeding, infection, risk of injury to organ, risk of not finding anything, risk of needing to convert to large procedure if find something like an area of bowel that needs to be resection or mesh that needs to be removed. Discussed that this may not help him at all. He has expressed understanding and wants to proceed.    Virl Cagey 08/26/2017, 11:24 AM

## 2017-08-21 ENCOUNTER — Encounter (HOSPITAL_COMMUNITY): Payer: Self-pay

## 2017-08-21 ENCOUNTER — Encounter (HOSPITAL_COMMUNITY)
Admission: RE | Admit: 2017-08-21 | Discharge: 2017-08-21 | Disposition: A | Discharge status: Home / Self Care | Payer: 59 | Source: Ambulatory Visit | Attending: General Surgery | Admitting: General Surgery

## 2017-08-21 ENCOUNTER — Other Ambulatory Visit: Payer: Self-pay | Admitting: General Surgery

## 2017-08-21 DIAGNOSIS — R112 Nausea with vomiting, unspecified: Secondary | ICD-10-CM | POA: Diagnosis not present

## 2017-08-21 DIAGNOSIS — R1013 Epigastric pain: Secondary | ICD-10-CM | POA: Diagnosis not present

## 2017-08-21 DIAGNOSIS — R197 Diarrhea, unspecified: Secondary | ICD-10-CM | POA: Diagnosis not present

## 2017-08-26 ENCOUNTER — Encounter (HOSPITAL_COMMUNITY): Payer: Self-pay | Admitting: *Deleted

## 2017-08-26 ENCOUNTER — Encounter (HOSPITAL_COMMUNITY)
Admission: RE | Disposition: A | Discharge status: Home / Self Care | Payer: Self-pay | Source: Ambulatory Visit | Attending: General Surgery

## 2017-08-26 ENCOUNTER — Ambulatory Visit (HOSPITAL_COMMUNITY): Payer: 59 | Admitting: Anesthesiology

## 2017-08-26 ENCOUNTER — Ambulatory Visit (HOSPITAL_COMMUNITY)
Admission: RE | Admit: 2017-08-26 | Discharge: 2017-08-26 | Disposition: A | Discharge status: Home / Self Care | Payer: 59 | Source: Ambulatory Visit | Attending: General Surgery | Admitting: General Surgery

## 2017-08-26 DIAGNOSIS — R1013 Epigastric pain: Secondary | ICD-10-CM | POA: Insufficient documentation

## 2017-08-26 DIAGNOSIS — Z800 Family history of malignant neoplasm of digestive organs: Secondary | ICD-10-CM | POA: Insufficient documentation

## 2017-08-26 DIAGNOSIS — Z9049 Acquired absence of other specified parts of digestive tract: Secondary | ICD-10-CM | POA: Insufficient documentation

## 2017-08-26 DIAGNOSIS — F17210 Nicotine dependence, cigarettes, uncomplicated: Secondary | ICD-10-CM | POA: Diagnosis not present

## 2017-08-26 DIAGNOSIS — R109 Unspecified abdominal pain: Secondary | ICD-10-CM | POA: Diagnosis not present

## 2017-08-26 DIAGNOSIS — R197 Diarrhea, unspecified: Secondary | ICD-10-CM | POA: Diagnosis not present

## 2017-08-26 DIAGNOSIS — G8929 Other chronic pain: Secondary | ICD-10-CM | POA: Insufficient documentation

## 2017-08-26 DIAGNOSIS — M9110 Juvenile osteochondrosis of head of femur [Legg-Calve-Perthes], unspecified leg: Secondary | ICD-10-CM | POA: Insufficient documentation

## 2017-08-26 DIAGNOSIS — R112 Nausea with vomiting, unspecified: Secondary | ICD-10-CM | POA: Insufficient documentation

## 2017-08-26 DIAGNOSIS — K56600 Partial intestinal obstruction, unspecified as to cause: Secondary | ICD-10-CM | POA: Diagnosis not present

## 2017-08-26 HISTORY — PX: LAPAROSCOPY: SHX197

## 2017-08-26 LAB — FECAL LEUKOCYTES

## 2017-08-26 LAB — STOOL CULTURE: E coli, Shiga toxin Assay: NEGATIVE

## 2017-08-26 LAB — CLOSTRIDIUM DIFFICILE EIA: C DIFFICILE TOXINS A+ B, EIA: NEGATIVE

## 2017-08-26 LAB — TYPE AND SCREEN
ABO/RH(D): A POS
Antibody Screen: NEGATIVE

## 2017-08-26 LAB — OVA AND PARASITE EXAMINATION

## 2017-08-26 SURGERY — LAPAROSCOPY, DIAGNOSTIC
Anesthesia: General

## 2017-08-26 MED ORDER — GLYCOPYRROLATE 0.2 MG/ML IJ SOLN
0.2000 mg | Freq: Once | INTRAMUSCULAR | Status: AC
  Administered 2017-08-26: 0.2 mg via INTRAVENOUS

## 2017-08-26 MED ORDER — MIDAZOLAM HCL 2 MG/2ML IJ SOLN
1.0000 mg | INTRAMUSCULAR | Status: AC
  Administered 2017-08-26: 2 mg via INTRAVENOUS

## 2017-08-26 MED ORDER — BUPIVACAINE HCL (PF) 0.5 % IJ SOLN
INTRAMUSCULAR | Status: AC
  Filled 2017-08-26: qty 30

## 2017-08-26 MED ORDER — DEXAMETHASONE SODIUM PHOSPHATE 4 MG/ML IJ SOLN
INTRAMUSCULAR | Status: AC
  Filled 2017-08-26: qty 1

## 2017-08-26 MED ORDER — LIDOCAINE HCL (PF) 1 % IJ SOLN
INTRAMUSCULAR | Status: AC
  Filled 2017-08-26: qty 5

## 2017-08-26 MED ORDER — OXYCODONE-ACETAMINOPHEN 10-325 MG PO TABS: 1.0000 | ORAL_TABLET | Freq: Four times a day (QID) | ORAL | 0 refills | Status: AC | PRN

## 2017-08-26 MED ORDER — LACTATED RINGERS IV SOLN
INTRAVENOUS | Status: DC
  Administered 2017-08-26: 1000 mL via INTRAVENOUS
  Administered 2017-08-26: 13:00:00 via INTRAVENOUS

## 2017-08-26 MED ORDER — FENTANYL CITRATE (PF) 100 MCG/2ML IJ SOLN
INTRAMUSCULAR | Status: DC | PRN
  Administered 2017-08-26 (×5): 50 ug via INTRAVENOUS
  Administered 2017-08-26: 100 ug via INTRAVENOUS

## 2017-08-26 MED ORDER — CEFOTETAN DISODIUM-DEXTROSE 2-2.08 GM-%(50ML) IV SOLR
2.0000 g | INTRAVENOUS | Status: AC
  Administered 2017-08-26: 2 g via INTRAVENOUS
  Filled 2017-08-26: qty 50

## 2017-08-26 MED ORDER — SCOPOLAMINE 1 MG/3DAYS TD PT72
MEDICATED_PATCH | TRANSDERMAL | Status: AC
Start: 2017-08-26 — End: ?
  Filled 2017-08-26: qty 1

## 2017-08-26 MED ORDER — PROPOFOL 10 MG/ML IV BOLUS
INTRAVENOUS | Status: AC
  Filled 2017-08-26: qty 20

## 2017-08-26 MED ORDER — PROPOFOL 10 MG/ML IV BOLUS
INTRAVENOUS | Status: DC | PRN
  Administered 2017-08-26: 200 mg via INTRAVENOUS

## 2017-08-26 MED ORDER — LIDOCAINE HCL 1 % IJ SOLN
INTRAMUSCULAR | Status: DC | PRN
  Administered 2017-08-26: 30 mg via INTRADERMAL

## 2017-08-26 MED ORDER — SUCCINYLCHOLINE CHLORIDE 20 MG/ML IJ SOLN
INTRAMUSCULAR | Status: DC | PRN
  Administered 2017-08-26: 140 mg via INTRAVENOUS

## 2017-08-26 MED ORDER — MIDAZOLAM HCL 5 MG/5ML IJ SOLN
INTRAMUSCULAR | Status: DC | PRN
  Administered 2017-08-26: 2 mg via INTRAVENOUS

## 2017-08-26 MED ORDER — 0.9 % SODIUM CHLORIDE (POUR BTL) OPTIME
TOPICAL | Status: DC | PRN
  Administered 2017-08-26: 1000 mL

## 2017-08-26 MED ORDER — FENTANYL CITRATE (PF) 250 MCG/5ML IJ SOLN
INTRAMUSCULAR | Status: AC
  Filled 2017-08-26: qty 5

## 2017-08-26 MED ORDER — MIDAZOLAM HCL 2 MG/2ML IJ SOLN
INTRAMUSCULAR | Status: AC
  Filled 2017-08-26: qty 2

## 2017-08-26 MED ORDER — CHLORHEXIDINE GLUCONATE CLOTH 2 % EX PADS: 6.0000 | MEDICATED_PAD | Freq: Once | CUTANEOUS | Status: DC

## 2017-08-26 MED ORDER — ROCURONIUM BROMIDE 100 MG/10ML IV SOLN
INTRAVENOUS | Status: DC | PRN
  Administered 2017-08-26: 5 mg via INTRAVENOUS
  Administered 2017-08-26: 25 mg via INTRAVENOUS
  Administered 2017-08-26: 10 mg via INTRAVENOUS

## 2017-08-26 MED ORDER — ONDANSETRON HCL 4 MG PO TABS: 4.0000 mg | ORAL_TABLET | Freq: Three times a day (TID) | ORAL | 0 refills | Status: AC | PRN

## 2017-08-26 MED ORDER — GLYCOPYRROLATE 0.2 MG/ML IJ SOLN
INTRAMUSCULAR | Status: AC
  Filled 2017-08-26: qty 1

## 2017-08-26 MED ORDER — SUGAMMADEX SODIUM 200 MG/2ML IV SOLN
INTRAVENOUS | Status: DC | PRN
  Administered 2017-08-26: 200 mg via INTRAVENOUS

## 2017-08-26 MED ORDER — HEPARIN SODIUM (PORCINE) 5000 UNIT/ML IJ SOLN
5000.0000 [IU] | Freq: Once | INTRAMUSCULAR | Status: AC
  Administered 2017-08-26: 5000 [IU] via SUBCUTANEOUS
  Filled 2017-08-26 (×2): qty 1

## 2017-08-26 MED ORDER — BUPIVACAINE HCL (PF) 0.5 % IJ SOLN
INTRAMUSCULAR | Status: DC | PRN
  Administered 2017-08-26: 15 mL

## 2017-08-26 MED ORDER — ROCURONIUM BROMIDE 50 MG/5ML IV SOLN
INTRAVENOUS | Status: AC
  Filled 2017-08-26: qty 1

## 2017-08-26 MED ORDER — ONDANSETRON HCL 4 MG/2ML IJ SOLN
4.0000 mg | Freq: Once | INTRAMUSCULAR | Status: AC
  Administered 2017-08-26: 4 mg via INTRAVENOUS

## 2017-08-26 MED ORDER — DEXAMETHASONE SODIUM PHOSPHATE 4 MG/ML IJ SOLN
4.0000 mg | Freq: Once | INTRAMUSCULAR | Status: AC
  Administered 2017-08-26: 4 mg via INTRAVENOUS

## 2017-08-26 MED ORDER — ONDANSETRON HCL 4 MG/2ML IJ SOLN
INTRAMUSCULAR | Status: AC
  Filled 2017-08-26: qty 2

## 2017-08-26 MED ORDER — FENTANYL CITRATE (PF) 100 MCG/2ML IJ SOLN
INTRAMUSCULAR | Status: AC
  Filled 2017-08-26: qty 2

## 2017-08-26 MED ORDER — SCOPOLAMINE 1 MG/3DAYS TD PT72
1.0000 | MEDICATED_PATCH | Freq: Once | TRANSDERMAL | Status: DC
  Administered 2017-08-26: 1.5 mg via TRANSDERMAL

## 2017-08-26 SURGICAL SUPPLY — 40 items
ADH SKN CLS APL DERMABOND .7 (GAUZE/BANDAGES/DRESSINGS) ×1
BAG HAMPER (MISCELLANEOUS) ×3 IMPLANT
BAG RETRIEVAL 10MM (BASKET) ×1
BLADE SURG 15 STRL LF DISP TIS (BLADE) ×1 IMPLANT
BLADE SURG 15 STRL SS (BLADE) ×3
CHLORAPREP W/TINT 26ML (MISCELLANEOUS) ×3 IMPLANT
CLOTH BEACON ORANGE TIMEOUT ST (SAFETY) ×3 IMPLANT
COVER LIGHT HANDLE STERIS (MISCELLANEOUS) ×6 IMPLANT
DECANTER SPIKE VIAL GLASS SM (MISCELLANEOUS) ×3 IMPLANT
DERMABOND ADVANCED (GAUZE/BANDAGES/DRESSINGS) ×2
DERMABOND ADVANCED .7 DNX12 (GAUZE/BANDAGES/DRESSINGS) ×1 IMPLANT
ELECT REM PT RETURN 9FT ADLT (ELECTROSURGICAL) ×3
ELECTRODE REM PT RTRN 9FT ADLT (ELECTROSURGICAL) ×1 IMPLANT
GLOVE BIO SURGEON STRL SZ 6.5 (GLOVE) ×4 IMPLANT
GLOVE BIO SURGEONS STRL SZ 6.5 (GLOVE) ×2
GLOVE BIOGEL PI IND STRL 6.5 (GLOVE) ×1 IMPLANT
GLOVE BIOGEL PI IND STRL 7.0 (GLOVE) ×1 IMPLANT
GLOVE BIOGEL PI INDICATOR 6.5 (GLOVE) ×2
GLOVE BIOGEL PI INDICATOR 7.0 (GLOVE) ×6
GLOVE ECLIPSE 6.5 STRL STRAW (GLOVE) ×2 IMPLANT
GLOVE ECLIPSE 7.0 STRL STRAW (GLOVE) ×4 IMPLANT
GOWN STRL REUS W/TWL LRG LVL3 (GOWN DISPOSABLE) ×6 IMPLANT
INST SET LAPROSCOPIC AP (KITS) ×2 IMPLANT
KIT ROOM TURNOVER APOR (KITS) ×3 IMPLANT
MANIFOLD NEPTUNE II (INSTRUMENTS) ×3 IMPLANT
NDL INSUFFLATION 14GA 120MM (NEEDLE) ×1 IMPLANT
NEEDLE INSUFFLATION 14GA 120MM (NEEDLE) ×3 IMPLANT
NS IRRIG 1000ML POUR BTL (IV SOLUTION) ×3 IMPLANT
PACK LAP CHOLE LZT030E (CUSTOM PROCEDURE TRAY) ×3 IMPLANT
PAD ARMBOARD 7.5X6 YLW CONV (MISCELLANEOUS) ×3 IMPLANT
SET BASIN LINEN APH (SET/KITS/TRAYS/PACK) ×3 IMPLANT
SLEEVE ENDOPATH XCEL 5M (ENDOMECHANICALS) ×2 IMPLANT
SUT MNCRL AB 4-0 PS2 18 (SUTURE) ×3 IMPLANT
SUT VICRYL 0 UR6 27IN ABS (SUTURE) ×2 IMPLANT
SYS BAG RETRIEVAL 10MM (BASKET) ×2
SYSTEM BAG RETRIEVAL 10MM (BASKET) ×1 IMPLANT
TRAY FOLEY CATH SILVER 16FR (SET/KITS/TRAYS/PACK) ×3 IMPLANT
TROCAR ENDO BLADELESS 11MM (ENDOMECHANICALS) ×2 IMPLANT
TROCAR XCEL NON-BLD 5MMX100MML (ENDOMECHANICALS) ×3 IMPLANT
WARMER LAPAROSCOPE (MISCELLANEOUS) ×3 IMPLANT

## 2017-08-26 NOTE — Op Note (Signed)
[facility] Date of Surgery: 08/26/2017   Pre-operative Diagnosis: Chronic abdominal pain of unknown etiology; chronic partial small bowel obstruction  Post-operative Diagnosis: Chronic abdominal pain of unknown etiology, negative diagnostic laparoscopy   Procedure Performed: Diagnostic laparoscopy  Surgeon: Lanell Matar. Constance Haw, MD   Assistant: No qualified resident was available  Anesthesia: General   Findings: Normal colon with prominent haustra, some hard stool in ascending colon; normal appendix, normal liver, normal stomach, small bowel normal with some areas of slightly dilated and fluid filled small bowel, but no adhesions or strictures or signs of inflammation or stenosis, normal spleen   1) Entry to abdomen- no obvious adhesions or scarring; 2) Normal stomach    3.) Normal liver;  4.) Normal small bowel    5.) Umbilical hernia repair site, no adhesions or scar, no exposed mesh; 6.) Slightly dilated fluid filled small bowel with no stricture, no inflammation, no adhesions proximal or distal to the area; essentially normal small bowel    7.) Normal cecum and appendix; 8.) Normal ligament of Treitz   9.) Ascending colon with prominent haustra and solid stool; 10.) Normal spleen    11.) Normal transverse colon; 12.) Normal splenic flexure/ descending colon   13.) Normal stomach     Estimated Blood Loss: 5cc   Specimens: None  Complications: None; patient tolerated the procedure well.   Disposition: PACU to Home   Condition: Stable   Indications:  Mr. Bernardini is a 32 year old with chronic abdominal pain and episodes of severe abdominal pain, nausea, vomiting and diarrhea causing him to present to the hospital with CT findings of enteritis versus a pSBO. He has had 2 prior surgeries including a umbilical hernia repair with mesh done emergently, and a laparoscopic cholecystectomy following the umbilical hernia repair. He has been seen by GI and  underwent an EGD with enteroscopy that demonstrated some prominent jejunal folds and normal biopsies.  A CT enterography demonstrated no abnormality that could explain the previous findings on CT scan.  Due to the concern for the obstruction history and prominent folds, Dr. Laural Golden did not want to pursue any capsule studies. He has undergone stool studies that demonstrate negative results to date, and he continues to have these episodes of severe abdominal pain of unknown origin approximately yearly to every 18 months, and requires hospitalization and narcotic pain medication.   Given this chronic abdominal pain and concern that he could potentially have some adhesions causing the pSBO versus some issue with his umbilical hernia mesh, we discussed the risk and benefits of undergoing a diagnostic laparoscopy, including but not limited to bleeding, infection, risk of injury to organ, risk of not finding anything, risk of needing to convert to large procedure or need for mesh removal. We discussed that this may not help him at all.  He and his wife have expressed understanding and want to proceed.   Procedure:   The patient was placed in the supine position and general anesthesia was induced, along with placement of orogastric tube, SCD's, and a Foley catheter. The abdomen was prepped and draped in a sterile fashion. The abdomen was entered with Veress technique in the left upper quadrant. Intraperitoneal placement was confirmed with saline drop, low entry pressures, and easy insufflation. An incision was made at the site of the Veress entry due to the prior umbilical hernia repair and cholecystectomy scar at the supraumbilical region and fear of injuring bowel if there were any adhesions.  The 11 mm optiview trocar  was placed under direct visualization with a 0 degree scope. The 10 mm 0 degree scope was placed in the abdomen and no evidence of injury was identified. A 5 mm port was placed in the left lower  quadrant of the abdomen after skin incision with trocar placement under direct vision.  An additional 5 mm port was placed in the suprapubic area under direct vision.  The abdomen was inspected noting a normal liver, normal anterior abdominal wall, and no obvious adhesions or scar tissue. There appeared to be no interloop adhesions.  These findings were documented photographically.  The patient was placed in Trendelenburg and left lateral decubitus position. The cecum and appendix were identified and appeared normal. The terminal ileum was identified, and the small bowel was ran with care using bowel graspers. The bowel was placed in the right upper quadrant as it was run proximally.  There were no signs of inflammation, no scarring, no signs of a Meckel's, no signs of any strictures or stenosis.  There was a portion of small bowel in the mid bowel that was slightly dilated and fluid filled but there was no findings distal or proximal to this area. The bowel otherwise appeared to have prominent peristalsis.  The bowel was ran to the Ligament of Treitz which was normal, and the ascending, transverse, and descending colon were inspected and appeared grossly normal.  The spleen was noted to be normal, and the stomach was normal.  The patient never had any signs or laboratory findings of pancreatitis, and I felt that the risk of injury outweighed the benefits of looking in the lesser sac.  At the completion of running the small bowel, the 5 mm trocars were removed under direct visualization.  Final inspection revealed adequate hemostasis and no signs of any injury to the bowel.  The pneumoperitoneum was released, and the 38mm trocar site was closed with a 0 Vicryl suture.   The skin was closed with 4-0 Monocryl and dermabond. The patient was then awakened from general anesthesia, extubated, and taken to PACU for recovery.   Instrument, sponge, and needle counts were correct at the conclusion of the case.   I  updated the wife regarding the negative findings, and updated Dr. Laural Golden.  At this time, we still have no definitive etiology for the abdominal pain, and the patient will likely require further testing.   Curlene Labrum, MD [facility] [address] [place], [place] [address] [phone] (office)

## 2017-08-26 NOTE — Anesthesia Postprocedure Evaluation (Signed)
Anesthesia Post Note  Patient: [name] [name]  Procedure(s) Performed: DIAGNOSTIC LAPAROSCOPY (N/A )  Patient location during evaluation: PACU Anesthesia Type: General Level of consciousness: awake and alert and patient cooperative Pain management: pain level controlled Vital Signs Assessment: post-procedure vital signs reviewed and stable Respiratory status: spontaneous breathing, nonlabored ventilation and respiratory function stable Cardiovascular status: blood pressure returned to baseline Postop Assessment: no apparent nausea or vomiting Anesthetic complications: no     Last Vitals:  Vitals:   08/26/17 1414 08/26/17 1415  BP: (!) 112/52 (!) 105/39  Pulse: (!) 54 (!) 54  Resp: 11   Temp:  36.7 C  SpO2: 96% 98%    Last Pain:  Vitals:   08/26/17 1415  TempSrc: Oral  PainSc: 7                  Zianna Dercole J

## 2017-08-26 NOTE — Transfer of Care (Signed)
Immediate Anesthesia Transfer of Care Note  Patient: [name] [name]  Procedure(s) Performed: DIAGNOSTIC LAPAROSCOPY (N/A )  Patient Location: PACU  Anesthesia Type:General  Level of Consciousness: awake and patient cooperative  Airway & Oxygen Therapy: Patient Spontanous Breathing and Patient connected to face mask oxygen  Post-op Assessment: Report given to RN, Post -op Vital signs reviewed and stable and Patient moving all extremities  Post vital signs: Reviewed and stable  Last Vitals:  Vitals:   08/26/17 1145 08/26/17 1150  BP: (!) 102/56 101/66  Pulse:    Resp: 15 11  Temp:    SpO2: 97% 98%    Last Pain:  Vitals:   08/26/17 0911  TempSrc: Oral  PainSc: 8       Patients Stated Pain Goal: 7 (70/01/74 9449)  Complications: No apparent anesthesia complications

## 2017-08-26 NOTE — Anesthesia Preprocedure Evaluation (Signed)
Anesthesia Evaluation  Patient identified by MRN, date of birth, ID band Patient awake    Reviewed: Allergy & Precautions, NPO status , Patient's Chart, lab work & pertinent test results  Airway Mallampati: II  TM Distance: >3 FB Neck ROM: Full    Dental  (+) Teeth Intact   Pulmonary Current Smoker,    breath sounds clear to auscultation       Cardiovascular negative cardio ROS   Rhythm:Regular Rate:Normal     Neuro/Psych    GI/Hepatic   Endo/Other  negative endocrine ROS  Renal/GU negative Renal ROS     Musculoskeletal   Abdominal   Peds  Hematology negative hematology ROS (+)   Anesthesia Other Findings Chronic abdominal pain.....  Reproductive/Obstetrics                             Anesthesia Physical Anesthesia Plan  ASA: II  Anesthesia Plan: General   Post-op Pain Management:    Induction: Intravenous, Rapid sequence and Cricoid pressure planned  PONV Risk Score and Plan:   Airway Management Planned: Oral ETT  Additional Equipment:   Intra-op Plan:   Post-operative Plan: Extubation in OR  Informed Consent: I have reviewed the patients History and Physical, chart, labs and discussed the procedure including the risks, benefits and alternatives for the proposed anesthesia with the patient or authorized representative who has indicated his/her understanding and acceptance.     Plan Discussed with:   Anesthesia Plan Comments:         Anesthesia Quick Evaluation

## 2017-08-26 NOTE — Interval H&P Note (Signed)
History and Physical Interval Note:  08/26/2017 11:26 AM  [name] [name]  has presented today for surgery, with the diagnosis of partial small bowel obstruction, abdominal pain, intermittent small bowel obstruction due to adhesions  The various methods of treatment have been discussed with the patient and family. After consideration of risks, benefits and other options for treatment, the patient has consented to  Procedure(s): LAPAROSCOPY DIAGNOSTIC (N/A) as a surgical intervention .  The patient's history has been reviewed, patient examined, no change in status, stable for surgery.  I have reviewed the patient's chart and labs.  Questions were answered to the patient's satisfaction.     Virl Cagey

## 2017-08-26 NOTE — Anesthesia Procedure Notes (Signed)
Procedure Name: Intubation Date/Time: 08/26/2017 12:06 PM Performed by: Charmaine Downs, CRNA Pre-anesthesia Checklist: Patient identified, Emergency Drugs available, Suction available and Patient being monitored Patient Re-evaluated:Patient Re-evaluated prior to induction Oxygen Delivery Method: Circle system utilized Preoxygenation: Pre-oxygenation with 100% oxygen Induction Type: IV induction, Rapid sequence and Cricoid Pressure applied Ventilation: Mask ventilation without difficulty and Oral airway inserted - appropriate to patient size Laryngoscope Size: Mac and 4 Grade View: Grade II Tube size: 8.0 mm Number of attempts: 1 Airway Equipment and Method: Stylet Placement Confirmation: ETT inserted through vocal cords under direct vision,  positive ETCO2 and breath sounds checked- equal and bilateral Secured at: 22 cm Tube secured with: Tape Dental Injury: Teeth and Oropharynx as per pre-operative assessment

## 2017-08-26 NOTE — Discharge Instructions (Signed)
Discharge Instructions: Shower per your regular routine. Take tylenol and ibuprofen as needed for pain control, alternating every 4-6 hours.  Take Roxicodone for breakthrough pain. Take colace for constipation related to narcotic pain medication. Do not pick at the dermabond glue on your incision sites.   Diagnostic Laparoscopy, Care After Refer to this sheet in the next few weeks. These instructions provide you with information about caring for yourself after your procedure. Your health care provider may also give you more specific instructions. Your treatment has been planned according to current medical practices, but problems sometimes occur. Call your health care provider if you have any problems or questions after your procedure. What can I expect after the procedure? After your procedure, it is common to have mild discomfort in the throat and abdomen. Follow these instructions at home:  Take over-the-counter and prescription medicines only as told by your health care provider.  Do not drive for 24 hours if you received a sedative.  Return to your normal activities as told by your health care provider.  Do not take baths, swim, or use a hot tub until your health care provider approves. You may shower.  Follow instructions from your health care provider about how to take care of your incision. Make sure you: ? Wash your hands with soap and water before you change your bandage (dressing). If soap and water are not available, use hand sanitizer. ? Change your dressing as told by your health care provider. ? Leave stitches (sutures), skin glue, or adhesive strips in place. These skin closures may need to stay in place for 2 weeks or longer. If adhesive strip edges start to loosen and curl up, you may trim the loose edges. Do not remove adhesive strips completely unless your health care provider tells you to do that.  Check your incision area every day for signs of infection. Check  for: ? More redness, swelling, or pain. ? More fluid or blood. ? Warmth. ? Pus or a bad smell.  It is your responsibility to get the results of your procedure. Ask your health care provider or the department performing the procedure when your results will be ready. Contact a health care provider if:  There is new pain in your shoulders.  You feel light-headed or faint.  You are unable to pass gas or unable to have a bowel movement.  You feel nauseous or you vomit.  You develop a rash.  You have more redness, swelling, or pain around your incision.  You have more fluid or blood coming from your incision.  Your incision feels warm to the touch.  You have pus or a bad smell coming from your incision.  You have a fever or chills. Get help right away if:  Your pain is getting worse.  You have ongoing vomiting.  The edges of your incision open up.  You have trouble breathing.  You have chest pain. This information is not intended to replace advice given to you by your health care provider. Make sure you discuss any questions you have with your health care provider. Document Released: 07/11/2015 Document Revised: 01/05/2016 Document Reviewed: 04/12/2015 Elsevier Interactive Patient Education  2018 [employer].

## 2017-08-27 ENCOUNTER — Encounter (HOSPITAL_COMMUNITY): Payer: Self-pay | Admitting: General Surgery

## 2017-08-28 ENCOUNTER — Other Ambulatory Visit: Payer: Self-pay

## 2017-08-28 ENCOUNTER — Emergency Department (HOSPITAL_COMMUNITY): Payer: 59

## 2017-08-28 ENCOUNTER — Telehealth: Payer: Self-pay | Admitting: General Surgery

## 2017-08-28 ENCOUNTER — Encounter (HOSPITAL_COMMUNITY): Payer: Self-pay | Admitting: Emergency Medicine

## 2017-08-28 ENCOUNTER — Observation Stay (HOSPITAL_COMMUNITY)
Admission: EM | Admit: 2017-08-28 | Discharge: 2017-08-30 | Disposition: A | Discharge status: Home / Self Care | Payer: 59 | Attending: General Surgery | Admitting: General Surgery

## 2017-08-28 DIAGNOSIS — F17210 Nicotine dependence, cigarettes, uncomplicated: Secondary | ICD-10-CM | POA: Diagnosis not present

## 2017-08-28 DIAGNOSIS — R109 Unspecified abdominal pain: Secondary | ICD-10-CM | POA: Diagnosis present

## 2017-08-28 DIAGNOSIS — Z23 Encounter for immunization: Secondary | ICD-10-CM | POA: Insufficient documentation

## 2017-08-28 DIAGNOSIS — R1084 Generalized abdominal pain: Principal | ICD-10-CM | POA: Insufficient documentation

## 2017-08-28 DIAGNOSIS — R110 Nausea: Secondary | ICD-10-CM | POA: Insufficient documentation

## 2017-08-28 DIAGNOSIS — K644 Residual hemorrhoidal skin tags: Secondary | ICD-10-CM | POA: Insufficient documentation

## 2017-08-28 DIAGNOSIS — K56699 Other intestinal obstruction unspecified as to partial versus complete obstruction: Secondary | ICD-10-CM | POA: Diagnosis not present

## 2017-08-28 DIAGNOSIS — G8918 Other acute postprocedural pain: Secondary | ICD-10-CM | POA: Diagnosis not present

## 2017-08-28 DIAGNOSIS — Z9049 Acquired absence of other specified parts of digestive tract: Secondary | ICD-10-CM | POA: Diagnosis not present

## 2017-08-28 DIAGNOSIS — Z79899 Other long term (current) drug therapy: Secondary | ICD-10-CM | POA: Insufficient documentation

## 2017-08-28 DIAGNOSIS — D125 Benign neoplasm of sigmoid colon: Secondary | ICD-10-CM | POA: Diagnosis not present

## 2017-08-28 DIAGNOSIS — K921 Melena: Secondary | ICD-10-CM | POA: Diagnosis not present

## 2017-08-28 DIAGNOSIS — R197 Diarrhea, unspecified: Secondary | ICD-10-CM | POA: Insufficient documentation

## 2017-08-28 DIAGNOSIS — Z800 Family history of malignant neoplasm of digestive organs: Secondary | ICD-10-CM | POA: Insufficient documentation

## 2017-08-28 DIAGNOSIS — G8929 Other chronic pain: Secondary | ICD-10-CM | POA: Diagnosis not present

## 2017-08-28 LAB — CBC WITH DIFFERENTIAL/PLATELET
Basophils Absolute: 0 10*3/uL (ref 0.0–0.1)
Basophils Relative: 0 %
EOS PCT: 0 %
Eosinophils Absolute: 0 10*3/uL (ref 0.0–0.7)
HCT: 44.8 % (ref 39.0–52.0)
Hemoglobin: 15.1 g/dL (ref 13.0–17.0)
LYMPHS PCT: 31 %
Lymphs Abs: 1.9 10*3/uL (ref 0.7–4.0)
MCH: 29.5 pg (ref 26.0–34.0)
MCHC: 33.7 g/dL (ref 30.0–36.0)
MCV: 87.7 fL (ref 78.0–100.0)
MONO ABS: 0.5 10*3/uL (ref 0.1–1.0)
MONOS PCT: 9 %
Neutro Abs: 3.8 10*3/uL (ref 1.7–7.7)
Neutrophils Relative %: 60 %
PLATELETS: 336 10*3/uL (ref 150–400)
RBC: 5.11 MIL/uL (ref 4.22–5.81)
RDW: 12.6 % (ref 11.5–15.5)
WBC: 6.3 10*3/uL (ref 4.0–10.5)

## 2017-08-28 LAB — COMPREHENSIVE METABOLIC PANEL
ALK PHOS: 54 U/L (ref 38–126)
ALT: 15 U/L — ABNORMAL LOW (ref 17–63)
AST: 20 U/L (ref 15–41)
Albumin: 4.5 g/dL (ref 3.5–5.0)
Anion gap: 11 (ref 5–15)
BILIRUBIN TOTAL: 0.9 mg/dL (ref 0.3–1.2)
BUN: 13 mg/dL (ref 6–20)
CALCIUM: 9.5 mg/dL (ref 8.9–10.3)
CO2: 27 mmol/L (ref 22–32)
Chloride: 100 mmol/L — ABNORMAL LOW (ref 101–111)
Creatinine, Ser: 0.92 mg/dL (ref 0.61–1.24)
GFR calc Af Amer: >60 mL/min (ref >60)
Glucose, Bld: 88 mg/dL (ref 65–99)
POTASSIUM: 3.5 mmol/L (ref 3.5–5.1)
Sodium: 138 mmol/L (ref 135–145)
TOTAL PROTEIN: 7.9 g/dL (ref 6.5–8.1)

## 2017-08-28 LAB — RAPID URINE DRUG SCREEN, HOSP PERFORMED
Amphetamines: NOT DETECTED
Barbiturates: NOT DETECTED
Benzodiazepines: POSITIVE — AB
Cocaine: NOT DETECTED
OPIATES: POSITIVE — AB
Tetrahydrocannabinol: POSITIVE — AB

## 2017-08-28 LAB — URINALYSIS, ROUTINE W REFLEX MICROSCOPIC
Bacteria, UA: NONE SEEN
Bilirubin Urine: NEGATIVE
Glucose, UA: NEGATIVE mg/dL
Ketones, ur: NEGATIVE mg/dL
Leukocytes, UA: NEGATIVE
Nitrite: NEGATIVE
PROTEIN: NEGATIVE mg/dL
SPECIFIC GRAVITY, URINE: 1.019 (ref 1.005–1.030)
SQUAMOUS EPITHELIAL / LPF: NONE SEEN
pH: 6 (ref 5.0–8.0)

## 2017-08-28 LAB — LIPASE, BLOOD: LIPASE: 24 U/L (ref 11–51)

## 2017-08-28 LAB — LACTIC ACID, PLASMA
Lactic Acid, Venous: 1 mmol/L (ref 0.5–1.9)
Lactic Acid, Venous: 1.1 mmol/L (ref 0.5–1.9)

## 2017-08-28 LAB — MRSA PCR SCREENING: MRSA by PCR: NEGATIVE

## 2017-08-28 MED ORDER — ONDANSETRON HCL 4 MG/2ML IJ SOLN
4.0000 mg | Freq: Four times a day (QID) | INTRAMUSCULAR | Status: DC | PRN
  Administered 2017-08-28: 4 mg via INTRAVENOUS
  Filled 2017-08-28: qty 2

## 2017-08-28 MED ORDER — DIPHENHYDRAMINE HCL 12.5 MG/5ML PO ELIX: 12.5000 mg | ORAL_SOLUTION | Freq: Four times a day (QID) | ORAL | Status: DC | PRN

## 2017-08-28 MED ORDER — LACTATED RINGERS IV SOLN
INTRAVENOUS | Status: DC
  Administered 2017-08-28 – 2017-08-29 (×3): via INTRAVENOUS

## 2017-08-28 MED ORDER — OXYCODONE-ACETAMINOPHEN 5-325 MG PO TABS
1.0000 | ORAL_TABLET | Freq: Four times a day (QID) | ORAL | Status: DC | PRN
Start: 2017-08-28 — End: 2017-08-29
  Administered 2017-08-28 – 2017-08-29 (×2): 1 via ORAL
  Filled 2017-08-28 (×3): qty 1

## 2017-08-28 MED ORDER — OXYCODONE-ACETAMINOPHEN 10-325 MG PO TABS: 1.0000 | ORAL_TABLET | Freq: Four times a day (QID) | ORAL | Status: DC | PRN

## 2017-08-28 MED ORDER — SIMETHICONE 80 MG PO CHEW: 40.0000 mg | CHEWABLE_TABLET | Freq: Four times a day (QID) | ORAL | Status: DC | PRN

## 2017-08-28 MED ORDER — HYDROMORPHONE HCL 1 MG/ML IJ SOLN
0.5000 mg | INTRAMUSCULAR | Status: DC | PRN
  Administered 2017-08-28 – 2017-08-30 (×11): 0.5 mg via INTRAVENOUS
  Filled 2017-08-28 (×12): qty 1

## 2017-08-28 MED ORDER — IOPAMIDOL (ISOVUE-300) INJECTION 61%
100.0000 mL | Freq: Once | INTRAVENOUS | Status: AC | PRN
  Administered 2017-08-28: 100 mL via INTRAVENOUS

## 2017-08-28 MED ORDER — DOCUSATE SODIUM 100 MG PO CAPS
100.0000 mg | ORAL_CAPSULE | Freq: Two times a day (BID) | ORAL | Status: DC
  Administered 2017-08-29 (×2): 100 mg via ORAL
  Filled 2017-08-28 (×2): qty 1

## 2017-08-28 MED ORDER — DIPHENHYDRAMINE HCL 50 MG/ML IJ SOLN: 12.5000 mg | Freq: Four times a day (QID) | INTRAMUSCULAR | Status: DC | PRN

## 2017-08-28 MED ORDER — INFLUENZA VAC SPLIT QUAD 0.5 ML IM SUSY
0.5000 mL | PREFILLED_SYRINGE | INTRAMUSCULAR | Status: AC
  Administered 2017-08-29: 0.5 mL via INTRAMUSCULAR
  Filled 2017-08-28: qty 0.5

## 2017-08-28 MED ORDER — ONDANSETRON HCL 4 MG/2ML IJ SOLN
4.0000 mg | INTRAMUSCULAR | Status: DC | PRN
  Administered 2017-08-28: 4 mg via INTRAVENOUS
  Filled 2017-08-28: qty 2

## 2017-08-28 MED ORDER — FENTANYL CITRATE (PF) 100 MCG/2ML IJ SOLN
50.0000 ug | INTRAMUSCULAR | Status: AC | PRN
  Administered 2017-08-28 (×2): 50 ug via INTRAVENOUS
  Filled 2017-08-28 (×2): qty 2

## 2017-08-28 MED ORDER — ENOXAPARIN SODIUM 40 MG/0.4ML ~~LOC~~ SOLN
40.0000 mg | SUBCUTANEOUS | Status: DC
  Filled 2017-08-28 (×2): qty 0.4

## 2017-08-28 MED ORDER — SUMATRIPTAN SUCCINATE 25 MG PO TABS
25.0000 mg | ORAL_TABLET | Freq: Once | ORAL | Status: AC
  Administered 2017-08-28: 25 mg via ORAL
  Filled 2017-08-28: qty 1

## 2017-08-28 MED ORDER — ONDANSETRON 4 MG PO TBDP: 4.0000 mg | ORAL_TABLET | Freq: Four times a day (QID) | ORAL | Status: DC | PRN

## 2017-08-28 MED ORDER — OXYCODONE HCL 5 MG PO TABS
5.0000 mg | ORAL_TABLET | Freq: Four times a day (QID) | ORAL | Status: DC | PRN
  Filled 2017-08-28: qty 1

## 2017-08-28 MED ORDER — SUMATRIPTAN SUCCINATE 50 MG PO TABS: 25.0000 mg | ORAL_TABLET | ORAL | Status: DC | PRN

## 2017-08-28 MED ORDER — PANTOPRAZOLE SODIUM 40 MG PO TBEC
40.0000 mg | DELAYED_RELEASE_TABLET | Freq: Every day | ORAL | Status: DC
  Administered 2017-08-28 – 2017-08-29 (×2): 40 mg via ORAL
  Filled 2017-08-28 (×2): qty 1

## 2017-08-28 NOTE — Telephone Encounter (Signed)
[facility]  Patient mother called this AM to the office. Says he is having severe pain. That he is not eating and has not had a BM. He feels worse than he did prior to the diagnostic laparoscopy. He has no documented fevers but feels like he could be febrile.    He refused to go to the ED earlier in the AM when his mother told him to go.   Given the pain and the concern for fever, he needs to go to the ED to be worked up. He could have a bowel injury from the diagnostic laparoscopy that we unnoticed.  They will bring him to the ED.  Curlene Labrum, MD [facility] [address] [place], [place] [address] [phone] (office)

## 2017-08-28 NOTE — H&P (Signed)
[facility] History and Physical  Chief Complaint    Post-op Problem      [name] [name] is a 32 y.o. male.  HPI: [name] [name] is well known to me and has a history of chronic abdominal pain of unknown etiology who has had CT scans in the past that demonstrated pSBO and enteritis, and underwent a diagnostic laparoscopy Monday 08/26/2017 to evaluate for any potential source for the chronic abdominal pain and findings of some pSBO/ thickened jejunum on CT scans. The diagnostic laparoscopy was negative, and no obvious etiology was identified. He has previously underwent EGD with enteroscopy, CT enterography, and no etiology has been discovered.   He reports that post operatively he has been having shoulder pain, but also started to have the same stabbing epigastric pain that has brought him in during his prior admissions.  He says that it feels like someone is taking a knife and pushing it into his abdomen.  He has not had a BM since the OR, and was having diarrhea prior to the OR. He has had no vomiting but some nausea.   He is tearful and his mother reports that his pain is so severe that he is feeling very depressed about his current state and living with pain for the remainder of his life.  I have spoke with Dr. Laural Golden post operatively and notified him of the findings, and he has plans to see the patient and perform a colonoscopy as an outpatient, and run tests for Porphyria.   I have also discussed the possibility of something like abdominal migraines, and the patient's brother actually had these as a child. He denies any marijuana use but does [place] CBD oil in the vape pens daily.   He also had a small segment of intussusception on one of the CT but nothing noted in the Diagnostic laparoscopy.    Past Medical History:  Diagnosis Date  . Enteritis   . History of Legg-Calve-Perthes disease   . Small bowel obstruction [facility])     Past Surgical History:  Procedure Laterality Date  .  ABDOMINAL SURGERY    . BIOPSY N/A 08/02/2017   Procedure: BIOPSY;  Surgeon: Rogene Houston, MD;  Location: [facility];  Service: Endoscopy;  Laterality: N/A;  . CHOLECYSTECTOMY    . ENTEROSCOPY N/A 08/02/2017   Procedure: PUSH ENTEROSCOPY;  Surgeon: Rogene Houston, MD;  Location: [facility];  Service: Endoscopy;  Laterality: N/A;  . ESOPHAGOGASTRODUODENOSCOPY (EGD) WITH PROPOFOL N/A 08/02/2017   Procedure: ESOPHAGOGASTRODUODENOSCOPY (EGD) WITH PROPOFOL;  Surgeon: Rogene Houston, MD;  Location: [facility];  Service: Endoscopy;  Laterality: N/A;  8:55am  . HERNIA REPAIR    . HIP ARTHROSCOPY  2018   right hip  . LAPAROSCOPY N/A 08/26/2017   Procedure: DIAGNOSTIC LAPAROSCOPY;  Surgeon: Virl Cagey, MD;  Location: [facility] ORS;  Service: General;  Laterality: N/A;    Family History  Problem Relation Age of Onset  . Colon cancer Unknown        grandmother  . Colon cancer Paternal Grandmother     Social History   Tobacco Use  . Smoking status: Current Every Day Smoker    Packs/day: 1.00    Years: 7.00    Pack years: 7.00    Types: Cigarettes  . Smokeless tobacco: Never Used  Substance Use Topics  . Alcohol use: Yes    Alcohol/week: 0.0 oz    Comment: once or twice per week  . Drug use:  No   Reviewed medications.  Current Facility-Administered Medications  Medication Dose Route Frequency Provider Last Rate Last Dose  . diphenhydrAMINE (BENADRYL) 12.5 MG/5ML elixir 12.5 mg  12.5 mg Oral Q6H PRN Virl Cagey, MD       Or  . diphenhydrAMINE (BENADRYL) injection 12.5 mg  12.5 mg Intravenous Q6H PRN Virl Cagey, MD      . docusate sodium (COLACE) capsule 100 mg  100 mg Oral BID Virl Cagey, MD      . enoxaparin (LOVENOX) injection 40 mg  40 mg Subcutaneous Q24H Virl Cagey, MD      . HYDROmorphone (DILAUDID) injection 0.5 mg  0.5 mg Intravenous Q4H PRN Virl Cagey, MD      . lactated ringers infusion   Intravenous Continuous Virl Cagey, MD      . ondansetron (ZOFRAN-ODT) disintegrating tablet 4 mg  4 mg Oral Q6H PRN Virl Cagey, MD       Or  . ondansetron (ZOFRAN) injection 4 mg  4 mg Intravenous Q6H PRN Virl Cagey, MD      . oxyCODONE-acetaminophen (PERCOCET) 10-325 MG per tablet 1 tablet  1 tablet Oral Q6H PRN Virl Cagey, MD      . pantoprazole (PROTONIX) EC tablet 40 mg  40 mg Oral Daily Virl Cagey, MD      . simethicone [facility]) chewable tablet 40 mg  40 mg Oral Q6H PRN Virl Cagey, MD      . SUMAtriptan (IMITREX) tablet 25 mg  25 mg Oral Once Virl Cagey, MD      . SUMAtriptan (IMITREX) tablet 25 mg  25 mg Oral Q2H PRN Virl Cagey, MD       Current Outpatient Medications  Medication Sig Dispense Refill Last Dose  . omeprazole (PRILOSEC) 40 MG capsule Take 1 capsule (40 mg total) by mouth daily. 30 capsule 0 08/28/2017 at Unknown time  . ondansetron (ZOFRAN) 4 MG tablet Take 1 tablet (4 mg total) by mouth every 8 (eight) hours as needed for nausea or vomiting. 12 tablet 0 08/28/2017 at Unknown time  . oxyCODONE-acetaminophen (PERCOCET) 10-325 MG tablet Take 1 tablet by mouth every 6 (six) hours as needed for pain. 12 tablet 0 08/28/2017 at Unknown time  . tetrahydrozoline 0.05 % ophthalmic solution Place into both eyes daily as needed (dryness).   Past Week at Unknown time   No Known Allergies  ROS:  A comprehensive review of systems was negative except for: Gastrointestinal: positive for abdominal pain, constipation and nausea Musculoskeletal: positive for shoulder pain  Blood pressure (!) 111/93, pulse 67, temperature (!) 97.5 F (36.4 C), temperature source Oral, resp. rate 18, height _0  (1.753 m), weight 195 lb (88.5 kg), SpO2 100 %. Physical Exam  Constitutional: He is oriented to person, place, and time. He appears distressed.  HENT:  Head: Normocephalic.  Eyes: Pupils are equal, round, and reactive to light.  Neck: Normal range of motion.   Cardiovascular: Normal rate and regular rhythm.  Pulmonary/Chest: Effort normal.  Abdominal: Soft. He exhibits no distension. There is tenderness.  Port sites with dermabond, upper epigastric with some guarding, lower abdomen with no rebound or guarding, abdomen soft  Genitourinary: Rectum normal. Rectal exam shows no external hemorrhoid and no tenderness.  Musculoskeletal: Normal range of motion.  Neurological: He is alert and oriented to person, place, and time.  Skin: Skin is warm and dry.  Psychiatric: Mood, memory, affect and judgment  normal.  Vitals reviewed.   Results: Results for orders placed or performed during the hospital encounter of 08/28/17 (from the past 48 hour(s))  Comprehensive metabolic panel     Status: Abnormal   Collection Time: 08/28/17  1:02 PM  Result Value Ref Range   Sodium 138 135 - 145 mmol/L   Potassium 3.5 3.5 - 5.1 mmol/L   Chloride 100 (L) 101 - 111 mmol/L   CO2 27 22 - 32 mmol/L   Glucose, Bld 88 65 - 99 mg/dL   BUN 13 6 - 20 mg/dL   Creatinine, Ser 0.92 0.61 - 1.24 mg/dL   Calcium 9.5 8.9 - 10.3 mg/dL   Total Protein 7.9 6.5 - 8.1 g/dL   Albumin 4.5 3.5 - 5.0 g/dL   AST 20 15 - 41 U/L   ALT 15 (L) 17 - 63 U/L   Alkaline Phosphatase 54 38 - 126 U/L   Total Bilirubin 0.9 0.3 - 1.2 mg/dL   GFR calc non Af Amer >60 >60 mL/min   GFR calc Af Amer >60 >60 mL/min    Comment: (NOTE) The eGFR has been calculated using the CKD EPI equation. This calculation has not been validated in all clinical situations. eGFR's persistently <60 mL/min signify possible Chronic Kidney Disease.    Anion gap 11 5 - 15  Lipase, blood     Status: None   Collection Time: 08/28/17  1:02 PM  Result Value Ref Range   Lipase 24 11 - 51 U/L  CBC with Differential     Status: None   Collection Time: 08/28/17  1:02 PM  Result Value Ref Range   WBC 6.3 4.0 - 10.5 K/uL   RBC 5.11 4.22 - 5.81 MIL/uL   Hemoglobin 15.1 13.0 - 17.0 g/dL   HCT 44.8 39.0 - 52.0 %   MCV 87.7  78.0 - 100.0 fL   MCH 29.5 26.0 - 34.0 pg   MCHC 33.7 30.0 - 36.0 g/dL   RDW 12.6 11.5 - 15.5 %   Platelets 336 150 - 400 K/uL   Neutrophils Relative % 60 %   Neutro Abs 3.8 1.7 - 7.7 K/uL   Lymphocytes Relative 31 %   Lymphs Abs 1.9 0.7 - 4.0 K/uL   Monocytes Relative 9 %   Monocytes Absolute 0.5 0.1 - 1.0 K/uL   Eosinophils Relative 0 %   Eosinophils Absolute 0.0 0.0 - 0.7 K/uL   Basophils Relative 0 %   Basophils Absolute 0.0 0.0 - 0.1 K/uL  Lactic acid, plasma     Status: None   Collection Time: 08/28/17  1:04 PM  Result Value Ref Range   Lactic Acid, Venous 1.0 0.5 - 1.9 mmol/L  Lactic acid, plasma     Status: None   Collection Time: 08/28/17  3:38 PM  Result Value Ref Range   Lactic Acid, Venous 1.1 0.5 - 1.9 mmol/L   Personally reviewed imaging and reviewed with Dr. Gwyndolyn Kaufman reviewed and has air under diaphragm, nonspecific bowel gas pattern, air in the colon CT- no free fluid or extravasation   Ct Abdomen Pelvis W Contrast  Result Date: 08/28/2017 CLINICAL DATA:  Upper abdominal pain since laparoscopy EXAM: CT ABDOMEN AND PELVIS WITH CONTRAST TECHNIQUE: Multidetector CT imaging of the abdomen and pelvis was performed using the standard protocol following bolus administration of intravenous contrast. Sagittal and coronal MPR images reconstructed from axial data set. CONTRAST:  Dilute oral contrast.  100 cc Isovue-300 IV. COMPARISON:  08/08/2017 FINDINGS: Lower  chest: Lung bases clear Hepatobiliary: Gallbladder surgically absent. Liver normal appearance. No biliary dilatation. Pancreas: Normal appearance Spleen: Normal appearance Adrenals/Urinary Tract: Adrenal glands, kidneys, ureters, and bladder normal appearance Stomach/Bowel: Normal appendix. Oral contrast as passed through most of the small bowel with lead edge near the ileocecal valve. No bowel dilatation or bowel wall thickening. No evidence of ileus. Colon decompressed. Stomach unremarkable. Vascular/Lymphatic:  Vascular structures unremarkable. No adenopathy. Reproductive: Unremarkable prostate gland Other: Free air identified in the upper abdomen, lesser sac, and periportal. In the absence of evidence of contrast extravasation and free fluid, this most likely represents residual air from recent diagnostic laparoscopy. No hernia. Musculoskeletal: No acute osseous findings. IMPRESSION: Free air without evidence of free fluid or contrast extravasation, likely representing residual from recent diagnostic laparoscopy 2 days ago. Otherwise negative exam. Electronically Signed   By: Lavonia Dana M.D.   On: 08/28/2017 16:45   Dg Abd Acute W/chest  Result Date: 08/28/2017 CLINICAL DATA:  Two days postoperative from laparoscopic surgery performed in an effort to exclude to if evaluate the etiology for recurrent small-bowel obstructions. EXAM: DG ABDOMEN ACUTE W/ 1V CHEST COMPARISON:  Abdominal CT scan of August 08, 2017 FINDINGS: There is free subdiaphragmatic gas bilaterally presumably from the recent laparoscopic procedure. There remain loops of mildly distended gas-filled small bowel predominantly in the left upper quadrant with smaller air-fluid levels to the right of the lumbar spine. There is some gas within normal caliber colon and rectum. There surgical clips in the gallbladder fossa. The bony structures are unremarkable. IMPRESSION: Persistent small bowel obstruction. Free extraluminal gas collections that could be related to thelaparoscopic surgery 2 days ago. Correlation with the patient's current clinical exam and laboratory values will be needed in an effort to assure that these gas collections are merely postoperative and do not reflect a perforated viscus. These results were called by me by telephone at the time of interpretation on 08/28/2017 at 2:06 pm to Dr. Francine Graven , who verbally acknowledged these results. Electronically Signed   By: David  Martinique M.D.   On: 08/28/2017 14:06     Assessment &  Plan:  Axyl Sitzman is a 32 y.o. male with chronic abdominal pain and worsening pain since the diagnostic laparoscopy that demonstrated no intraabdominal pathology to explain his pain.  CT scan with no obstructive pattern, contrast moving through, and the free air is related to this diagnostic laparoscopy. We have discussed other etiologies to the abdominal pain including abdominal migraines, marijuana use, etc. Have also discussed need for colonoscopy with Dr. Laural Golden.  -PRN for pain, ordered also sumatriptan for possible abdominal migraine, will see if he gets any relief, I have done some research and some people have some symptomatic improvement with Triptan, as far as the criteria he technically meets the criteria   Table 1. Diagnostic Criteria of Abdominal Migraine (ICHD-III) A. At least 5 attacks of abdominal pain, fulfilling criteria B to D B. Pain has at least 2 of the following 3 characteristics:   1. Midline location, periumbilical or poorly localized 2. Dull or "just sore" quality 3. Moderate or severe intensity YES   C. During attacks, at least 2 of the following:   1. Anorexia YES 2. Nausea YES 3. Vomiting 4. Pallor  D. Attacks last 2 to 72 hours when untreated or unsuccessfully treated YES E. Complete freedom from symptoms between attacks YES  F. Not attributed to another disorder  (Headache Classification Committee of the International Headache Society 2018) Table 2. Diagnostic  Criteria of Abdominal Migraine ([place] III Pediatric Criteria) Must include all of the following: 1. Paroxysmal episode of intense, acute periumbilical pain that lasts 1 hour or more YES epigastric/ periumbilical  2. Intervening periods of usual health lasting weeks to months YES 3. The pain interferes with normal activities. YES 4. The pain is associated with 2 or more of the following:   A. Anorexia YES B. Nausea YES C. Vomiting D. Headache E. Photophobia F. Pallor  5. No evidence of an inflammatory,  anatomic, metabolic, or neoplastic process considered that explains the subject s symptoms- YES To date on evaluation    -He will still need a colonoscopy to review the remaining part of the intestine to verify nothing that is being missed  -Diet as tolerated -LR ordered  -SCDs, lovenox  -Encourage warm shower if the pain not improved with meds as he report this helps some, also can go along with abdominal pain associated with cannabinoids, urine drug screen ordered by ED and pending    All questions were answered to the satisfaction of the patient and family.     Virl Cagey 08/28/2017, 4:48 PM

## 2017-08-28 NOTE — ED Notes (Signed)
Pt states pain coming back and rating 8.mildy restless

## 2017-08-28 NOTE — ED Triage Notes (Signed)
Patient had abdominal surgery on Monday, reports he has not had a BM and feels like he has another blockage.

## 2017-08-28 NOTE — ED Notes (Signed)
Pt & pt's family member informed of need for urine sample. Pt cannot provide one at this time but was informed to let nursing staff know when able.

## 2017-08-28 NOTE — ED Notes (Signed)
Mother to desk. In with pt and he states something is wrong. Pt all over bed. edp aware.

## 2017-08-28 NOTE — ED Notes (Signed)
Pt taken to ct 

## 2017-08-28 NOTE — ED Notes (Signed)
Pt taken to xray 

## 2017-08-28 NOTE — ED Notes (Signed)
Pt mildy more comfortable. Stated he had a bad spell.

## 2017-08-28 NOTE — ED Provider Notes (Signed)
[facility] EMERGENCY DEPARTMENT Provider Note   CSN: [id number] Arrival date & time: 08/28/17  1122     History   Chief Complaint Chief Complaint  Patient presents with  . Post-op Problem    HPI [name] [name] is a 32 y.o. male.  HPI Pt was seen at 1300.  Per pt, c/o gradual onset and persistence of constant acute flair of his chronic generalized abd "pain" for the past 2 days.  Has been associated with nausea. Describes the abd pain as "stabbing," and per his chronic pain pattern for the past 3 years. Pt was evaluated by General Surgeon with reassuring dx lap 2 days ago. Denies vomiting/diarrhea, no fevers, no back pain, no rash, no CP/SOB, no black or blood in stools.      Past Medical History:  Diagnosis Date  . Enteritis   . History of Legg-Calve-Perthes disease   . Small bowel obstruction [facility])     Patient Active Problem List   Diagnosis Date Noted  . Diarrhea 08/15/2017  . Intestinal adhesions with obstruction ([place]) 08/14/2017  . Dehydration 07/20/2017  . Enteritis 07/20/2017  . Hypokalemia 07/20/2017  . Tobacco abuse 07/20/2017  . Diarrhea of presumed infectious origin   . Intussusception ([place])   . Colitis 07/17/2017  . Perforation of intestine ([place]) 07/17/2017  . Abnormal CT scan, small bowel   . Abdominal pain, chronic, epigastric   . Ileus ([place]) 02/09/2015  . Hypotension 02/06/2015  . Nausea vomiting and diarrhea 02/06/2015  . Partial small bowel obstruction ([place]) 10/27/2013  . Abdominal pain 10/27/2013    Past Surgical History:  Procedure Laterality Date  . ABDOMINAL SURGERY    . BIOPSY N/A 08/02/2017   Procedure: BIOPSY;  Surgeon: Rogene Houston, MD;  Location: [facility] ENDO SUITE;  Service: Endoscopy;  Laterality: N/A;  . CHOLECYSTECTOMY    . ENTEROSCOPY N/A 08/02/2017   Procedure: PUSH ENTEROSCOPY;  Surgeon: Rogene Houston, MD;  Location: [facility] ENDO SUITE;  Service: Endoscopy;  Laterality: N/A;  . ESOPHAGOGASTRODUODENOSCOPY (EGD) WITH PROPOFOL N/A  08/02/2017   Procedure: ESOPHAGOGASTRODUODENOSCOPY (EGD) WITH PROPOFOL;  Surgeon: Rogene Houston, MD;  Location: [facility] ENDO SUITE;  Service: Endoscopy;  Laterality: N/A;  8:55am  . HERNIA REPAIR    . HIP ARTHROSCOPY  2018   right hip  . LAPAROSCOPY N/A 08/26/2017   Procedure: DIAGNOSTIC LAPAROSCOPY;  Surgeon: Virl Cagey, MD;  Location: [facility] ORS;  Service: General;  Laterality: N/A;       Home Medications    Prior to Admission medications   Medication Sig Start Date End Date Taking? Authorizing Provider  omeprazole (PRILOSEC) 40 MG capsule Take 1 capsule (40 mg total) by mouth daily. 07/20/17  Yes Dhungel, Nishant, MD  ondansetron (ZOFRAN) 4 MG tablet Take 1 tablet (4 mg total) by mouth every 8 (eight) hours as needed for nausea or vomiting. 08/26/17 08/26/18 Yes Virl Cagey, MD  oxyCODONE-acetaminophen (PERCOCET) 10-325 MG tablet Take 1 tablet by mouth every 6 (six) hours as needed for pain. 08/26/17  Yes Virl Cagey, MD  tetrahydrozoline 0.05 % ophthalmic solution Place into both eyes daily as needed (dryness).   Yes [provider]    Family History Family History  Problem Relation Age of Onset  . Colon cancer Unknown        grandmother  . Colon cancer Paternal Grandmother     Social History Social History   Tobacco Use  . Smoking status: Current Every Day Smoker    Packs/day: 1.00  Years: 7.00    Pack years: 7.00    Types: Cigarettes  . Smokeless tobacco: Never Used  Substance Use Topics  . Alcohol use: Yes    Alcohol/week: 0.0 oz    Comment: once or twice per week  . Drug use: No     Allergies   Patient has no known allergies.   Review of Systems Review of Systems ROS: Statement: All systems negative except as marked or noted in the HPI; Constitutional: Negative for fever and chills. ; ; Eyes: Negative for eye pain, redness and discharge. ; ; ENMT: Negative for ear pain, hoarseness, nasal congestion, sinus pressure and sore throat. ;  ; Cardiovascular: Negative for chest pain, palpitations, diaphoresis, dyspnea and peripheral edema. ; ; Respiratory: Negative for cough, wheezing and stridor. ; ; Gastrointestinal: +nausea, abd pain. Negative for vomiting, diarrhea, blood in stool, hematemesis, jaundice and rectal bleeding. . ; ; Genitourinary: Negative for dysuria, flank pain and hematuria. ; ; Musculoskeletal: Negative for back pain and neck pain. Negative for swelling and trauma.; ; Skin: Negative for pruritus, rash, abrasions, blisters, bruising and skin lesion.; ; Neuro: Negative for headache, lightheadedness and neck stiffness. Negative for weakness, altered level of consciousness, altered mental status, extremity weakness, paresthesias, involuntary movement, seizure and syncope.       Physical Exam Updated Vital Signs BP 113/66   Pulse (!) 58   Temp (!) 97.5 F (36.4 C) (Oral)   Resp 18   Ht 5\' 9"  (1.753 m)   Wt 88.5 kg (195 lb)   SpO2 96%   BMI 28.80 kg/m    BP 117/74   Pulse 72   Temp (!) 97.5 F (36.4 C) (Oral)   Resp (!) 72   Ht 5\' 9"  (1.753 m)   Wt 88.5 kg (195 lb)   SpO2 98%   BMI 28.80 kg/m    Physical Exam 1305: Physical examination:  Nursing notes reviewed; Vital signs and O2 SAT reviewed;  Constitutional: Well developed, Well nourished, Well hydrated, Uncomfortable appearing.; Head:  Normocephalic, atraumatic; Eyes: EOMI, PERRL, No scleral icterus; ENMT: Mouth and pharynx normal, Mucous membranes moist; Neck: Supple, Full range of motion, No lymphadenopathy; Cardiovascular: Regular rate and rhythm, No gallop; Respiratory: Breath sounds clear & equal bilaterally, No wheezes.  Speaking full sentences with ease, Normal respiratory effort/excursion; Chest: Nontender, Movement normal; Abdomen: Soft, +diffuse tenderness to palp. Nondistended, Normal bowel sounds; Genitourinary: No CVA tenderness; Extremities: Pulses normal, No tenderness, No edema, No calf edema or asymmetry.; Neuro: AA&Ox3, Major CN  grossly intact.  Speech clear. No gross focal motor or sensory deficits in extremities.; Skin: Color normal, Warm, Dry.   ED Treatments / Results  Labs (all labs ordered are listed, but only abnormal results are displayed)   EKG  EKG Interpretation None       Radiology   Procedures Procedures (including critical care time)  Medications Ordered in ED Medications  ondansetron (ZOFRAN) injection 4 mg (4 mg Intravenous Given 08/28/17 1306)  fentaNYL (SUBLIMAZE) injection 50 mcg (50 mcg Intravenous Given 08/28/17 1306)     Initial Impression / Assessment and Plan / ED Course  I have reviewed the triage vital signs and the nursing notes.  Pertinent labs & imaging results that were available during my care of the patient were reviewed by me and considered in my medical decision making (see chart for details).  MDM Reviewed: previous chart, nursing note and vitals Reviewed previous: labs Interpretation: labs, CT scan and x-ray   Results for orders  placed or performed during the hospital encounter of 08/28/17  Comprehensive metabolic panel  Result Value Ref Range   Sodium 138 135 - 145 mmol/L   Potassium 3.5 3.5 - 5.1 mmol/L   Chloride 100 (L) 101 - 111 mmol/L   CO2 27 22 - 32 mmol/L   Glucose, Bld 88 65 - 99 mg/dL   BUN 13 6 - 20 mg/dL   Creatinine, Ser 0.92 0.61 - 1.24 mg/dL   Calcium 9.5 8.9 - 10.3 mg/dL   Total Protein 7.9 6.5 - 8.1 g/dL   Albumin 4.5 3.5 - 5.0 g/dL   AST 20 15 - 41 U/L   ALT 15 (L) 17 - 63 U/L   Alkaline Phosphatase 54 38 - 126 U/L   Total Bilirubin 0.9 0.3 - 1.2 mg/dL   GFR calc non Af Amer >60 >60 mL/min   GFR calc Af Amer >60 >60 mL/min   Anion gap 11 5 - 15  Lipase, blood  Result Value Ref Range   Lipase 24 11 - 51 U/L  Lactic acid, plasma  Result Value Ref Range   Lactic Acid, Venous 1.0 0.5 - 1.9 mmol/L  Lactic acid, plasma  Result Value Ref Range   Lactic Acid, Venous 1.1 0.5 - 1.9 mmol/L  CBC with Differential  Result Value Ref  Range   WBC 6.3 4.0 - 10.5 K/uL   RBC 5.11 4.22 - 5.81 MIL/uL   Hemoglobin 15.1 13.0 - 17.0 g/dL   HCT 44.8 39.0 - 52.0 %   MCV 87.7 78.0 - 100.0 fL   MCH 29.5 26.0 - 34.0 pg   MCHC 33.7 30.0 - 36.0 g/dL   RDW 12.6 11.5 - 15.5 %   Platelets 336 150 - 400 K/uL   Neutrophils Relative % 60 %   Neutro Abs 3.8 1.7 - 7.7 K/uL   Lymphocytes Relative 31 %   Lymphs Abs 1.9 0.7 - 4.0 K/uL   Monocytes Relative 9 %   Monocytes Absolute 0.5 0.1 - 1.0 K/uL   Eosinophils Relative 0 %   Eosinophils Absolute 0.0 0.0 - 0.7 K/uL   Basophils Relative 0 %   Basophils Absolute 0.0 0.0 - 0.1 K/uL    Ct Abdomen Pelvis W Contrast Result Date: 08/28/2017 CLINICAL DATA:  Upper abdominal pain since laparoscopy EXAM: CT ABDOMEN AND PELVIS WITH CONTRAST TECHNIQUE: Multidetector CT imaging of the abdomen and pelvis was performed using the standard protocol following bolus administration of intravenous contrast. Sagittal and coronal MPR images reconstructed from axial data set. CONTRAST:  Dilute oral contrast.  100 cc Isovue-300 IV. COMPARISON:  08/08/2017 FINDINGS: Lower chest: Lung bases clear Hepatobiliary: Gallbladder surgically absent. Liver normal appearance. No biliary dilatation. Pancreas: Normal appearance Spleen: Normal appearance Adrenals/Urinary Tract: Adrenal glands, kidneys, ureters, and bladder normal appearance Stomach/Bowel: Normal appendix. Oral contrast as passed through most of the small bowel with lead edge near the ileocecal valve. No bowel dilatation or bowel wall thickening. No evidence of ileus. Colon decompressed. Stomach unremarkable. Vascular/Lymphatic: Vascular structures unremarkable. No adenopathy. Reproductive: Unremarkable prostate gland Other: Free air identified in the upper abdomen, lesser sac, and periportal. In the absence of evidence of contrast extravasation and free fluid, this most likely represents residual air from recent diagnostic laparoscopy. No hernia. Musculoskeletal: No acute  osseous findings. IMPRESSION: Free air without evidence of free fluid or contrast extravasation, likely representing residual from recent diagnostic laparoscopy 2 days ago. Otherwise negative exam. Electronically Signed   By: Lavonia Dana M.D.   On:  08/28/2017 16:45   Dg Abd Acute W/chest Result Date: 08/28/2017 CLINICAL DATA:  Two days postoperative from laparoscopic surgery performed in an effort to exclude to if evaluate the etiology for recurrent small-bowel obstructions. EXAM: DG ABDOMEN ACUTE W/ 1V CHEST COMPARISON:  Abdominal CT scan of August 08, 2017 FINDINGS: There is free subdiaphragmatic gas bilaterally presumably from the recent laparoscopic procedure. There remain loops of mildly distended gas-filled small bowel predominantly in the left upper quadrant with smaller air-fluid levels to the right of the lumbar spine. There is some gas within normal caliber colon and rectum. There surgical clips in the gallbladder fossa. The bony structures are unremarkable. IMPRESSION: Persistent small bowel obstruction. Free extraluminal gas collections that could be related to thelaparoscopic surgery 2 days ago. Correlation with the patient's current clinical exam and laboratory values will be needed in an effort to assure that these gas collections are merely postoperative and do not reflect a perforated viscus. These results were called by me by telephone at the time of interpretation on 08/28/2017 at 2:06 pm to Dr. Francine Graven , who verbally acknowledged these results. Electronically Signed   By: David  Martinique M.D.   On: 08/28/2017 14:06    1640:  Workup reassuring. Pt continues to c/o significant abd pain despite multiple doses of IV pain meds; will admit. T/C to General Surgery Dr. Constance Haw, case discussed, including:  HPI, pertinent PM/SHx, VS/PE, dx testing, ED course and treatment:  Agreeable to admit.     Final Clinical Impressions(s) / ED Diagnoses   Final diagnoses:  None    ED Discharge  Orders    None       Francine Graven, DO 08/30/17 2116

## 2017-08-28 NOTE — ED Notes (Signed)
Pt returned from xray. Drinking contrast. Aware ct scan will be around 345pm

## 2017-08-28 NOTE — Progress Notes (Signed)
[facility]  Saw patient. Having abdominal pain like his prior pain, and having some shoulder pain. Reviewed Xray with Dr. Thornton Papas, radiology, and note the free air but likely related the diagnostic laparoscopy.    Patient with pain but abdomen soft. No rebound or guarding in the lower abdomen. Does guard some in the upper abdomen.  Moves around in bed ok, rectal exam within normal limits.   Taking in the contrast now.   Will get CT and follow up.  Curlene Labrum, MD [facility] [address] [place],  [id number] [phone] (office)

## 2017-08-28 NOTE — ED Notes (Signed)
Pt restful at this time. Aware needing urine sample

## 2017-08-28 NOTE — ED Notes (Signed)
Pt restless and grimacing. edp aware.

## 2017-08-29 ENCOUNTER — Other Ambulatory Visit: Payer: Self-pay

## 2017-08-29 ENCOUNTER — Encounter (HOSPITAL_COMMUNITY): Payer: Self-pay | Admitting: Internal Medicine

## 2017-08-29 DIAGNOSIS — K529 Noninfective gastroenteritis and colitis, unspecified: Secondary | ICD-10-CM | POA: Diagnosis not present

## 2017-08-29 DIAGNOSIS — G8929 Other chronic pain: Secondary | ICD-10-CM | POA: Diagnosis not present

## 2017-08-29 DIAGNOSIS — R197 Diarrhea, unspecified: Secondary | ICD-10-CM | POA: Diagnosis not present

## 2017-08-29 DIAGNOSIS — K644 Residual hemorrhoidal skin tags: Secondary | ICD-10-CM | POA: Diagnosis not present

## 2017-08-29 DIAGNOSIS — R1084 Generalized abdominal pain: Secondary | ICD-10-CM | POA: Diagnosis not present

## 2017-08-29 DIAGNOSIS — F17210 Nicotine dependence, cigarettes, uncomplicated: Secondary | ICD-10-CM | POA: Diagnosis not present

## 2017-08-29 DIAGNOSIS — D125 Benign neoplasm of sigmoid colon: Secondary | ICD-10-CM | POA: Diagnosis not present

## 2017-08-29 DIAGNOSIS — Z23 Encounter for immunization: Secondary | ICD-10-CM | POA: Diagnosis not present

## 2017-08-29 DIAGNOSIS — K921 Melena: Secondary | ICD-10-CM | POA: Diagnosis not present

## 2017-08-29 DIAGNOSIS — R110 Nausea: Secondary | ICD-10-CM | POA: Diagnosis not present

## 2017-08-29 MED ORDER — PROPRANOLOL HCL 20 MG PO TABS
10.0000 mg | ORAL_TABLET | Freq: Three times a day (TID) | ORAL | Status: DC
  Administered 2017-08-29 (×2): 10 mg via ORAL
  Filled 2017-08-29 (×2): qty 1

## 2017-08-29 MED ORDER — OXYCODONE HCL 5 MG PO TABS
10.0000 mg | ORAL_TABLET | Freq: Once | ORAL | Status: AC
  Administered 2017-08-29: 10 mg via ORAL
  Filled 2017-08-29: qty 2

## 2017-08-29 MED ORDER — OXYCODONE HCL 5 MG PO TABS
10.0000 mg | ORAL_TABLET | ORAL | Status: DC | PRN
  Administered 2017-08-29 (×2): 10 mg via ORAL
  Filled 2017-08-29 (×2): qty 2

## 2017-08-29 NOTE — Progress Notes (Signed)
[facility]  Taking in some jello. Daughters in the room, and Jaiyden looks more comfortable. Still with pain but not writhing.  Colonoscopy tomorrow. Clears now per Dr. Laural Golden, and propranolol ordered.  More GI labs ordered.  Likely home tomorrow after colonoscopy.  Roxi 10 mg q4 from q6 (changed from the percocet 5/325 tab+ Roxi 5 that pharmacy had ordered to substitute for his home percocet 10/325, since RN unable to pull this from [place])  Curlene Labrum, [place] [address] [place], [place] [address] [phone] (office)

## 2017-08-29 NOTE — Progress Notes (Signed)
RN spoke with Dr. Laural Golden who stated he wanted to see pt in the am and examine his abdomen before he starts him on [place]. So tonight, no orders for colonoscopy. RN educated pt on this. Pt verbalized understanding.

## 2017-08-29 NOTE — Consult Note (Signed)
Referring Provider: Blake Divine, MD Primary Care Physician:  Redmond School, MD Primary Gastroenterologist:  Dr. Laural Golden  Reason for Consultation:   Abdominal pain nausea vomiting diarrhea and rectal bleeding.  HPI:   Patient is a 32 year old Caucasian male who has a history of abdominal pain nausea vomiting and diarrhea which started back in early part of 2015.  He was hospitalized in March 2015 and felt to have enterocolitis versus partial small bowel.  He responded to conservative therapy.  He was retreated for more or less similar symptomatology in June 2016 and responded to medical therapy. He did not have any major symptoms until November 2018 when he noted pain across his upper abdomen with nausea and vomiting.  He was admitted to [facility] and noted to have duodenal and jejunal wall thickening CT performed on 07/17/2017. There was a question of pneumatosis he was felt to have acute enteritis.  Follow-up CT 2 days later showed resolution of duodenal and jejunal wall thickening and incidental finding of a small jejunal intussusception.  He was treated with antibiotics however his his pain never resolved.  Celiac antibody panel and IgG4 level were normal.   He therefore underwent EGD with push enteroscopy on 08/02/2017 and no abnormality noted other than prominent jejunal folds. Jejunal mucosal biopsies revealed no inflammatory changes. Similarly CT enterography of 08/08/2017 revealed no abnormality. Felt patient may have adhesive disease and would benefit from diagnostic laparoscopy. Patient underwent laparoscopy on 08/26/2017 by Dr. Blake Divine and no abnormality was identified. Patient returned to the emergency room yesterday with unrelenting supraumbilical/epigastric pain with nausea.  Abdominopelvic CT reveals pneumoperitoneum felt to be due to recent laparoscopy.  There was no extravasation of contrast and small bowel appear to be normal. Patient was hospitalized for pain control.  He is  on oxycodone which does not help and he is on Dilaudid as needed.  He gets relief with Dilaudid for about an hour.  He complains of constant pain in the supraumbilical region.  He has been nauseated.  Since his symptoms began back in November 2018 he has had few episodes of emesis including one couple of days ago.  There is no history of hematemesis.  He has lost 13 pounds in the last 3-1/2 weeks.  He also complains of diarrhea.  He states he has had diarrhea for a few years.  He has anywhere from 7-10 stools per day.  This morning he noted bright red blood in the commode when he had a bowel movement.  He states he has had few episodes in the last 3 months.  He describes the amount to be small to moderate. Review of the systems is negative for headache skin rash or joint pains.  He does not take OTC NSAIDs. Urine drug screen yesterday was positive for cocaine and benzodiazepines and Tetrahydro-cannabinol. Patient smokes cigarettes but he does not drink alcohol. Patient had right hip arthroscopy in June 2018.  He was on pain medication for several months prior to that but had stopped taking pain medication in July 2018. He works for a [employer] and does his work from home.  His work is Tree surgeon.   Past Medical History:  Diagnosis Date  . Enteritis; hospitalized about 5 weeks ago.  Details as above.   Marland Kitchen History of Legg-Calve-Perthes disease   .  Hospitalized in March 2015 and June 2016 for ileus versus partial small bowel obstruction.     Past Surgical History:  Procedure Laterality Date  . ABDOMINAL SURGERY    .  BIOPSY N/A 08/02/2017   Procedure: BIOPSY;  Surgeon: Rogene Houston, MD;  Location: [facility];  Service: Endoscopy;  Laterality: N/A;  . CHOLECYSTECTOMY    . ENTEROSCOPY N/A 08/02/2017   Procedure: PUSH ENTEROSCOPY;  Surgeon: Rogene Houston, MD;  Location: [facility];  Service: Endoscopy;  Laterality: N/A;  . ESOPHAGOGASTRODUODENOSCOPY (EGD) WITH PROPOFOL N/A  08/02/2017   Procedure: ESOPHAGOGASTRODUODENOSCOPY (EGD) WITH PROPOFOL;  Surgeon: Rogene Houston, MD;  Location: [facility];  Service: Endoscopy;  Laterality: N/A;  8:55am  . HERNIA REPAIR    . HIP ARTHROSCOPY  2018   right hip  . LAPAROSCOPY N/A 08/26/2017   Procedure: DIAGNOSTIC LAPAROSCOPY;  Surgeon: Virl Cagey, MD;  Location: [facility];  Service: General;  Laterality: N/A;    Prior to Admission medications   Medication Sig Start Date End Date Taking? Authorizing Provider  omeprazole (PRILOSEC) 40 MG capsule Take 1 capsule (40 mg total) by mouth daily. 07/20/17  Yes Dhungel, Nishant, MD  ondansetron (ZOFRAN) 4 MG tablet Take 1 tablet (4 mg total) by mouth every 8 (eight) hours as needed for nausea or vomiting. 08/26/17 08/26/18 Yes Virl Cagey, MD  oxyCODONE-acetaminophen (PERCOCET) 10-325 MG tablet Take 1 tablet by mouth every 6 (six) hours as needed for pain. 08/26/17  Yes Virl Cagey, MD  tetrahydrozoline 0.05 % ophthalmic solution Place into both eyes daily as needed (dryness).   Yes [provider]    Current Facility-Administered Medications  Medication Dose Route Frequency Provider Last Rate Last Dose  . diphenhydrAMINE (BENADRYL) 12.5 MG/5ML elixir 12.5 mg  12.5 mg Oral Q6H PRN Virl Cagey, MD       Or  . diphenhydrAMINE (BENADRYL) injection 12.5 mg  12.5 mg Intravenous Q6H PRN Virl Cagey, MD      . docusate sodium (COLACE) capsule 100 mg  100 mg Oral BID Virl Cagey, MD   100 mg at 08/29/17 0842  . enoxaparin (LOVENOX) injection 40 mg  40 mg Subcutaneous Q24H Curlene Labrum C, MD      . HYDROmorphone (DILAUDID) injection 0.5 mg  0.5 mg Intravenous Q4H PRN Virl Cagey, MD   0.5 mg at 08/29/17 1534  . lactated ringers infusion   Intravenous Continuous Virl Cagey, MD 75 mL/hr at 08/29/17 0720    . ondansetron (ZOFRAN-ODT) disintegrating tablet 4 mg  4 mg Oral Q6H PRN Virl Cagey, MD       Or  . ondansetron  [facility]) injection 4 mg  4 mg Intravenous Q6H PRN Virl Cagey, MD   4 mg at 08/28/17 1717  . oxyCODONE-acetaminophen (PERCOCET/ROXICET) 5-325 MG per tablet 1 tablet  1 tablet Oral Q6H PRN Virl Cagey, MD   1 tablet at 08/29/17 8756   And  . oxyCODONE (Oxy IR/ROXICODONE) immediate release tablet 5 mg  5 mg Oral Q6H PRN Virl Cagey, MD      . pantoprazole (PROTONIX) EC tablet 40 mg  40 mg Oral Daily Virl Cagey, MD   40 mg at 08/29/17 0842  . simethicone (MYLICON) chewable tablet 40 mg  40 mg Oral Q6H PRN Virl Cagey, MD      . SUMAtriptan (IMITREX) tablet 25 mg  25 mg Oral Q2H PRN Virl Cagey, MD        Allergies as of 08/28/2017  . (No Known Allergies)    Family History  Problem Relation Age of Onset  . Colon cancer Unknown  grandmother  . Colon cancer Paternal Grandmother     Social History   Socioeconomic History  . Marital status: Married    Spouse name: Not on file  . Number of children: Not on file  . Years of education: Not on file  . Highest education level: Not on file  Social Needs  . Financial resource strain: Not on file  . Food insecurity - worry: Not on file  . Food insecurity - inability: Not on file  . Transportation needs - medical: Not on file  . Transportation needs - non-medical: Not on file  Occupational History  . Not on file  Tobacco Use  . Smoking status: Current Every Day Smoker    Packs/day: 1.00    Years: 7.00    Pack years: 7.00    Types: Cigarettes  . Smokeless tobacco: Never Used  Substance and Sexual Activity  . Alcohol use: Yes    Alcohol/week: 0.0 oz    Comment: once or twice per week  . Drug use: No  . Sexual activity: Not on file  Other Topics Concern  . Not on file  Social History Narrative  . Not on file    Review of Systems: See HPI, otherwise normal ROS  Physical Exam: Temp:  [98 F (36.7 C)-98.7 F (37.1 C)] 98.1 F (36.7 C) (01/17 0609) Pulse Rate:  [55-72] 60 (01/17  1030) Resp:  [18-72] 18 (01/17 1030) BP: (104-120)/(58-77) 110/58 (01/17 1030) SpO2:  [95 %-100 %] 96 % (01/17 1030) Weight:  [186 lb 9.6 oz (84.6 kg)] 186 lb 9.6 oz (84.6 kg) (01/16 1807) Last BM Date: 08/29/17  Patient is alert.  He is tossing and turning in bed and appears to be in pain. Conjunctiva is pink.  Sclerae nonicteric. Oropharyngeal mucosa is normal. No neck masses or thyromegaly noted. Cardiac exam with regular rhythm normal S1 and S2.  No murmur or gallop noted. Auscultation of lungs revealed was a clear breath sounds bilaterally. Abdomen is full.  It is symmetrical.  Laparoscopy wounds noted.  Bowel sounds are normal.  Abdomen is soft without tenderness in left lower quadrant and left upper quadrant.  He has moderate tenderness in right upper quadrant and epigastric region with guarding but no rebound.  He also has tenderness without guarding in right lower quadrant.  No organomegaly or masses. No LE edema clubbing noted.   Recent Labs    08/28/17 1302  WBC 6.3  HGB 15.1  HCT 44.8  PLT 336   BMET Recent Labs    08/28/17 1302  NA 138  K 3.5  CL 100*  CO2 27  GLUCOSE 88  BUN 13  CREATININE 0.92  CALCIUM 9.5   LFT Recent Labs    08/28/17 1302  PROT 7.9  ALBUMIN 4.5  AST 20  ALT 15*  ALKPHOS 54  BILITOT 0.9    Studies/Results: Ct Abdomen Pelvis W Contrast  Result Date: 08/28/2017 CLINICAL DATA:  Upper abdominal pain since laparoscopy EXAM: CT ABDOMEN AND PELVIS WITH CONTRAST TECHNIQUE: Multidetector CT imaging of the abdomen and pelvis was performed using the standard protocol following bolus administration of intravenous contrast. Sagittal and coronal MPR images reconstructed from axial data set. CONTRAST:  Dilute oral contrast.  100 cc Isovue-300 IV. COMPARISON:  08/08/2017 FINDINGS: Lower chest: Lung bases clear Hepatobiliary: Gallbladder surgically absent. Liver normal appearance. No biliary dilatation. Pancreas: Normal appearance Spleen: Normal  appearance Adrenals/Urinary Tract: Adrenal glands, kidneys, ureters, and bladder normal appearance Stomach/Bowel: Normal appendix. Oral contrast as  passed through most of the small bowel with lead edge near the ileocecal valve. No bowel dilatation or bowel wall thickening. No evidence of ileus. Colon decompressed. Stomach unremarkable. Vascular/Lymphatic: Vascular structures unremarkable. No adenopathy. Reproductive: Unremarkable prostate gland Other: Free air identified in the upper abdomen, lesser sac, and periportal. In the absence of evidence of contrast extravasation and free fluid, this most likely represents residual air from recent diagnostic laparoscopy. No hernia. Musculoskeletal: No acute osseous findings. IMPRESSION: Free air without evidence of free fluid or contrast extravasation, likely representing residual from recent diagnostic laparoscopy 2 days ago. Otherwise negative exam. Electronically Signed   By: Lavonia Dana M.D.   On: 08/28/2017 16:45   Dg Abd Acute W/chest  Result Date: 08/28/2017 CLINICAL DATA:  Two days postoperative from laparoscopic surgery performed in an effort to exclude to if evaluate the etiology for recurrent small-bowel obstructions. EXAM: DG ABDOMEN ACUTE W/ 1V CHEST COMPARISON:  Abdominal CT scan of August 08, 2017 FINDINGS: There is free subdiaphragmatic gas bilaterally presumably from the recent laparoscopic procedure. There remain loops of mildly distended gas-filled small bowel predominantly in the left upper quadrant with smaller air-fluid levels to the right of the lumbar spine. There is some gas within normal caliber colon and rectum. There surgical clips in the gallbladder fossa. The bony structures are unremarkable. IMPRESSION: Persistent small bowel obstruction. Free extraluminal gas collections that could be related to thelaparoscopic surgery 2 days ago. Correlation with the patient's current clinical exam and laboratory values will be needed in an effort to  assure that these gas collections are merely postoperative and do not reflect a perforated viscus. These results were called by me by telephone at the time of interpretation on 08/28/2017 at 2:06 pm to Dr. Francine Graven , who verbally acknowledged these results. Electronically Signed   By: David  Martinique M.D.   On: 08/28/2017 14:06  Abdominopelvic CT from yesterday reviewed along with recent enterography and 2 abdominal pelvic CTs from December 2018.  With Dr.Greene  Assessment;  Patient is 32 year old Caucasian male who has unexplained unrelenting pain which started in November 2018 associated with nausea sporadic vomiting and he has undergone extensive workup but no etiology determined to account for his symptoms.  He had more or less similar episode in March 2015 and again in June 2016 and did well until fall of last year.  Patient was hospitalized in December and felt to have enteritis and and treated with antibiotics but his pain never went away.  Since then he has undergone push enteroscopy with jejunal biopsies revealing no abnormality, normal CT enterography and finally normal diagnostic laparoscopy earlier this week. Along the way screening for celiac disease was negative and IgG 4 was normal. He has what appears to be chronic diarrhea presumed to be due to IBS and now he is having rectal bleeding.  While he may have low-grade inflammatory bowel disease but I doubt that this diagnosis would explain his unrelenting pain.  One would expect to see colonic wall thickening given severity of his symptoms.  Given his symptoms evaluation of lower GI tract is warranted.  I do not believe he is ready to drink prep this afternoon. Need to rule out acute intermittent porphyria as well as eosinophilic disorder and finally we may be dealing with abdominal migraine. Will start him on low-dose propranolol.  If this medication is not well-tolerated he will be switched to TCA.  Recommendations;  Hold enoxaparin  for possible colonoscopy in a.m. Serum IgE. Random urinary  porphobilinogen. CBC with differential and metabolic 7 with a.m. Lab. Propranolol 10 mg p.o. 3 times daily.    LOS: 0 days   Rykar Lebleu  08/29/2017, 4:42 PM

## 2017-08-29 NOTE — Progress Notes (Signed)
[facility] Progress Note     Subjective: No major changes. Still having pain. Had 2 BMs that were liquid and dark, maybe a little red per his report. Feels sharp stabbing constant pain in the supraumbilical region. No nausea but anorexia.   Objective: Vital signs in last 24 hours: Temp:  [97.5 F (36.4 C)-98.7 F (37.1 C)] 98.1 F (36.7 C) (01/17 0609) Pulse Rate:  [55-80] 55 (01/17 0609) Resp:  [18-72] 18 (01/17 0609) BP: (104-131)/(64-99) 104/67 (01/17 0609) SpO2:  [95 %-100 %] 98 % (01/17 0609) Weight:  [186 lb 9.6 oz (84.6 kg)-195 lb (88.5 kg)] 186 lb 9.6 oz (84.6 kg) (01/16 1807) Last BM Date: 08/26/17  Intake/Output from previous day: 01/16 0701 - 01/17 0700 In: 41.3 [I.V.:41.3] Out: -  Intake/Output this shift: No intake/output data recorded.  General appearance: alert, cooperative and mild distress Resp: normal work breathing GI: soft, minimally distended upper quadrants, tender at the supraumbilical/epigastric region, no rebound or guarding, soft Extremities: extremities normal, atraumatic, no cyanosis or edema  Lab Results:  Recent Labs    08/28/17 1302  WBC 6.3  HGB 15.1  HCT 44.8  PLT 336   BMET Recent Labs    08/28/17 1302  NA 138  K 3.5  CL 100*  CO2 27  GLUCOSE 88  BUN 13  CREATININE 0.92  CALCIUM 9.5   PT/INR No results for input(s): LABPROT, INR in the last 72 hours.  Studies/Results: Ct Abdomen Pelvis W Contrast  Result Date: 08/28/2017 CLINICAL DATA:  Upper abdominal pain since laparoscopy EXAM: CT ABDOMEN AND PELVIS WITH CONTRAST TECHNIQUE: Multidetector CT imaging of the abdomen and pelvis was performed using the standard protocol following bolus administration of intravenous contrast. Sagittal and coronal MPR images reconstructed from axial data set. CONTRAST:  Dilute oral contrast.  100 cc Isovue-300 IV. COMPARISON:  08/08/2017 FINDINGS: Lower chest: Lung bases clear Hepatobiliary: Gallbladder surgically absent.  Liver normal appearance. No biliary dilatation. Pancreas: Normal appearance Spleen: Normal appearance Adrenals/Urinary Tract: Adrenal glands, kidneys, ureters, and bladder normal appearance Stomach/Bowel: Normal appendix. Oral contrast as passed through most of the small bowel with lead edge near the ileocecal valve. No bowel dilatation or bowel wall thickening. No evidence of ileus. Colon decompressed. Stomach unremarkable. Vascular/Lymphatic: Vascular structures unremarkable. No adenopathy. Reproductive: Unremarkable prostate gland Other: Free air identified in the upper abdomen, lesser sac, and periportal. In the absence of evidence of contrast extravasation and free fluid, this most likely represents residual air from recent diagnostic laparoscopy. No hernia. Musculoskeletal: No acute osseous findings. IMPRESSION: Free air without evidence of free fluid or contrast extravasation, likely representing residual from recent diagnostic laparoscopy 2 days ago. Otherwise negative exam. Electronically Signed   By: Lavonia Dana M.D.   On: 08/28/2017 16:45   Dg Abd Acute W/chest  Result Date: 08/28/2017 CLINICAL DATA:  Two days postoperative from laparoscopic surgery performed in an effort to exclude to if evaluate the etiology for recurrent small-bowel obstructions. EXAM: DG ABDOMEN ACUTE W/ 1V CHEST COMPARISON:  Abdominal CT scan of August 08, 2017 FINDINGS: There is free subdiaphragmatic gas bilaterally presumably from the recent laparoscopic procedure. There remain loops of mildly distended gas-filled small bowel predominantly in the left upper quadrant with smaller air-fluid levels to the right of the lumbar spine. There is some gas within normal caliber colon and rectum. There surgical clips in the gallbladder fossa. The bony structures are unremarkable. IMPRESSION: Persistent small bowel obstruction. Free extraluminal gas collections that could be related to thelaparoscopic  surgery 2 days ago. Correlation with  the patient's current clinical exam and laboratory values will be needed in an effort to assure that these gas collections are merely postoperative and do not reflect a perforated viscus. These results were called by me by telephone at the time of interpretation on 08/28/2017 at 2:06 pm to Dr. Francine Graven , who verbally acknowledged these results. Electronically Signed   By: David  Martinique M.D.   On: 08/28/2017 14:06    Assessment/Plan: [name] [name] is a 32 yo with chronic abdominal pain who underwent a diagnostic laparoscopy that revealed no etiology for the pain, and continues to have severe stabbing pain post operatively. He was brought in for observation. Had 2 BMs overnight, anorexic, and says he has last 14 lbs in the last few weeks.  PRN for pain Diet as tolerated, having Bms, no surgical intervention needed at this time  Dr. Laural Golden to see today Urinalysis positive for [facility], but told me he takes CBD oil, so not sure if this would result in positive [place] or not, denied any marijuana use, discussed stopping the use of CBD yesterday given some recent reports of pain with marijuana  Probably will ultimately need referral to a GI chronic abdominal pain specialist  Possibly home today versus in the AM    LOS: 0 days    Virl Cagey 08/29/2017

## 2017-08-30 ENCOUNTER — Encounter (HOSPITAL_COMMUNITY): Payer: Self-pay | Admitting: *Deleted

## 2017-08-30 ENCOUNTER — Observation Stay (HOSPITAL_COMMUNITY): Payer: 59 | Admitting: Anesthesiology

## 2017-08-30 ENCOUNTER — Encounter (HOSPITAL_COMMUNITY)
Admission: EM | Disposition: A | Discharge status: Home / Self Care | Payer: Self-pay | Source: Home / Self Care | Attending: Emergency Medicine

## 2017-08-30 DIAGNOSIS — R110 Nausea: Secondary | ICD-10-CM | POA: Diagnosis not present

## 2017-08-30 DIAGNOSIS — K625 Hemorrhage of anus and rectum: Secondary | ICD-10-CM | POA: Diagnosis not present

## 2017-08-30 DIAGNOSIS — F17210 Nicotine dependence, cigarettes, uncomplicated: Secondary | ICD-10-CM | POA: Diagnosis not present

## 2017-08-30 DIAGNOSIS — K921 Melena: Secondary | ICD-10-CM | POA: Diagnosis not present

## 2017-08-30 DIAGNOSIS — Z23 Encounter for immunization: Secondary | ICD-10-CM | POA: Diagnosis not present

## 2017-08-30 DIAGNOSIS — K529 Noninfective gastroenteritis and colitis, unspecified: Secondary | ICD-10-CM | POA: Diagnosis not present

## 2017-08-30 DIAGNOSIS — K635 Polyp of colon: Secondary | ICD-10-CM | POA: Diagnosis not present

## 2017-08-30 DIAGNOSIS — K644 Residual hemorrhoidal skin tags: Secondary | ICD-10-CM | POA: Diagnosis not present

## 2017-08-30 DIAGNOSIS — G8929 Other chronic pain: Secondary | ICD-10-CM | POA: Diagnosis not present

## 2017-08-30 DIAGNOSIS — R1084 Generalized abdominal pain: Secondary | ICD-10-CM | POA: Diagnosis not present

## 2017-08-30 DIAGNOSIS — R197 Diarrhea, unspecified: Secondary | ICD-10-CM | POA: Diagnosis not present

## 2017-08-30 DIAGNOSIS — D125 Benign neoplasm of sigmoid colon: Secondary | ICD-10-CM | POA: Diagnosis not present

## 2017-08-30 HISTORY — PX: POLYPECTOMY: SHX5525

## 2017-08-30 HISTORY — PX: COLONOSCOPY WITH PROPOFOL: SHX5780

## 2017-08-30 HISTORY — PX: BIOPSY: SHX5522

## 2017-08-30 LAB — CBC WITH DIFFERENTIAL/PLATELET
Basophils Absolute: 0 10*3/uL (ref 0.0–0.1)
Basophils Relative: 0 %
Eosinophils Absolute: 0.1 10*3/uL (ref 0.0–0.7)
Eosinophils Relative: 2 %
HEMATOCRIT: 41.5 % (ref 39.0–52.0)
Hemoglobin: 13.9 g/dL (ref 13.0–17.0)
LYMPHS PCT: 32 %
Lymphs Abs: 1.6 10*3/uL (ref 0.7–4.0)
MCH: 29 pg (ref 26.0–34.0)
MCHC: 33.5 g/dL (ref 30.0–36.0)
MCV: 86.6 fL (ref 78.0–100.0)
MONO ABS: 0.6 10*3/uL (ref 0.1–1.0)
MONOS PCT: 11 %
NEUTROS ABS: 2.8 10*3/uL (ref 1.7–7.7)
Neutrophils Relative %: 55 %
Platelets: 317 10*3/uL (ref 150–400)
RBC: 4.79 MIL/uL (ref 4.22–5.81)
RDW: 12.2 % (ref 11.5–15.5)
WBC: 5.1 10*3/uL (ref 4.0–10.5)

## 2017-08-30 LAB — BASIC METABOLIC PANEL
Anion gap: 8 (ref 5–15)
BUN: 11 mg/dL (ref 6–20)
CALCIUM: 9.1 mg/dL (ref 8.9–10.3)
CO2: 26 mmol/L (ref 22–32)
CREATININE: 0.84 mg/dL (ref 0.61–1.24)
Chloride: 108 mmol/L (ref 101–111)
GFR calc Af Amer: >60 mL/min (ref >60)
GFR calc non Af Amer: >60 mL/min (ref >60)
GLUCOSE: 83 mg/dL (ref 65–99)
Potassium: 3.7 mmol/L (ref 3.5–5.1)
Sodium: 142 mmol/L (ref 135–145)

## 2017-08-30 SURGERY — COLONOSCOPY WITH PROPOFOL
Anesthesia: Monitor Anesthesia Care

## 2017-08-30 MED ORDER — PROPOFOL 10 MG/ML IV BOLUS
INTRAVENOUS | Status: AC
  Filled 2017-08-30: qty 40

## 2017-08-30 MED ORDER — HYDROMORPHONE HCL 1 MG/ML IJ SOLN
0.2500 mg | INTRAMUSCULAR | Status: DC | PRN
  Administered 2017-08-30 (×3): 0.5 mg via INTRAVENOUS
  Filled 2017-08-30: qty 1

## 2017-08-30 MED ORDER — DICYCLOMINE HCL 10 MG PO CAPS: 10.0000 mg | ORAL_CAPSULE | Freq: Three times a day (TID) | ORAL | Status: DC

## 2017-08-30 MED ORDER — LACTATED RINGERS IV SOLN
INTRAVENOUS | Status: DC
  Administered 2017-08-30: 13:00:00 via INTRAVENOUS

## 2017-08-30 MED ORDER — DICYCLOMINE HCL 10 MG PO CAPS: 10.0000 mg | ORAL_CAPSULE | Freq: Three times a day (TID) | ORAL | 0 refills | Status: DC

## 2017-08-30 MED ORDER — LIDOCAINE HCL (PF) 1 % IJ SOLN
INTRAMUSCULAR | Status: AC
  Filled 2017-08-30: qty 5

## 2017-08-30 MED ORDER — ONDANSETRON HCL 4 MG/2ML IJ SOLN
INTRAMUSCULAR | Status: AC
  Filled 2017-08-30: qty 2

## 2017-08-30 MED ORDER — ONDANSETRON HCL 4 MG/2ML IJ SOLN
4.0000 mg | Freq: Once | INTRAMUSCULAR | Status: AC
  Administered 2017-08-30: 4 mg via INTRAVENOUS

## 2017-08-30 MED ORDER — PROPOFOL 10 MG/ML IV BOLUS
INTRAVENOUS | Status: AC
  Filled 2017-08-30: qty 20

## 2017-08-30 MED ORDER — MIDAZOLAM HCL 5 MG/5ML IJ SOLN
INTRAMUSCULAR | Status: DC | PRN
  Administered 2017-08-30 (×2): 1 mg via INTRAVENOUS

## 2017-08-30 MED ORDER — PROPRANOLOL HCL 10 MG PO TABS: 10.0000 mg | ORAL_TABLET | Freq: Three times a day (TID) | ORAL | 0 refills | Status: DC

## 2017-08-30 MED ORDER — PROPOFOL 10 MG/ML IV BOLUS
INTRAVENOUS | Status: DC | PRN
  Administered 2017-08-30 (×3): 20 mg via INTRAVENOUS

## 2017-08-30 MED ORDER — MIDAZOLAM HCL 2 MG/2ML IJ SOLN
1.0000 mg | INTRAMUSCULAR | Status: AC
  Administered 2017-08-30: 2 mg via INTRAVENOUS

## 2017-08-30 MED ORDER — PEG 3350-KCL-NA BICARB-NACL 420 G PO SOLR
4000.0000 mL | Freq: Once | ORAL | Status: AC
  Administered 2017-08-30: 4000 mL via ORAL
  Filled 2017-08-30: qty 4000

## 2017-08-30 MED ORDER — MIDAZOLAM HCL 2 MG/2ML IJ SOLN
INTRAMUSCULAR | Status: AC
  Filled 2017-08-30: qty 2

## 2017-08-30 MED ORDER — PROPOFOL 500 MG/50ML IV EMUL
INTRAVENOUS | Status: DC | PRN
  Administered 2017-08-30: 13:00:00 via INTRAVENOUS
  Administered 2017-08-30: 150 ug/kg/min via INTRAVENOUS

## 2017-08-30 NOTE — Anesthesia Preprocedure Evaluation (Signed)
Anesthesia Evaluation  Patient identified by MRN, date of birth, ID band Patient awake    Reviewed: Allergy & Precautions, NPO status , Patient's Chart, lab work & pertinent test results  Airway Mallampati: II  TM Distance: >3 FB Neck ROM: Full    Dental  (+) Teeth Intact   Pulmonary Current Smoker,    breath sounds clear to auscultation       Cardiovascular negative cardio ROS   Rhythm:Regular Rate:Normal     Neuro/Psych    GI/Hepatic   Endo/Other  negative endocrine ROS  Renal/GU negative Renal ROS     Musculoskeletal   Abdominal   Peds  Hematology negative hematology ROS (+)   Anesthesia Other Findings Chronic abdominal pain.....  Reproductive/Obstetrics                             Anesthesia Physical Anesthesia Plan  ASA: II  Anesthesia Plan: MAC   Post-op Pain Management:    Induction: Intravenous  PONV Risk Score and Plan:   Airway Management Planned: Simple Face Mask  Additional Equipment:   Intra-op Plan:   Post-operative Plan:   Informed Consent: I have reviewed the patients History and Physical, chart, labs and discussed the procedure including the risks, benefits and alternatives for the proposed anesthesia with the patient or authorized representative who has indicated his/her understanding and acceptance.     Plan Discussed with:   Anesthesia Plan Comments:         Anesthesia Quick Evaluation

## 2017-08-30 NOTE — Progress Notes (Signed)
[facility] Progress Note   Subjective: No major changes. Having epigastric / RLQ pain. Plans for colonoscopy today.   Objective: Vital signs in last 24 hours: Temp:  [97.9 F (36.6 C)-98.5 F (36.9 C)] 97.9 F (36.6 C) (01/18 0534) Pulse Rate:  [52-60] 58 (01/18 0534) Resp:  [16-18] 16 (01/18 0534) BP: (104-118)/(51-62) 104/51 (01/18 0534) SpO2:  [96 %-100 %] 97 % (01/18 0534) Last BM Date: 08/29/17  Intake/Output from previous day: 01/17 0701 - 01/18 0700 In: 2963.8 [P.O.:480; I.V.:2483.8] Out: 500 [Urine:500] Intake/Output this shift: No intake/output data recorded.  General appearance: alert, cooperative and no distress Resp: normal work breathing GI: soft, epigastric/ RLQ tenderness, nondistended Extremities: extremities normal, atraumatic, no cyanosis or edema  Lab Results:  Recent Labs    08/28/17 1302 08/30/17 0433  WBC 6.3 5.1  HGB 15.1 13.9  HCT 44.8 41.5  PLT 336 317   BMET Recent Labs    08/28/17 1302 08/30/17 0433  NA 138 142  K 3.5 3.7  CL 100* 108  CO2 27 26  GLUCOSE 88 83  BUN 13 11  CREATININE 0.92 0.84  CALCIUM 9.5 9.1    Studies/Results: Ct Abdomen Pelvis W Contrast  Result Date: 08/28/2017 CLINICAL DATA:  Upper abdominal pain since laparoscopy EXAM: CT ABDOMEN AND PELVIS WITH CONTRAST TECHNIQUE: Multidetector CT imaging of the abdomen and pelvis was performed using the standard protocol following bolus administration of intravenous contrast. Sagittal and coronal MPR images reconstructed from axial data set. CONTRAST:  Dilute oral contrast.  100 cc Isovue-300 IV. COMPARISON:  08/08/2017 FINDINGS: Lower chest: Lung bases clear Hepatobiliary: Gallbladder surgically absent. Liver normal appearance. No biliary dilatation. Pancreas: Normal appearance Spleen: Normal appearance Adrenals/Urinary Tract: Adrenal glands, kidneys, ureters, and bladder normal appearance Stomach/Bowel: Normal appendix. Oral contrast as passed through  most of the small bowel with lead edge near the ileocecal valve. No bowel dilatation or bowel wall thickening. No evidence of ileus. Colon decompressed. Stomach unremarkable. Vascular/Lymphatic: Vascular structures unremarkable. No adenopathy. Reproductive: Unremarkable prostate gland Other: Free air identified in the upper abdomen, lesser sac, and periportal. In the absence of evidence of contrast extravasation and free fluid, this most likely represents residual air from recent diagnostic laparoscopy. No hernia. Musculoskeletal: No acute osseous findings. IMPRESSION: Free air without evidence of free fluid or contrast extravasation, likely representing residual from recent diagnostic laparoscopy 2 days ago. Otherwise negative exam. Electronically Signed   By: Lavonia Dana M.D.   On: 08/28/2017 16:45   Dg Abd Acute W/chest  Result Date: 08/28/2017 CLINICAL DATA:  Two days postoperative from laparoscopic surgery performed in an effort to exclude to if evaluate the etiology for recurrent small-bowel obstructions. EXAM: DG ABDOMEN ACUTE W/ 1V CHEST COMPARISON:  Abdominal CT scan of August 08, 2017 FINDINGS: There is free subdiaphragmatic gas bilaterally presumably from the recent laparoscopic procedure. There remain loops of mildly distended gas-filled small bowel predominantly in the left upper quadrant with smaller air-fluid levels to the right of the lumbar spine. There is some gas within normal caliber colon and rectum. There surgical clips in the gallbladder fossa. The bony structures are unremarkable. IMPRESSION: Persistent small bowel obstruction. Free extraluminal gas collections that could be related to thelaparoscopic surgery 2 days ago. Correlation with the patient's current clinical exam and laboratory values will be needed in an effort to assure that these gas collections are merely postoperative and do not reflect a perforated viscus. These results were called by me by telephone at the time of  interpretation on 08/28/2017 at 2:06 pm to Dr. Francine Graven , who verbally acknowledged these results. Electronically Signed   By: David  Martinique M.D.   On: 08/28/2017 14:06    Assessment/Plan: [name] [name] is a 32 yo with chronic abdominal pain who underwent a diagnostic laparoscopy that revealed no etiology for the pain, and continues to have severe stabbing pain in the epigastric region which is his chronic pain. He is having a colonoscopy today. PRN for pain NPO for Colonoscopy today, taking Golytely  Home today after colonoscopy pending results  No surgical intervention indicated    LOS: 0 days    Virl Cagey 08/30/2017

## 2017-08-30 NOTE — Transfer of Care (Signed)
Immediate Anesthesia Transfer of Care Note  Patient: [name] [name]  Procedure(s) Performed: COLONOSCOPY WITH PROPOFOL (N/A ) BIOPSY POLYPECTOMY  Patient Location: PACU  Anesthesia Type:General  Level of Consciousness: awake and alert   Airway & Oxygen Therapy: Patient Spontanous Breathing and Patient connected to nasal cannula oxygen  Post-op Assessment: Report given to RN  Post vital signs: Reviewed and stable  Last Vitals:  Vitals:   08/30/17 1219 08/30/17 1230  BP: 116/68 114/63  Pulse:    Resp:  13  Temp: 36.4 C   SpO2:  96%    Last Pain:  Vitals:   08/30/17 1219  TempSrc: Oral  PainSc: 6       Patients Stated Pain Goal: 7 (23/34/35 6861)  Complications: No apparent anesthesia complications

## 2017-08-30 NOTE — Discharge Instructions (Signed)
Abdominal Pain, Adult Abdominal pain can be caused by many things. Often, abdominal pain is not serious and it gets better with no treatment or by being treated at home. However, sometimes abdominal pain is serious. Your health care provider will do a medical history and a physical exam to try to determine the cause of your abdominal pain. Follow these instructions at home:  Take over-the-counter and prescription medicines only as told by your health care provider. Do not take a laxative unless told by your health care provider.  Drink enough fluid to keep your urine clear or pale yellow.  Watch your condition for any changes.  Keep all follow-up visits as told by your health care provider. This is important. Contact a health care provider if:  Your abdominal pain changes or gets worse.  You are not hungry or you lose weight without trying.  You are constipated or have diarrhea for more than 2-3 days.  You have pain when you urinate or have a bowel movement.  Your abdominal pain wakes you up at night.  Your pain gets worse with meals, after eating, or with certain foods.  You are throwing up and cannot keep anything down.  You have a fever. Get help right away if:  Your pain does not go away as soon as your health care provider told you to expect.  You cannot stop throwing up.  Your pain is only in areas of the abdomen, such as the right side or the left lower portion of the abdomen.  You have bloody or black stools, or stools that look like tar.  You have severe pain, cramping, or bloating in your abdomen.  You have signs of dehydration, such as: ? Dark urine, very little urine, or no urine. ? Cracked lips. ? Dry mouth. ? Sunken eyes. ? Sleepiness. ? Weakness. This information is not intended to replace advice given to you by your health care provider. Make sure you discuss any questions you have with your health care provider. Document Released: 05/09/2005 Document  Revised: 02/17/2016 Document Reviewed: 01/11/2016 Elsevier Interactive Patient Education  2018 [employer]. Colonoscopy, Adult, Care After This sheet gives you information about how to care for yourself after your procedure. Your doctor may also give you more specific instructions. If you have problems or questions, call your doctor. Follow these instructions at home: General instructions   For the first 24 hours after the procedure: ? Do not drive or use machinery. ? Do not sign important documents. ? Do not drink alcohol. ? Do your daily activities more slowly than normal. ? Eat foods that are soft and easy to digest. ? Rest often.  Take over-the-counter or prescription medicines only as told by your doctor.  It is up to you to get the results of your procedure. Ask your doctor, or the department performing the procedure, when your results will be ready. To help cramping and bloating:  Try walking around.  Put heat on your belly (abdomen) as told by your doctor. Use a heat source that your doctor recommends, such as a moist heat pack or a heating pad. ? Put a towel between your skin and the heat source. ? Leave the heat on for 20-30 minutes. ? Remove the heat if your skin turns bright red. This is especially important if you cannot feel pain, heat, or cold. You can get burned. Eating and drinking  Drink enough fluid to keep your pee (urine) clear or pale yellow.  Return to your  normal diet as told by your doctor. Avoid heavy or fried foods that are hard to digest.  Avoid drinking alcohol for as long as told by your doctor. Contact a doctor if:  You have blood in your poop (stool) 2-3 days after the procedure. Get help right away if:  You have more than a small amount of blood in your poop.  You see large clumps of tissue (blood clots) in your poop.  Your belly is swollen.  You feel sick to your stomach (nauseous).  You throw up (vomit).  You have a fever.  You  have belly pain that gets worse, and medicine does not help your pain. This information is not intended to replace advice given to you by your health care provider. Make sure you discuss any questions you have with your health care provider. Document Released: 09/01/2010 Document Revised: 04/23/2016 Document Reviewed: 04/23/2016 Elsevier Interactive Patient Education  2017 [employer].   Propranolol tablets What is this medicine? PROPRANOLOL (proe PRAN oh lole) is a beta-blocker. Beta-blockers reduce the workload on the heart and help it to beat more regularly. This medicine is used to treat high blood pressure, to control irregular heart rhythms (arrhythmias) and to relieve chest pain caused by angina. It may also be helpful after a heart attack. This medicine is also used to prevent migraine headaches, or in your case possible abdominal migraines, relieve uncontrollable shaking (tremors), and help certain problems related to the thyroid gland and adrenal gland. This medicine may be used for other purposes; ask your health care provider or pharmacist if you have questions. COMMON BRAND NAME(S): Inderal What should I tell my health care provider before I take this medicine? They need to know if you have any of these conditions: -circulation problems or blood vessel disease -diabetes -history of heart attack or heart disease, vasospastic angina -kidney disease -liver disease -lung or breathing disease, like asthma or emphysema -pheochromocytoma -slow heart rate -thyroid disease -an unusual or allergic reaction to propranolol, other beta-blockers, medicines, foods, dyes, or preservatives -pregnant or trying to get pregnant -breast-feeding How should I use this medicine? Take this medicine by mouth with a glass of water. Follow the directions on the prescription label. Take your doses at regular intervals. Do not take your medicine more often than directed. Do not stop taking except on your  the advice of your doctor or health care professional. Talk to your pediatrician regarding the use of this medicine in children. Special care may be needed. Overdosage: If you think you have taken too much of this medicine contact a poison control center or emergency room at once. NOTE: This medicine is only for you. Do not share this medicine with others. What if I miss a dose? If you miss a dose, take it as soon as you can. If it is almost time for your next dose, take only that dose. Do not take double or extra doses. What may interact with this medicine? Do not take this medicine with any of the following medications: -feverfew -phenothiazines like chlorpromazine, mesoridazine, prochlorperazine, thioridazine This medicine may also interact with the following medications: -aluminum hydroxide gel -antipyrine -antiviral medicines for HIV or AIDS -barbiturates like phenobarbital -certain medicines for blood pressure, heart disease, irregular heart beat -cimetidine -ciprofloxacin -diazepam -fluconazole -haloperidol -isoniazid -medicines for cholesterol like cholestyramine or colestipol -medicines for mental depression -medicines for migraine headache like almotriptan, eletriptan, frovatriptan, naratriptan, rizatriptan, sumatriptan, zolmitriptan -NSAIDs, medicines for pain and inflammation, like ibuprofen or naproxen -phenytoin -rifampin -  teniposide -theophylline -thyroid medicines -tolbutamide -warfarin -zileuton This list may not describe all possible interactions. Give your health care provider a list of all the medicines, herbs, non-prescription drugs, or dietary supplements you use. Also tell them if you smoke, drink alcohol, or use illegal drugs. Some items may interact with your medicine. What should I watch for while using this medicine? Visit your doctor or health care professional for regular check ups. Check your blood pressure and pulse rate regularly. Ask your health  care professional what your blood pressure and pulse rate should be, and when you should contact them. You may get drowsy or dizzy. Do not drive, use machinery, or do anything that needs mental alertness until you know how this drug affects you. Do not stand or sit up quickly, especially if you are an older patient. This reduces the risk of dizzy or fainting spells. Alcohol can make you more drowsy and dizzy. Avoid alcoholic drinks. This medicine can affect blood sugar levels. If you have diabetes, check with your doctor or health care professional before you change your diet or the dose of your diabetic medicine. Do not treat yourself for coughs, colds, or pain while you are taking this medicine without asking your doctor or health care professional for advice. Some ingredients may increase your blood pressure. What side effects may I notice from receiving this medicine? Side effects that you should report to your doctor or health care professional as soon as possible: -allergic reactions like skin rash, itching or hives, swelling of the face, lips, or tongue -breathing problems -changes in blood sugar -cold hands or feet -difficulty sleeping, nightmares -dry peeling skin -hallucinations -muscle cramps or weakness -slow heart rate -swelling of the legs and ankles -vomiting Side effects that usually do not require medical attention (report to your doctor or health care professional if they continue or are bothersome): -change in sex drive or performance -diarrhea -dry sore eyes -hair loss -nausea -weak or tired This list may not describe all possible side effects. Call your doctor for medical advice about side effects. You may report side effects to FDA at 1-800-FDA-1088. Where should I keep my medicine? Keep out of the reach of children. Store at room temperature between 15 and 30 degrees C (59 and 86 degrees F). Protect from light. Throw away any unused medicine after the expiration  date. NOTE: This sheet is a summary. It may not cover all possible information. If you have questions about this medicine, talk to your doctor, pharmacist, or health care provider.  2018 Elsevier/Gold Standard (2013-04-03 14:51:53)   Dicyclomine tablets or capsules What is this medicine? DICYCLOMINE (dye SYE kloe meen) is used to treat bowel problems including irritable bowel syndrome. This medicine may be used for other purposes; ask your health care provider or pharmacist if you have questions. COMMON BRAND NAME(S): Bentyl What should I tell my health care provider before I take this medicine? They need to know if you have any of these conditions: -difficulty passing urine -esophagus problems or heartburn -glaucoma -heart disease, or previous heart attack -myasthenia gravis -prostate trouble -stomach infection, or obstruction -ulcerative colitis -an unusual or allergic reaction to dicyclomine, other medicines, foods, dyes, or preservatives -pregnant or trying to get pregnant -breast-feeding How should I use this medicine? Take this medicine by mouth with a glass of water. Follow the directions on the prescription label. It is best to take this medicine on an empty stomach, 30 minutes to 1 hour before meals. Take your medicine  at regular intervals. Do not take your medicine more often than directed. Talk to your pediatrician regarding the use of this medicine in children. Special care may be needed. While this drug may be prescribed for children as young as 50 months of age for selected conditions, precautions do apply. Patients over 54 years old may have a stronger reaction and need a smaller dose. Overdosage: If you think you have taken too much of this medicine contact a poison control center or emergency room at once. NOTE: This medicine is only for you. Do not share this medicine with others. What if I miss a dose? If you miss a dose, take it as soon as you can. If it is almost  time for your next dose, take only that dose. Do not take double or extra doses. What may interact with this medicine? -amantadine -antacids -benztropine -digoxin -disopyramide -medicines for allergies, colds and breathing difficulties -medicines for alzheimer's disease -medicines for anxiety or sleeping problems -medicines for depression or psychotic disturbances -medicines for diarrhea -medicines for pain -metoclopramide -tegaserod This list may not describe all possible interactions. Give your health care provider a list of all the medicines, herbs, non-prescription drugs, or dietary supplements you use. Also tell them if you smoke, drink alcohol, or use illegal drugs. Some items may interact with your medicine. What should I watch for while using this medicine? You may get drowsy, dizzy, or have blurred vision. Do not drive, use machinery, or do anything that needs mental alertness until you know how this medicine affects you. To reduce the risk of dizzy or fainting spells, do not sit or stand up quickly, especially if you are an older patient. Alcohol can make you more drowsy, avoid alcoholic drinks. Stay out of bright light and wear sunglasses if this medicine makes your eyes more sensitive to light. Avoid extreme heat (hot tubs, saunas). This medicine can cause you to sweat less than normal. Your body temperature could increase to dangerous levels, which may lead to heat stroke. Antacids can stop this medicine from working. If you get an upset stomach and want to take an antacid, make sure there is an interval of at least 1 to 2 hours before or after you take this medicine. Your mouth may get dry. Chewing sugarless gum or sucking hard candy, and drinking plenty of water may help. Contact your doctor if the problem does not go away or is severe. What side effects may I notice from receiving this medicine? Side effects that you should report to your doctor or health care professional as  soon as possible: -agitation, nervousness, confusion -difficulty swallowing -dizziness, drowsiness -fast or slow heartbeat -hallucinations -pain or difficulty passing urine Side effects that usually do not require medical attention (report to your doctor or health care professional if they continue or are bothersome): -constipation -headache -nausea or vomiting -sexual difficulty This list may not describe all possible side effects. Call your doctor for medical advice about side effects. You may report side effects to FDA at 1-800-FDA-1088. Where should I keep my medicine? Keep out of the reach of children. Store at room temperature below 30 degrees C (86 degrees F). Protect from light. Throw away any unused medicine after the expiration date. NOTE: This sheet is a summary. It may not cover all possible information. If you have questions about this medicine, talk to your doctor, pharmacist, or health care provider.  2018 Elsevier/Gold Standard (2007-11-18 17:12:34)

## 2017-08-30 NOTE — Progress Notes (Signed)
  Subjective:  Patient feels better.  He states he has not taken pain medication last for hours.  He continues to complain of pain across upper abdomen and right lower quadrant.  States right lower quadrant pain is new.  He has been passing flatus but did not pass any more blood or stool during the night.  Objective: Blood pressure (!) 104/51, pulse (!) 58, temperature 97.9 F (36.6 C), temperature source Oral, resp. rate 16, height 5\' 9"  (1.753 m), weight 186 lb 9.6 oz (84.6 kg), SpO2 97 %. Patient appears to be comfortable lying in supine position. .Abdomen is symmetrical and not distended.  Bowel sounds are normal.  Laparoscopy sites unremarkable.  He has moderate tenderness in mid epigastric region as well as right upper quadrant and right lower quadrant with some guarding no rebound.  No organomegaly or masses. No LE edema or clubbing noted.  Labs/studies Results:  Recent Labs    09/10/17 1302 08/30/17 0433  WBC 6.3 5.1  HGB 15.1 13.9  HCT 44.8 41.5  PLT 336 317    BMET  Recent Labs    2017-09-10 1302 08/30/17 0433  NA 138 142  K 3.5 3.7  CL 100* 108  CO2 27 26  GLUCOSE 88 83  BUN 13 11  CREATININE 0.92 0.84  CALCIUM 9.5 9.1    LFT  Recent Labs    2017/09/10 1302  PROT 7.9  ALBUMIN 4.5  AST 20  ALT 15*  ALKPHOS 54  BILITOT 0.9    Serum IgE and urinary PBG are pending.  Assessment:  #1.  Recurrent abdominal pain with extensive workup and no etiology determined.  He was begun on low-dose propranolol for possible abdominal migraine.  Urinary PBG and IgE are pending.  #2.  Diarrhea and rectal bleeding.  H&H is normal.  I doubt that he has inflammatory bowel disease.  Even if he has it would be focal to explain his pain.  Recommendations.:  Proceed with GoLYTELY prep for colonoscopy. Colonoscopy under monitored anesthesia care later today.

## 2017-08-30 NOTE — Anesthesia Postprocedure Evaluation (Signed)
Anesthesia Post Note  Patient: [name] [name]  Procedure(s) Performed: COLONOSCOPY WITH PROPOFOL (N/A ) BIOPSY POLYPECTOMY  Patient location during evaluation: PACU Anesthesia Type: MAC Level of consciousness: awake and alert and patient cooperative Pain management: satisfactory to patient Vital Signs Assessment: post-procedure vital signs reviewed and stable Respiratory status: spontaneous breathing Cardiovascular status: stable Postop Assessment: no apparent nausea or vomiting Anesthetic complications: no     Last Vitals:  Vitals:   08/30/17 1230 08/30/17 1325  BP: 114/63 (!) 109/48  Pulse:    Resp: 13 12  Temp:  36.6 C  SpO2: 96%     Last Pain:  Vitals:   08/30/17 1325  TempSrc:   PainSc: 8                  Artavius Stearns

## 2017-08-30 NOTE — Anesthesia Postprocedure Evaluation (Signed)
Anesthesia Post Note  Patient: [name] [name]  Procedure(s) Performed: COLONOSCOPY WITH PROPOFOL (N/A ) BIOPSY POLYPECTOMY  Patient location during evaluation: PACU Anesthesia Type: MAC Level of consciousness: awake and alert and oriented Pain management: pain level controlled Vital Signs Assessment: post-procedure vital signs reviewed and stable Respiratory status: spontaneous breathing Cardiovascular status: blood pressure returned to baseline Postop Assessment: no apparent nausea or vomiting Anesthetic complications: no     Last Vitals:  Vitals:   08/30/17 1230 08/30/17 1325  BP: 114/63 (!) (P) 109/48  Pulse:    Resp: 13 (P) 12  Temp:  (P) 36.6 C  SpO2: 96%     Last Pain:  Vitals:   08/30/17 1219  TempSrc: Oral  PainSc: 6                  Deloss Amico

## 2017-08-30 NOTE — Discharge Summary (Signed)
Physician Discharge Summary  Patient ID: [name] [name] MRN: [id number] DOB/AGE: [birth year] 32 y.o.  Admit date: 08/28/2017 Discharge date: 08/30/2017  Admission Diagnoses: Chronic Abdominal pain   Discharge Diagnoses:  Active Problems:   Abdominal pain   Discharged Condition: fair  Hospital Course: [name] [name] is an unfortunate 32 year gentleman that has been suffering with chronic epigastric pain that started in Nov. 2018, and has been relentless since that time. He has had anorexia associated with the pain, and has undergone a full work up including, EGD with enteroscopy and biopsy showing no abnormalities, CT enteroscopy that was normal, diagnostic laparoscopy that was normal, and now a colonoscopy that showed small internal hemorrhoids and a small polyp that was removed. The patient presented POD 2 s/p Diagnostic laparoscopy and was having severe abdominal pain and workup demonstrated no findings on CT scan and his labs were normal. He as brought into the hospital for pain control and further workup. Dr. Laural [name] saw him and planned for the colonoscopy which was completed today.   Consults: GI- Dr. Laural [name]   Significant Diagnostic Studies: CT a/p- no acute abnormality, normal bowel gas pattern, contrast through to the colon, pneumoperitoneum from the diagnostic laparoscopy, no extravasation of contrast; Colonoscopy with random biopsies and polyp removal   Treatments: IV hydration and propranolol and bentyl started for possible abdominal migraines/ functional abdominal pain   Discharge Exam: Blood pressure (!) 138/55, pulse 62, temperature 97.9 F (36.6 C), resp. rate 16, height 5\' 9"  (1.753 m), weight 186 lb 9.6 oz (84.6 kg), SpO2 100 %. General appearance: alert, cooperative and no distress Resp: normal work breathing GI: soft, nondistended, epigastric tender, port sites intact and no redness or drainage Extremities: extremities normal, atraumatic, no cyanosis or edema  Disposition: 01-Home  or Self Care  Discharge Instructions    Call MD for:  difficulty breathing, headache or visual disturbances   Complete by:  As directed    Call MD for:  extreme fatigue   Complete by:  As directed    Call MD for:  persistant dizziness or light-headedness   Complete by:  As directed    Call MD for:  persistant nausea and vomiting   Complete by:  As directed    Call MD for:  redness, tenderness, or signs of infection (pain, swelling, redness, odor or green/yellow discharge around incision site)   Complete by:  As directed    Call MD for:  severe uncontrolled pain   Complete by:  As directed    Call MD for:  temperature >100.4   Complete by:  As directed    Diet - low sodium heart healthy   Complete by:  As directed    Increase activity slowly   Complete by:  As directed      Allergies as of 08/30/2017   No Known Allergies     Medication List    TAKE these medications   dicyclomine 10 MG capsule Commonly known as:  BENTYL Take 1 capsule (10 mg total) by mouth 3 (three) times daily before meals.   omeprazole 40 MG capsule Commonly known as:  PRILOSEC Take 1 capsule (40 mg total) by mouth daily.   ondansetron 4 MG tablet Commonly known as:  ZOFRAN Take 1 tablet (4 mg total) by mouth every 8 (eight) hours as needed for nausea or vomiting.   oxyCODONE-acetaminophen 10-325 MG tablet Commonly known as:  PERCOCET Take 1 tablet by mouth every 6 (six) hours as needed for pain.   propranolol  10 MG tablet Commonly known as:  INDERAL Take 1 tablet (10 mg total) by mouth 3 (three) times daily.   tetrahydrozoline 0.05 % ophthalmic solution Place into both eyes daily as needed (dryness).      Follow-up Information    [name], Mechele Dawley, MD Follow up.   Specialty:  Gastroenterology Contact information: [place], [address] [address] [phone]          Future Appointments  Date Time Provider [place]  09/03/2017 11:00 AM Virl Cagey, MD  [facility] None     Signed: Virl Cagey 08/30/2017, 1:54 PM

## 2017-08-30 NOTE — Op Note (Signed)
[facility] Patient Name: [name] [name] Procedure Date: 08/30/2017 12:31 PM MRN: [id number] Date of Birth: [birth year] Attending MD: Hildred Laser , MD CSN: [id number] Age: 32 Admit Type: Outpatient Procedure:                Colonoscopy Indications:              Chronic diarrhea, Hematochezia Providers:                Hildred Laser, MD, Otis Peak B. Sharon Seller, RN, Starla Link RN, RN Referring MD:             Curlene Labrum, MD and Dr. Redmond School, MD Medicines:                Propofol per Anesthesia Complications:            No immediate complications. Estimated Blood Loss:     Estimated blood loss was minimal. Procedure:                Pre-Anesthesia Assessment:                           - Prior to the procedure, a History and Physical                            was performed, and patient medications and                            allergies were reviewed. The patient's tolerance of                            previous anesthesia was also reviewed. The risks                            and benefits of the procedure and the sedation                            options and risks were discussed with the patient.                            All questions were answered, and informed consent                            was obtained. Prior Anticoagulants: The patient has                            taken no previous anticoagulant or antiplatelet                            agents. ASA Grade Assessment: II - A patient with                            mild systemic disease. After reviewing the risks  and benefits, the patient was deemed in                            satisfactory condition to undergo the procedure.                           After obtaining informed consent, the colonoscope                            was passed under direct vision. Throughout the                            procedure, the patient's blood pressure, pulse, and                          oxygen saturations were monitored continuously. The                            EC-[id number] ([id number]) was introduced through the                            anus and advanced to the the terminal ileum, with                            identification of the appendiceal orifice and IC                            valve. The colonoscopy was performed without                            difficulty. The patient tolerated the procedure                            well. The quality of the bowel preparation was                            excellent. The terminal ileum, ileocecal valve,                            appendiceal orifice, and rectum were photographed. Scope In: 12:57:44 PM Scope Out: 1:15:55 PM Total Procedure Duration: 0 hours 18 minutes 11 seconds  Findings:      The perianal and digital rectal examinations were normal.      The terminal ileum appeared normal.      A small polyp was found in the mid sigmoid colon. The polyp was sessile.       Biopsies were taken with a cold forceps for histology. The pathology       specimen was placed into Bottle Number 2.      The exam was otherwise normal throughout the examined colon.      Biopsies were taken with a cold forceps in the mid sigmoid colon for       histology.      External hemorrhoids were found during retroflexion. The hemorrhoids       were small.      Anal papilla(e) were  hypertrophied. Impression:               - The examined portion of the ileum was normal.                           - One small polyp in the mid sigmoid colon.                            Biopsied.                           - External hemorrhoids.                           - Anal papilla(e) were hypertrophied.                           - Biopsies were taken with a cold forceps for                            histology in the mid sigmoid colon. Moderate Sedation:      Per Anesthesia Care Recommendation:           - Patient has a contact number  available for                            emergencies. The signs and symptoms of potential                            delayed complications were discussed with the                            patient. Return to normal activities tomorrow.                            Written discharge instructions were provided to the                            patient.                           - Return patient to hospital ward for possible                            discharge same day.                           - Resume regular diet today.                           - Await pathology results.                           - Dicyclomine 10 mg po ac.                           - Continue present medications.                           -  Repeat colonoscopy at age 47 for screening                            purposes. Procedure Code(s):        --- Professional ---                           [id number], Colonoscopy, flexible; with biopsy, single                            or multiple Diagnosis Code(s):        --- Professional ---                           K64.4, Residual hemorrhoidal skin tags                           D12.5, Benign neoplasm of sigmoid colon                           K62.89, Other specified diseases of anus and rectum                           K52.9, Noninfective gastroenteritis and colitis,                            unspecified                           K92.1, Melena (includes Hematochezia) CPT copyright 2016 American Medical Association. All rights reserved. The codes documented in this report are preliminary and upon coder review may  be revised to meet current compliance requirements. Hildred Laser, MD Hildred Laser, MD 08/30/2017 1:30:30 PM This report has been signed electronically. Number of Addenda: 0

## 2017-08-30 NOTE — Progress Notes (Signed)
Removed IV-clean, dry, and intact. Gina reviewed d/c paperwork with pt and wife and then wheeled stable pt to main entrance and into the car.

## 2017-08-30 NOTE — Addendum Note (Signed)
Addendum  created 08/30/17 1341 by Vista Deck, CRNA   Sign clinical note

## 2017-08-30 NOTE — Progress Notes (Signed)
Brief colonoscopy note.  Normal terminal ileum. Small polyp at sigmoid colon otherwise normal colonoscopy. Polyp was ablated via cold biopsy. Random biopsy taken from mucosa of sigmoid colon because of chronic diarrhea. Small external hemorrhoids and anal papillae.    We will start patient on dicyclomine 10 mg before each meal.

## 2017-09-02 LAB — PORPHOBILINOGEN, RANDOM URINE: QUANTITATIVE PORPHOBILINOGEN: 0.8 mg/L (ref 0.0–2.0)

## 2017-09-02 LAB — IGE: IgE (Immunoglobulin E), Serum: 129 IU/mL — ABNORMAL HIGH (ref 0–100)

## 2017-09-03 ENCOUNTER — Encounter: Payer: Self-pay | Admitting: General Surgery

## 2017-09-03 ENCOUNTER — Ambulatory Visit (INDEPENDENT_AMBULATORY_CARE_PROVIDER_SITE_OTHER): Payer: Self-pay | Admitting: General Surgery

## 2017-09-03 VITALS — BP 129/69 | HR 61 | Temp 98.4°F | Resp 18 | Ht 70.0 in | Wt 192.0 lb

## 2017-09-03 DIAGNOSIS — Z1389 Encounter for screening for other disorder: Secondary | ICD-10-CM | POA: Diagnosis not present

## 2017-09-03 DIAGNOSIS — R1013 Epigastric pain: Secondary | ICD-10-CM

## 2017-09-03 DIAGNOSIS — E663 Overweight: Secondary | ICD-10-CM | POA: Diagnosis not present

## 2017-09-03 DIAGNOSIS — G8929 Other chronic pain: Secondary | ICD-10-CM

## 2017-09-03 DIAGNOSIS — R109 Unspecified abdominal pain: Secondary | ICD-10-CM | POA: Diagnosis not present

## 2017-09-03 DIAGNOSIS — G43D0 Abdominal migraine, not intractable: Secondary | ICD-10-CM | POA: Diagnosis not present

## 2017-09-03 DIAGNOSIS — K56609 Unspecified intestinal obstruction, unspecified as to partial versus complete obstruction: Secondary | ICD-10-CM | POA: Diagnosis not present

## 2017-09-03 DIAGNOSIS — Z6829 Body mass index (BMI) 29.0-29.9, adult: Secondary | ICD-10-CM | POA: Diagnosis not present

## 2017-09-03 NOTE — Progress Notes (Signed)
[facility] Note   HPI:  32 y.o. Male presents to clinic for post-op follow-up evaluation after a diagnostic laparoscopy to evaluate for an etiology of his chronic epigastric pain that has been severe and life limiting for the past 6-8 weeks. We did not find anything on the laparoscopy, and he was admitted to the hospital 2 days post op with severe pain that was similar to his previous episodes and his workup including CT and labs demonstrated no finding.  During that hospital course, he underwent a colonoscopy that did not demonstrate an cause for his pain and a small polyp was removed. Dr. Laural Golden also took random biopsies. After much thought, Dr. Laural Golden and myself felt that abdominal migraines could be a possibility and he was started on propranolol and bentyl.  He has been taking this and is doing better. He is still having pain but the are limit to 45 seconds of severe pain.  All of his fecal tests including culture, ova, parasite, leukesterase, C dif, etc were negative to date. Dr. Laural Golden has run some other test that are pending.  His IgE was mildly elevated but not likely significant per my conversation with Dr. Laural Golden.  Review of Systems:  Continue pain but improved Eating and drinking small meals 3 times /day All other review of systems: otherwise negative   Vital Signs:  BP 129/69   Pulse 61   Temp 98.4 F (36.9 C)   Resp 18   Ht 5\' 10"  (1.778 m)   Wt 192 lb (87.1 kg)   BMI 27.55 kg/m    Physical Exam:  Physical Exam  Constitutional: He is oriented to person, place, and time and well-developed, well-nourished, and in no distress.  Cardiovascular: Normal rate and regular rhythm.  Pulmonary/Chest: Effort normal.  Abdominal: Soft. He exhibits no distension. There is tenderness in the epigastric area.  Port sites healing, dermabond intact, no erythema or drainage  Musculoskeletal: Normal range of motion.  Neurological: He is alert and oriented to person, place,  and time.  Vitals reviewed.   Laboratory studies: None   Imaging:  None   Assessment:  32 y.o. yo Male with chronic epigastric abdominal pain of unknown etiology that could potentially be functional/ related to abdominal migraines. He is having some improvement with propranol and bentyl.  The diagnostic laparoscopy was negative.  Plan:  - Healing from the diagnostic laparoscopy   - Continue propranolol and bentyl, Dr. Laural Golden or Dr. Gerarda Fraction will do these refills  - Follow up with me PRN - Fields will notify me with any findings as I hope he continues to improve and figures out what is going on    All of the above recommendations were discussed with the patient, and all of patient's were answered to his expressed satisfaction.  Curlene Labrum, MD [facility] [address] [place], [place] [address] [phone] (office)

## 2017-09-04 ENCOUNTER — Encounter (HOSPITAL_COMMUNITY): Payer: Self-pay | Admitting: Internal Medicine

## 2017-09-10 ENCOUNTER — Other Ambulatory Visit: Payer: Self-pay | Admitting: General Surgery

## 2017-09-10 DIAGNOSIS — R109 Unspecified abdominal pain: Secondary | ICD-10-CM | POA: Diagnosis not present

## 2017-09-10 DIAGNOSIS — R1013 Epigastric pain: Secondary | ICD-10-CM | POA: Diagnosis not present

## 2017-09-10 DIAGNOSIS — R197 Diarrhea, unspecified: Secondary | ICD-10-CM | POA: Diagnosis not present

## 2017-09-10 DIAGNOSIS — R112 Nausea with vomiting, unspecified: Secondary | ICD-10-CM | POA: Diagnosis not present

## 2017-09-12 LAB — FECAL OCCULT BLOOD, IMMUNOCHEMICAL: Fecal Occult Bld: POSITIVE — AB

## 2017-09-23 DIAGNOSIS — R195 Other fecal abnormalities: Secondary | ICD-10-CM | POA: Diagnosis not present

## 2017-09-23 DIAGNOSIS — R109 Unspecified abdominal pain: Secondary | ICD-10-CM | POA: Diagnosis not present

## 2017-09-23 DIAGNOSIS — G8929 Other chronic pain: Secondary | ICD-10-CM | POA: Diagnosis not present

## 2017-10-08 ENCOUNTER — Ambulatory Visit (INDEPENDENT_AMBULATORY_CARE_PROVIDER_SITE_OTHER): Payer: 59 | Admitting: Internal Medicine

## 2017-10-08 VITALS — BP 122/76 | Wt 189.8 lb

## 2017-10-08 DIAGNOSIS — G43D0 Abdominal migraine, not intractable: Secondary | ICD-10-CM | POA: Diagnosis not present

## 2017-10-08 MED ORDER — DICYCLOMINE HCL 10 MG PO CAPS: 10.0000 mg | ORAL_CAPSULE | Freq: Three times a day (TID) | ORAL | 5 refills | Status: AC

## 2017-10-08 MED ORDER — PROPRANOLOL HCL ER 60 MG PO CP24: 60.0000 mg | ORAL_CAPSULE | Freq: Every day | ORAL | 5 refills | Status: AC

## 2017-10-08 NOTE — Progress Notes (Signed)
Presenting complaint;  Follow-up for abdominal pain.  Database and subjective:  Patient is 32 year old Caucasian male who has a history of abdominal pain associated nausea and vomiting who has undergone extensive workup which has been essentially negative.  In addition to imaging studies he underwent EGD as well as push enteroscopy diagnostic laparoscopy and colonoscopy.  IgE and IgG4 levels were normal.  Similarly screening test for celiac disease were negative.  Similarly testing for acute intermittent porphyria was negative. It was finally decided that we may be dealing with abdominal migraine.  He may also have an IBS.  He was discharged on Inderal and dicyclomine. He now returns for follow-up visit. He feels better.  He stopped Prilosec 2 weeks ago without any difficulty or rebound heartburn.  He remains with daily pain which is mainly in upper mid abdomen.  It is mild.  He still has spells where pain increases but it has not reached the level that it did before.  Similar spell this morning when he took the medication and noted improvement.  He is trying to wean himself off the pain medication and he is not taking 2 pills a day.  He had an episode of vomiting about 3 weeks ago.  His appetite has improved but it is not normal.  He states he has gained a few pounds.  This he had lost some weight.  His bowels move daily.  He denies melena or rectal bleeding. He is still smoking but trying to work his way down and eventually he even quit. He is not having any side effects with propranolol. He wonders if he has allergy to molds.  He is paying attention to pattern of his symptoms.   Current Medications: Outpatient Encounter Medications as of 10/08/2017  Medication Sig  . dicyclomine (BENTYL) 10 MG capsule Take 1 capsule (10 mg total) by mouth 3 (three) times daily before meals.  . ondansetron (ZOFRAN) 4 MG tablet Take 1 tablet (4 mg total) by mouth every 8 (eight) hours as needed for nausea or  vomiting.  Marland Kitchen oxyCODONE-acetaminophen (PERCOCET) 10-325 MG tablet Take 1 tablet by mouth every 6 (six) hours as needed for pain.  Marland Kitchen propranolol (INDERAL) 10 MG tablet Take 1 tablet (10 mg total) by mouth 3 (three) times daily.  Marland Kitchen tetrahydrozoline 0.05 % ophthalmic solution Place into both eyes daily as needed (dryness).  . [DISCONTINUED] omeprazole (PRILOSEC) 40 MG capsule Take 1 capsule (40 mg total) by mouth daily. (Patient not taking: Reported on 10/08/2017)   No facility-administered encounter medications on file as of 10/08/2017.      Objective: Blood pressure 122/76, weight 189 lb 12.8 oz (86.1 kg). Patient is alert and in no acute distress. Conjunctiva is pink. Sclera is nonicteric Oropharyngeal mucosa is normal. No neck masses or thyromegaly noted. Cardiac exam with regular rhythm normal S1 and S2. No murmur or gallop noted. Lungs are clear to auscultation. Abdomen is symmetrical.  Bowel sounds are normal.  On palpation abdomen is soft.  He has mild tenderness in mid epigastric region without guarding or rebound.  No organomegaly or masses. No LE edema or clubbing noted.    Assessment:  #1.  Abdominal pain associated with nausea and vomiting.  Extensive workup has been negative.  He did have an episode in December 2018 when abdominopelvic CT suggested jejunitis.  Stool studies were negative.  Push enteroscopy with biopsies from the jejunum reveals no abnormality.  He also has undergone testing for porphyria celiac disease as well as IgE  and IgE for mediated disease and all of the studies were negative including diagnostic laparoscopy. Working diagnosis is migraine and he appears to be doing better with therapy.  He is on low-dose propranolol and may benefit from higher dose of tolerated.   Plan:  Increase propranolol dose to 60 mg daily.  He will start long-acting preparation.  He will take first dose Friday night just to make sure he does not have any side effects. He will  continue dicyclomine as before. Patient advised to keep symptom diary until his office visit in 4 months.

## 2017-10-08 NOTE — Patient Instructions (Signed)
Keep symptom diary as discussed.

## 2017-10-23 ENCOUNTER — Telehealth (INDEPENDENT_AMBULATORY_CARE_PROVIDER_SITE_OTHER): Payer: Self-pay | Admitting: *Deleted

## 2017-10-23 NOTE — Telephone Encounter (Signed)
Patient called stating he needs a refill on dicyclomine he only has one left.   [place]

## 2017-10-23 NOTE — Telephone Encounter (Signed)
Patient already has refills for the Dicyclomine at [facility]. They were sent in 10/08/2017. Patient was called and made aware.

## 2017-11-13 DIAGNOSIS — M779 Enthesopathy, unspecified: Secondary | ICD-10-CM | POA: Diagnosis not present

## 2017-11-13 DIAGNOSIS — M79671 Pain in right foot: Secondary | ICD-10-CM | POA: Diagnosis not present

## 2017-11-26 DIAGNOSIS — E663 Overweight: Secondary | ICD-10-CM | POA: Diagnosis not present

## 2017-11-26 DIAGNOSIS — G894 Chronic pain syndrome: Secondary | ICD-10-CM | POA: Diagnosis not present

## 2017-11-26 DIAGNOSIS — Z6828 Body mass index (BMI) 28.0-28.9, adult: Secondary | ICD-10-CM | POA: Diagnosis not present

## 2017-11-26 DIAGNOSIS — M87851 Other osteonecrosis, right femur: Secondary | ICD-10-CM | POA: Diagnosis not present

## 2017-11-26 DIAGNOSIS — Z1389 Encounter for screening for other disorder: Secondary | ICD-10-CM | POA: Diagnosis not present

## 2018-01-30 DIAGNOSIS — M87851 Other osteonecrosis, right femur: Secondary | ICD-10-CM | POA: Diagnosis not present

## 2018-01-30 DIAGNOSIS — Z1389 Encounter for screening for other disorder: Secondary | ICD-10-CM | POA: Diagnosis not present

## 2018-01-30 DIAGNOSIS — Z0001 Encounter for general adult medical examination with abnormal findings: Secondary | ICD-10-CM | POA: Diagnosis not present

## 2018-01-30 DIAGNOSIS — G894 Chronic pain syndrome: Secondary | ICD-10-CM | POA: Diagnosis not present

## 2018-01-30 DIAGNOSIS — Z6826 Body mass index (BMI) 26.0-26.9, adult: Secondary | ICD-10-CM | POA: Diagnosis not present

## 2018-01-30 DIAGNOSIS — L0889 Other specified local infections of the skin and subcutaneous tissue: Secondary | ICD-10-CM | POA: Diagnosis not present

## 2018-02-04 ENCOUNTER — Ambulatory Visit (INDEPENDENT_AMBULATORY_CARE_PROVIDER_SITE_OTHER): Payer: 59 | Admitting: Internal Medicine

## 2018-03-25 ENCOUNTER — Encounter (INDEPENDENT_AMBULATORY_CARE_PROVIDER_SITE_OTHER): Payer: Self-pay | Admitting: Internal Medicine

## 2018-03-25 ENCOUNTER — Ambulatory Visit (INDEPENDENT_AMBULATORY_CARE_PROVIDER_SITE_OTHER): Payer: 59 | Admitting: Internal Medicine

## 2018-04-22 DIAGNOSIS — G894 Chronic pain syndrome: Secondary | ICD-10-CM | POA: Diagnosis not present

## 2018-04-22 DIAGNOSIS — Z6825 Body mass index (BMI) 25.0-25.9, adult: Secondary | ICD-10-CM | POA: Diagnosis not present

## 2018-04-22 DIAGNOSIS — Z1389 Encounter for screening for other disorder: Secondary | ICD-10-CM | POA: Diagnosis not present

## 2018-04-22 DIAGNOSIS — F329 Major depressive disorder, single episode, unspecified: Secondary | ICD-10-CM | POA: Diagnosis not present

## 2018-04-22 DIAGNOSIS — E663 Overweight: Secondary | ICD-10-CM | POA: Diagnosis not present

## 2018-05-14 DIAGNOSIS — K644 Residual hemorrhoidal skin tags: Secondary | ICD-10-CM | POA: Diagnosis not present

## 2018-05-15 ENCOUNTER — Emergency Department (HOSPITAL_COMMUNITY)
Admission: EM | Admit: 2018-05-15 | Discharge: 2018-05-15 | Disposition: A | Discharge status: Home / Self Care | Payer: 59 | Attending: Emergency Medicine | Admitting: Emergency Medicine

## 2018-05-15 ENCOUNTER — Other Ambulatory Visit: Payer: Self-pay

## 2018-05-15 DIAGNOSIS — F17210 Nicotine dependence, cigarettes, uncomplicated: Secondary | ICD-10-CM | POA: Diagnosis not present

## 2018-05-15 DIAGNOSIS — K644 Residual hemorrhoidal skin tags: Secondary | ICD-10-CM | POA: Insufficient documentation

## 2018-05-15 DIAGNOSIS — Z79899 Other long term (current) drug therapy: Secondary | ICD-10-CM | POA: Insufficient documentation

## 2018-05-15 DIAGNOSIS — K6289 Other specified diseases of anus and rectum: Secondary | ICD-10-CM | POA: Diagnosis present

## 2018-05-15 MED ORDER — HYDROCORTISONE ACE-PRAMOXINE 1-1 % RE FOAM: 1.0000 | Freq: Two times a day (BID) | RECTAL | 0 refills | Status: AC

## 2018-05-15 MED ORDER — HYDROCORTISONE ACE-PRAMOXINE 1-1 % RE FOAM
1.0000 | Freq: Two times a day (BID) | RECTAL | Status: DC
Start: 2018-05-15 — End: 2018-05-15
  Filled 2018-05-15: qty 10

## 2018-05-15 NOTE — ED Triage Notes (Signed)
Pt applied proctofoam with out difficulty  After instruction .

## 2018-05-15 NOTE — ED Notes (Signed)
Declined W/C at D/C and was escorted to lobby by RN. 

## 2018-05-15 NOTE — ED Provider Notes (Signed)
[facility] EMERGENCY DEPARTMENT Provider Note   CSN: [id number] Arrival date & time: 05/15/18  1559     History   Chief Complaint Chief Complaint  Patient presents with  . Hemorrhoids    HPI [name] [name] is a 32 y.o. male.  HPI   Patient is a 32 year old male who presents the emergency department today for evaluation of hemorrhoid pain.  Rates pain as severe in nature and constant.  He states that yesterday he was seen at another hospital and had the hemorrhoid lanced.  He was given information to follow-up with a surgeon but is unable to follow-up until next week.  He has been trying sitz bath at home with no significant relief.  He is also been taking ibuprofen with no significant relief.  Denies fevers chills or other symptoms.  He reports he has had some bleeding from the area.  States he has been having soft stools but bowel movements are still painful because of his hemorrhoid.  Past Medical History:  Diagnosis Date  . Enteritis   . History of Legg-Calve-Perthes disease   . Small bowel obstruction [facility])     Patient Active Problem List   Diagnosis Date Noted  . Diarrhea 08/15/2017  . Intestinal adhesions with obstruction ([place]) 08/14/2017  . Dehydration 07/20/2017  . Enteritis 07/20/2017  . Hypokalemia 07/20/2017  . Tobacco abuse 07/20/2017  . Diarrhea of presumed infectious origin   . Intussusception ([place])   . Colitis 07/17/2017  . Perforation of intestine ([place]) 07/17/2017  . Abnormal CT scan, small bowel   . Abdominal pain, chronic, epigastric   . Ileus ([place]) 02/09/2015  . Hypotension 02/06/2015  . Nausea vomiting and diarrhea 02/06/2015  . Partial small bowel obstruction ([place]) 10/27/2013  . Abdominal pain 10/27/2013    Past Surgical History:  Procedure Laterality Date  . ABDOMINAL SURGERY    . BIOPSY N/A 08/02/2017   Procedure: BIOPSY;  Surgeon: Rogene Houston, MD;  [facility];  Service: Endoscopy;  Laterality: N/A;  .  BIOPSY  08/30/2017   Procedure: BIOPSY;  Surgeon: Rogene Houston, MD;  Location: [facility];  Service: Endoscopy;;  colon  . CHOLECYSTECTOMY    . COLONOSCOPY WITH PROPOFOL N/A 08/30/2017   Procedure: COLONOSCOPY WITH PROPOFOL;  Surgeon: Rogene Houston, MD;  Location: [facility];  Service: Endoscopy;  Laterality: N/A;  . ENTEROSCOPY N/A 08/02/2017   Procedure: PUSH ENTEROSCOPY;  Surgeon: Rogene Houston, MD;  Location: [facility];  Service: Endoscopy;  Laterality: N/A;  . ESOPHAGOGASTRODUODENOSCOPY (EGD) WITH PROPOFOL N/A 08/02/2017   Procedure: ESOPHAGOGASTRODUODENOSCOPY (EGD) WITH PROPOFOL;  Surgeon: Rogene Houston, MD;  [facility]: [facility];  Service: Endoscopy;  Laterality: N/A;  8:55am  . HERNIA REPAIR    . HIP ARTHROSCOPY  2018   right hip  . LAPAROSCOPY N/A 08/26/2017   Procedure: DIAGNOSTIC LAPAROSCOPY;  Surgeon: Virl Cagey, MD;  Location: [facility];  Service: General;  Laterality: N/A;  . POLYPECTOMY  08/30/2017   Procedure: POLYPECTOMY;  Surgeon: Rogene Houston, MD;  [facility];  Service: Endoscopy;;  colon        Home Medications    Prior to Admission medications   Medication Sig Start Date End Date Taking? Authorizing Provider  dicyclomine (BENTYL) 10 MG capsule Take 1 capsule (10 mg total) by mouth 3 (three) times daily before meals. 10/08/17   Rogene Houston, MD  hydrocortisone-pramoxine (PROCTOFOAM [facility]) rectal foam Place 1 applicator rectally  2 (two) times daily. 05/15/18   Rush Salce S, PA-C  ondansetron (ZOFRAN) 4 MG tablet Take 1 tablet (4 mg total) by mouth every 8 (eight) hours as needed for nausea or vomiting. 08/26/17 08/26/18  Virl Cagey, MD  oxyCODONE-acetaminophen (PERCOCET) 10-325 MG tablet Take 1 tablet by mouth every 6 (six) hours as needed for pain. 08/26/17   Virl Cagey, MD  propranolol ER (INDERAL LA) 60 MG 24 hr capsule Take 1 capsule (60 mg total) by mouth daily. 10/08/17   Rogene Houston, MD    tetrahydrozoline 0.05 % ophthalmic solution Place into both eyes daily as needed (dryness).    [provider]    Family History Family History  Problem Relation Age of Onset  . Colon cancer Unknown        grandmother  . Colon cancer Paternal Grandmother     Social History Social History   Tobacco Use  . Smoking status: Current Every Day Smoker    Packs/day: 1.00    Years: 7.00    Pack years: 7.00    Types: Cigarettes  . Smokeless tobacco: Never Used  Substance Use Topics  . Alcohol use: Yes    Alcohol/week: 0.0 standard drinks    Comment: once or twice per week  . Drug use: No     Allergies   Patient has no known allergies.   Review of Systems Review of Systems  Constitutional: Negative for chills and fever.  Respiratory: Negative for shortness of breath.   Cardiovascular: Negative for chest pain.  Gastrointestinal: Positive for rectal pain. Negative for abdominal pain, constipation, diarrhea, nausea and vomiting.    Physical Exam Updated Vital Signs BP 114/67 (BP Location: Right Arm)   Pulse 87   Temp 98.4 F (36.9 C) (Oral)   Resp 20   SpO2 99%   Physical Exam  Constitutional: He is oriented to person, place, and time. He appears well-developed and well-nourished. No distress.  Eyes: Conjunctivae are normal.  Cardiovascular: Normal rate and regular rhythm.  Pulmonary/Chest: Effort normal and breath sounds normal.  Abdominal: Soft. There is no tenderness.  Genitourinary:  Genitourinary Comments: Chaperone present during exam. DRE with no evidence of internal hemorrhoids or fluctuant area that would suggest abscess. There is a 1cm x 1cm swollen area consistent with hemorrhoid. Area has previously been lanced and there is a small open wound. No surrounding erythema or signs of infection.  Neurological: He is alert and oriented to person, place, and time.  Skin: Skin is warm and dry.     ED Treatments / Results  Labs (all labs ordered are  listed, but only abnormal results are displayed) Labs Reviewed - No data to display  EKG None  Radiology No results found.  Procedures Procedures (including critical care time)  Medications Ordered in ED Medications  hydrocortisone-pramoxine (PROCTOFOAM-HC) rectal foam 1 applicator (has no administration in time range)     Initial Impression / Assessment and Plan / ED Course  I have reviewed the triage vital signs and the nursing notes.  Pertinent labs & imaging results that were available during my care of the patient were reviewed by me and considered in my medical decision making (see chart for details).     Final Clinical Impressions(s) / ED Diagnoses   Final diagnoses:  External hemorrhoids   Patient presents the ED today complaining of hemorrhoid pain.  Recently had the area lanced by an outside hospital.  No fevers or chills.  Has tried sitz  bath and ibuprofen with no significant relief.  On exam has evidence of an external hemorrhoid that is tender to palpation with a central area of opening consistent with history of recent I&D.  Will give patient Proctofoam and advised continued Tylenol and ibuprofen.  Advised continuing sitz bath multiple times daily.  Advised to return to the ER for new or worsening symptoms and to follow-up with general surgery as scheduled next week.  He voices understanding the plan reasons to return to ED.  All questions answered.  ED Discharge Orders         Ordered    hydrocortisone-pramoxine [facility]) rectal foam  2 times daily     05/15/18 1744           Indigo Barbian S, PA-C 05/15/18 1744    Orlie Dakin, MD 05/16/18 0111

## 2018-05-15 NOTE — ED Triage Notes (Signed)
Patient to ED c/o pain from external hemorrhoid - had it lanced yesterday at [facility] and told to f/u with surgeon, who he states can't see him until next week.

## 2018-05-15 NOTE — Discharge Instructions (Addendum)
Please take medications as directed. You may use the Proctofoam 2 times daily.  Continue sitz bath multiple times daily.  Please follow-up with general surgery as already scheduled next week.  Return to the emergency department for any new or worsening symptoms in the meantime.

## 2018-05-15 NOTE — ED Provider Notes (Signed)
Patient placed in Quick Look pathway, seen and evaluated   Chief Complaint: hemorrhoids  HPI: [name] [name] is a 32 y.o. male who presents to the ED with hemorrhoid pain. Patient reports going to the ED in [facility] yesterday and they lanced the hemorrhoid and gave him f/u to a surgeon. Patient reports the surgeon can not see him until next week and the pain was so bad he came here today.   ROS: GU: rectal pain  Physical Exam:    Gen: No distress  Neuro: Awake and Alert  Skin: Warm and dry   Initiation of care has begun. The patient has been counseled on the process, plan, and necessity for staying for the completion/evaluation, and the remainder of the medical screening examination    Ashley Murrain, NP 05/15/18 1625    Isla Pence, MD 05/15/18 1655

## 2018-05-20 DIAGNOSIS — K645 Perianal venous thrombosis: Secondary | ICD-10-CM | POA: Diagnosis not present

## 2018-05-22 ENCOUNTER — Ambulatory Visit: Payer: 59 | Admitting: General Surgery

## 2018-08-11 DIAGNOSIS — Z1389 Encounter for screening for other disorder: Secondary | ICD-10-CM | POA: Diagnosis not present

## 2018-08-11 DIAGNOSIS — M779 Enthesopathy, unspecified: Secondary | ICD-10-CM | POA: Diagnosis not present

## 2018-08-11 DIAGNOSIS — E663 Overweight: Secondary | ICD-10-CM | POA: Diagnosis not present

## 2018-08-11 DIAGNOSIS — Z6829 Body mass index (BMI) 29.0-29.9, adult: Secondary | ICD-10-CM | POA: Diagnosis not present

## 2019-03-19 ENCOUNTER — Other Ambulatory Visit: Payer: Self-pay | Admitting: Internal Medicine

## 2019-03-19 ENCOUNTER — Other Ambulatory Visit (HOSPITAL_COMMUNITY): Payer: Self-pay | Admitting: Internal Medicine

## 2019-03-19 DIAGNOSIS — M5126 Other intervertebral disc displacement, lumbar region: Secondary | ICD-10-CM

## 2019-03-30 ENCOUNTER — Other Ambulatory Visit: Payer: Self-pay

## 2019-03-30 ENCOUNTER — Ambulatory Visit (HOSPITAL_COMMUNITY)
Admission: RE | Admit: 2019-03-30 | Discharge: 2019-03-30 | Disposition: A | Discharge status: Home / Self Care | Payer: No Typology Code available for payment source | Source: Ambulatory Visit | Attending: Internal Medicine | Admitting: Internal Medicine

## 2019-03-30 DIAGNOSIS — M5126 Other intervertebral disc displacement, lumbar region: Secondary | ICD-10-CM | POA: Insufficient documentation

## 2019-04-30 IMAGING — CT CT ENTEROGRAPHY (ABD-PELV W/ CM)
2 of 10 series · 11 of 46 positions shown, 17 images · IV contrast (Isovue)
Comparison: 07/19/2017 and 07/17/2017

CLINICAL DATA: Chronic left upper quadrant and epigastric pain for
several years, with worsening over last 3 weeks. Nausea and
vomiting. Diarrhea. Recent CT showing proximal small bowel wall
thickening and intussusception.

EXAM:
CT ABDOMEN AND PELVIS WITH CONTRAST (ENTEROGRAPHY)
TECHNIQUE: Multidetector CT of the abdomen and pelvis during bolus
administration of intravenous contrast. Negative oral contrast was
given.
CONTRAST:  125mL S2GNSC-6II IOPAMIDOL (S2GNSC-6II) INJECTION 61%

[Series 5: entero thins · axial · 0.70mm/px · z∈[+1276,+1446]mm · 8 of 111 slices shown, 13 images]
[im 13/111  soft-tissue]
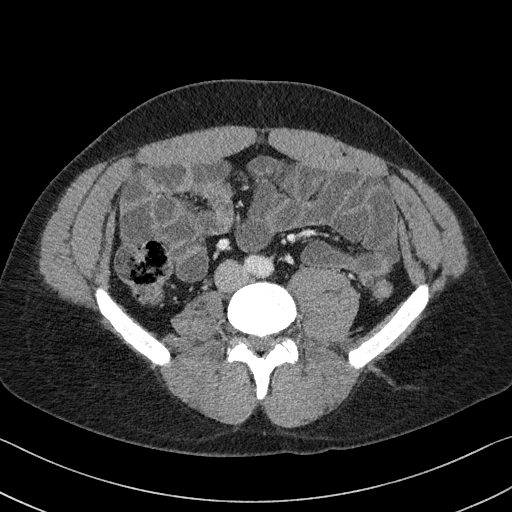
[im 13/111  bone]
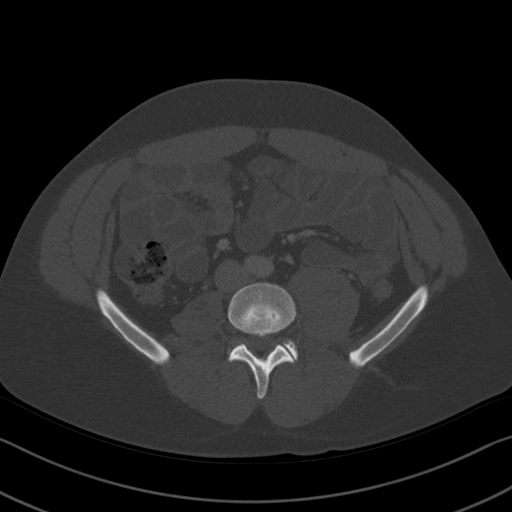
[im 25/111  soft-tissue]
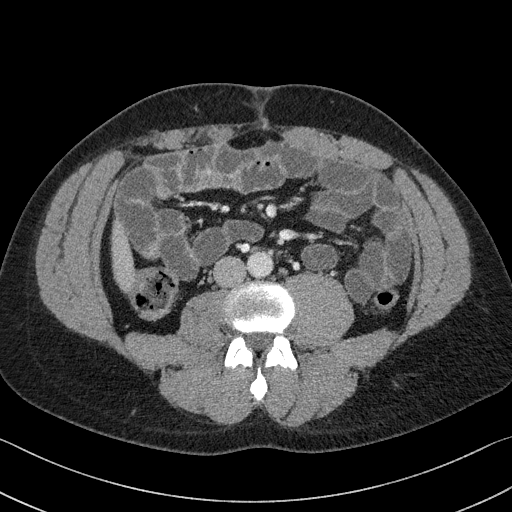
[im 37/111  soft-tissue]
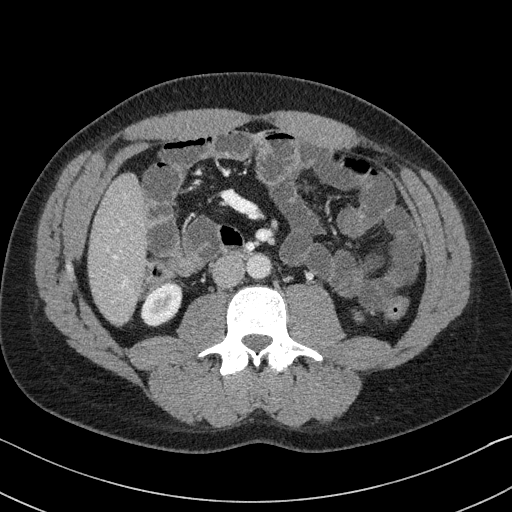
[im 49/111  soft-tissue]
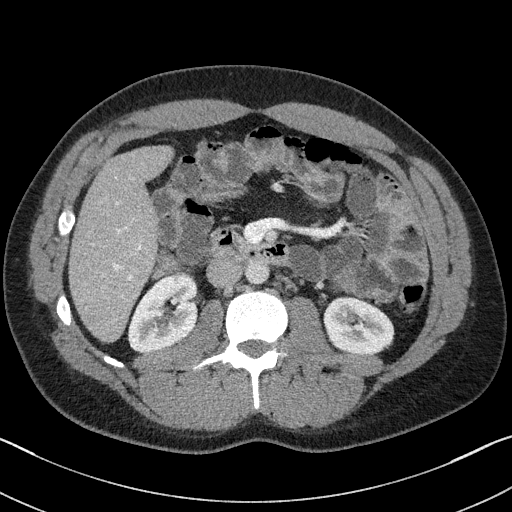
[im 62/111  soft-tissue]
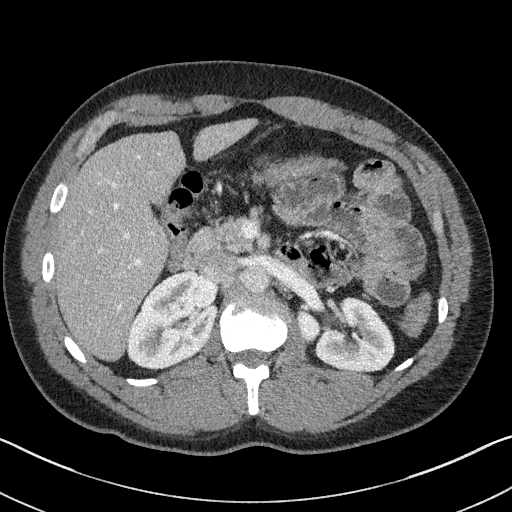
[im 62/111  lung]
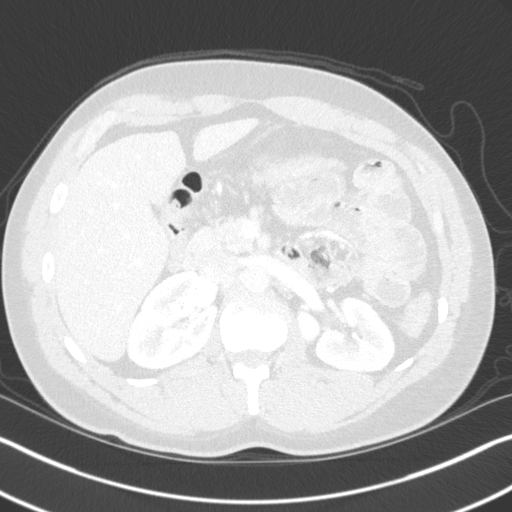
[im 74/111  soft-tissue]
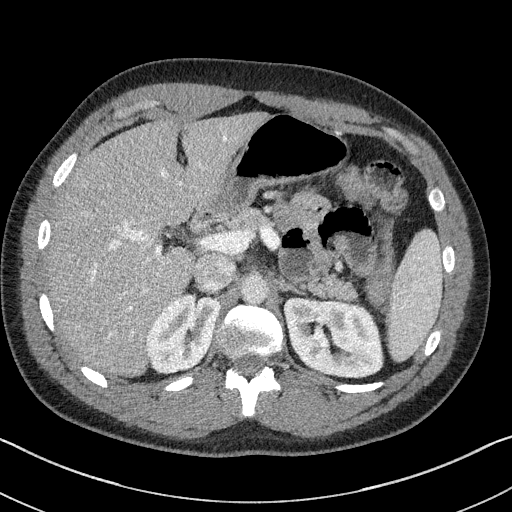
[im 74/111  lung]
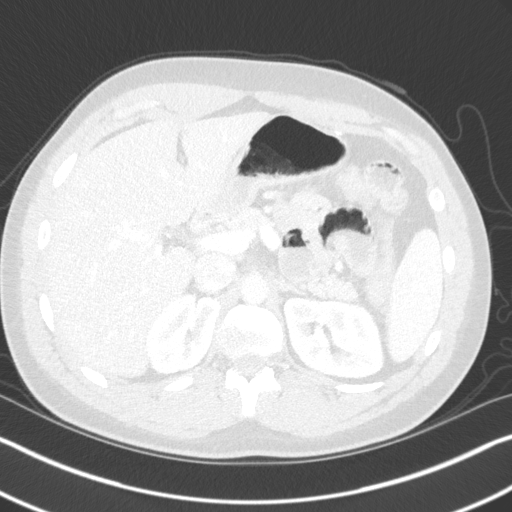
[im 86/111  soft-tissue]
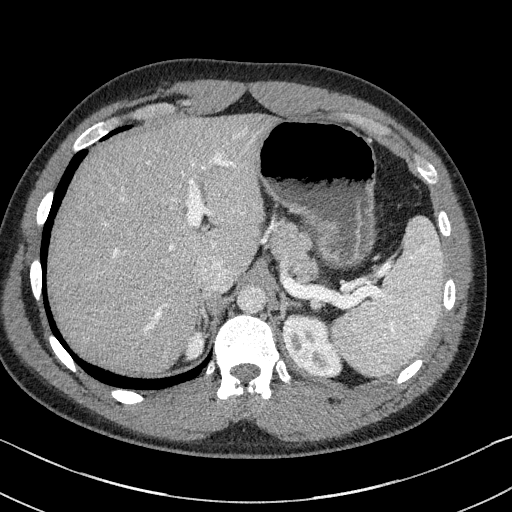
[im 86/111  lung]
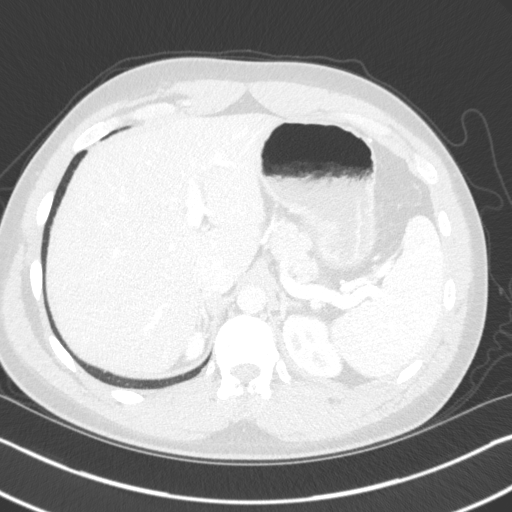
[im 98/111  soft-tissue]
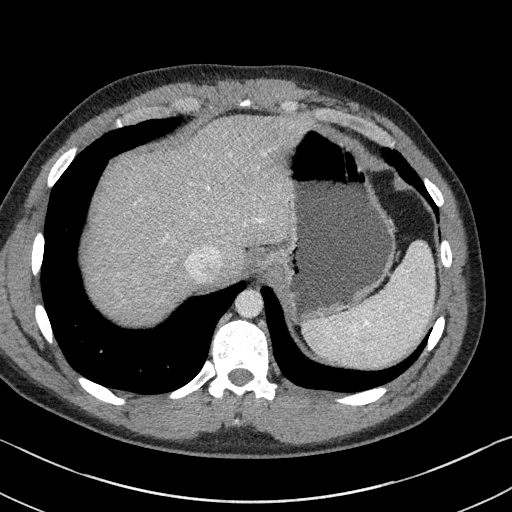
[im 98/111  lung]
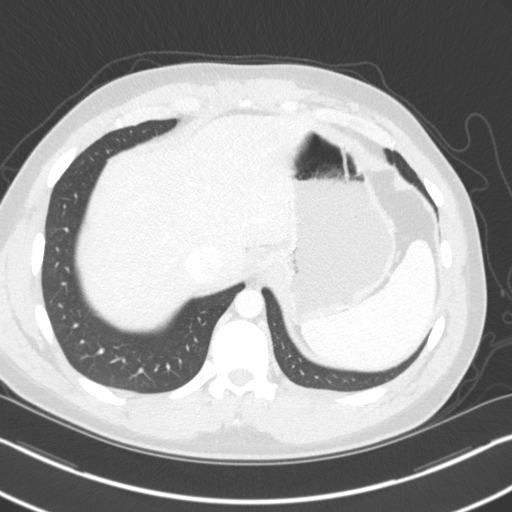

[Series 10: coronal · coronal · 0.44mm/px · 3 of 101 slices shown, 4 images]
[im 26/101  soft-tissue]
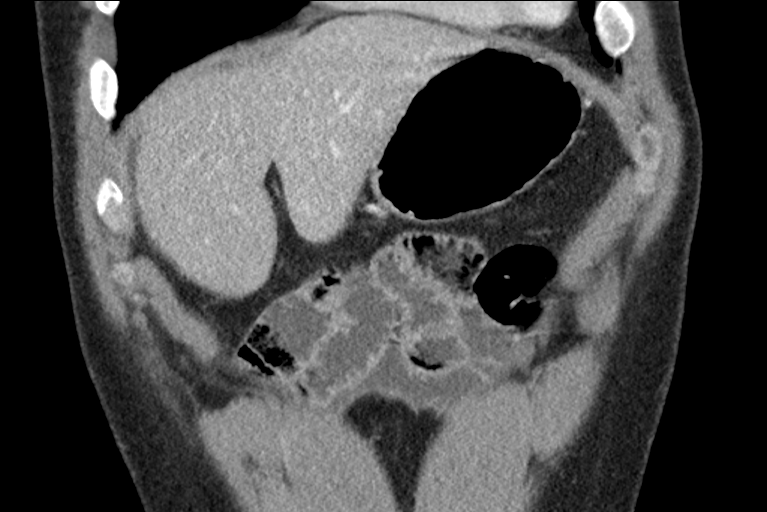
[im 51/101  soft-tissue]
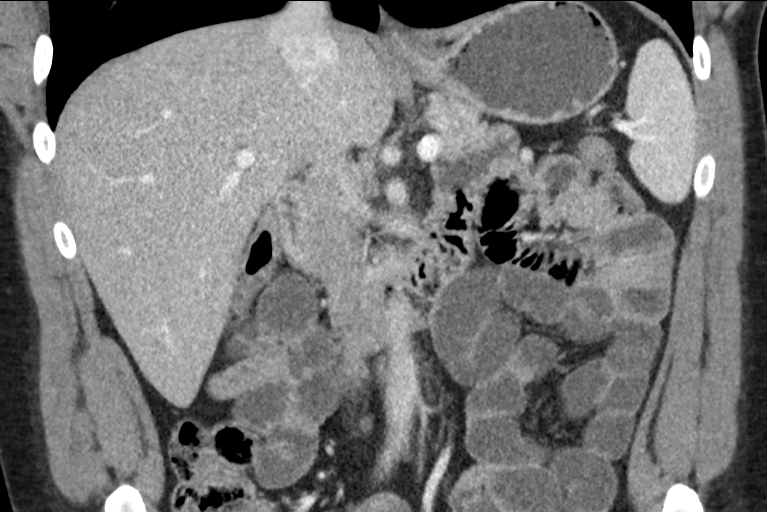
[im 51/101  bone]
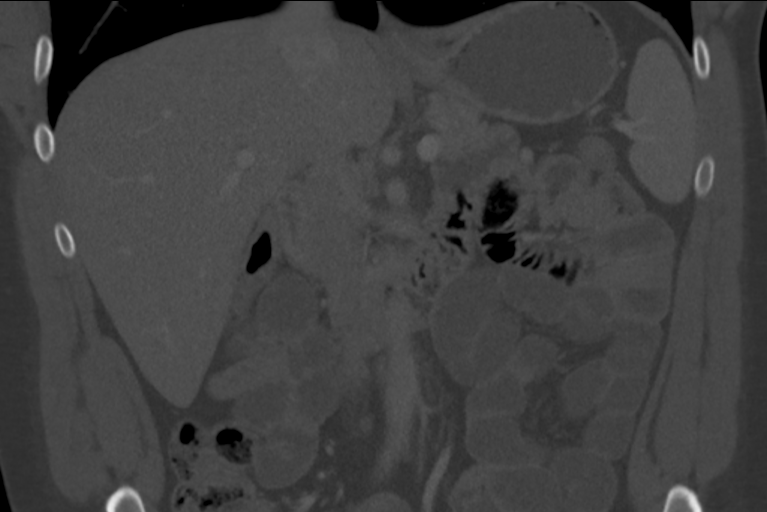
[im 76/101  soft-tissue]
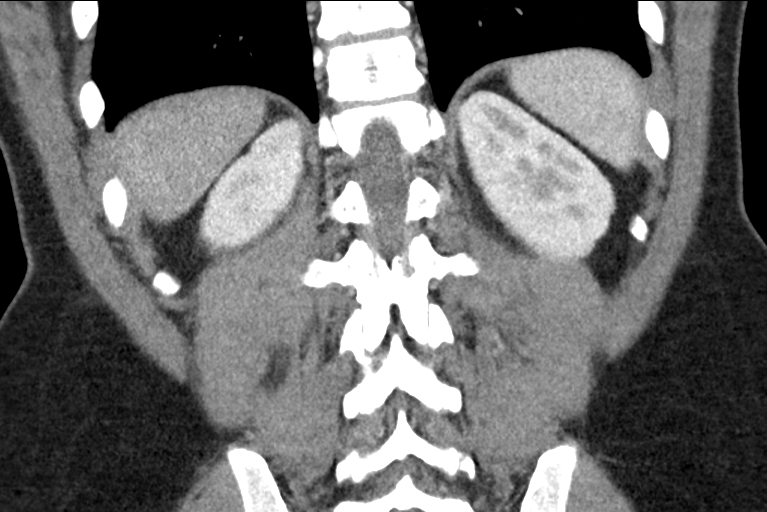

[11 of 46 positions shown; findings below may reference images not displayed]

FINDINGS: Lower Chest: No acute findings.

Hepatobiliary: No hepatic masses identified. Prior cholecystectomy.
No evidence of biliary obstruction.

Pancreas:  No mass or inflammatory changes.

Spleen: Within normal limits in size and appearance.

Adrenals/Urinary Tract: No masses identified. No evidence of
hydronephrosis.

Stomach/Bowel: No evidence of small bowel wall thickening or
dilatation on today's exam. Previously seen short segment
jejunojejunal intussusception is no longer visualized. No evidence
of abnormal mucosal enhancement or mesenteric inflammatory changes.
No evidence of enteric fistula, extraluminal gas or abnormal fluid
collections. The terminal ileum is normal in appearance. No other
areas of bowel wall thickening identified. Normal appendix
visualized.

Vascular/Lymphatic: No pathologically enlarged lymph nodes. No
abdominal aortic aneurysm.

Reproductive:  No mass or other significant abnormality.

Other:  No evidence of ascites.

Musculoskeletal:  No suspicious bone lesions identified.
IMPRESSION: Negative. No radiographic evidence of inflammatory bowel disease,
intussusception, or other significant abnormality.

## 2023-03-22 DIAGNOSIS — M9902 Segmental and somatic dysfunction of thoracic region: Secondary | ICD-10-CM | POA: Diagnosis not present

## 2023-03-22 DIAGNOSIS — M9901 Segmental and somatic dysfunction of cervical region: Secondary | ICD-10-CM | POA: Diagnosis not present

## 2023-03-22 DIAGNOSIS — M546 Pain in thoracic spine: Secondary | ICD-10-CM | POA: Diagnosis not present

## 2023-03-22 DIAGNOSIS — M542 Cervicalgia: Secondary | ICD-10-CM | POA: Diagnosis not present

## 2023-03-25 DIAGNOSIS — M9902 Segmental and somatic dysfunction of thoracic region: Secondary | ICD-10-CM | POA: Diagnosis not present

## 2023-03-25 DIAGNOSIS — M9901 Segmental and somatic dysfunction of cervical region: Secondary | ICD-10-CM | POA: Diagnosis not present

## 2023-03-25 DIAGNOSIS — M542 Cervicalgia: Secondary | ICD-10-CM | POA: Diagnosis not present

## 2023-03-25 DIAGNOSIS — M546 Pain in thoracic spine: Secondary | ICD-10-CM | POA: Diagnosis not present

## 2024-07-14 ENCOUNTER — Other Ambulatory Visit: Payer: Self-pay | Admitting: Family Medicine

## 2024-07-14 DIAGNOSIS — R0683 Snoring: Secondary | ICD-10-CM | POA: Diagnosis not present

## 2024-07-14 DIAGNOSIS — L570 Actinic keratosis: Secondary | ICD-10-CM | POA: Diagnosis not present

## 2024-07-14 DIAGNOSIS — E6609 Other obesity due to excess calories: Secondary | ICD-10-CM | POA: Diagnosis not present

## 2024-07-14 DIAGNOSIS — Z6830 Body mass index (BMI) 30.0-30.9, adult: Secondary | ICD-10-CM | POA: Diagnosis not present

## 2024-07-14 DIAGNOSIS — Z13220 Encounter for screening for lipoid disorders: Secondary | ICD-10-CM | POA: Diagnosis not present

## 2024-07-14 DIAGNOSIS — E66811 Obesity, class 1: Secondary | ICD-10-CM | POA: Diagnosis not present

## 2024-07-14 DIAGNOSIS — R29818 Other symptoms and signs involving the nervous system: Secondary | ICD-10-CM | POA: Diagnosis not present

## 2024-07-14 DIAGNOSIS — Z0001 Encounter for general adult medical examination with abnormal findings: Secondary | ICD-10-CM | POA: Diagnosis not present

## 2024-07-14 DIAGNOSIS — R400 Somnolence: Secondary | ICD-10-CM | POA: Diagnosis not present

## 2024-07-16 LAB — DERMATOLOGY PATHOLOGY
